# Patient Record
Sex: Male | Born: 1940 | Race: Black or African American | Hispanic: No | Marital: Single | Smoking: Former smoker
Health system: Southern US, Community
[De-identification: ages and names within clinical notes are randomized; demographics above are authoritative.]

## PROBLEM LIST (undated history)

## (undated) DIAGNOSIS — F329 Major depressive disorder, single episode, unspecified: Secondary | ICD-10-CM

## (undated) DIAGNOSIS — I10 Essential (primary) hypertension: Secondary | ICD-10-CM

## (undated) DIAGNOSIS — E785 Hyperlipidemia, unspecified: Secondary | ICD-10-CM

## (undated) DIAGNOSIS — I420 Dilated cardiomyopathy: Secondary | ICD-10-CM

## (undated) DIAGNOSIS — I472 Ventricular tachycardia, unspecified: Secondary | ICD-10-CM

## (undated) DIAGNOSIS — I82629 Acute embolism and thrombosis of deep veins of unspecified upper extremity: Secondary | ICD-10-CM

## (undated) DIAGNOSIS — G4733 Obstructive sleep apnea (adult) (pediatric): Secondary | ICD-10-CM

## (undated) DIAGNOSIS — N182 Chronic kidney disease, stage 2 (mild): Secondary | ICD-10-CM

## (undated) DIAGNOSIS — M1990 Unspecified osteoarthritis, unspecified site: Secondary | ICD-10-CM

## (undated) DIAGNOSIS — E119 Type 2 diabetes mellitus without complications: Secondary | ICD-10-CM

## (undated) DIAGNOSIS — F32A Depression, unspecified: Secondary | ICD-10-CM

## (undated) HISTORY — DX: Essential (primary) hypertension: I10

## (undated) HISTORY — DX: Obstructive sleep apnea (adult) (pediatric): G47.33

## (undated) HISTORY — PX: CARPAL TUNNEL RELEASE: SHX101

## (undated) HISTORY — PX: CATARACT EXTRACTION W/ INTRAOCULAR LENS  IMPLANT, BILATERAL: SHX1307

## (undated) HISTORY — DX: Hyperlipidemia, unspecified: E78.5

## (undated) HISTORY — PX: TOTAL HIP ARTHROPLASTY: SHX124

## (undated) HISTORY — DX: Chronic kidney disease, stage 2 (mild): N18.2

## (undated) HISTORY — PX: BUNIONECTOMY: SHX129

## (undated) HISTORY — DX: Dilated cardiomyopathy: I42.0

## (undated) HISTORY — PX: TONSILLECTOMY AND ADENOIDECTOMY: SUR1326

## (undated) HISTORY — DX: Type 2 diabetes mellitus without complications: E11.9

## (undated) HISTORY — PX: TOTAL KNEE ARTHROPLASTY: SHX125

---
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
## 2002-11-22 ENCOUNTER — Inpatient Hospital Stay (HOSPITAL_COMMUNITY): Admission: RE | Admit: 2002-11-22 | Discharge: 2002-11-23 | Payer: Self-pay | Admitting: Cardiology

## 2003-11-20 ENCOUNTER — Inpatient Hospital Stay (HOSPITAL_COMMUNITY): Admission: RE | Admit: 2003-11-20 | Discharge: 2003-11-24 | Payer: Self-pay | Admitting: Orthopedic Surgery

## 2003-11-20 ENCOUNTER — Ambulatory Visit: Payer: Self-pay | Admitting: Physical Medicine & Rehabilitation

## 2003-11-24 ENCOUNTER — Ambulatory Visit: Payer: Self-pay | Admitting: Physical Medicine & Rehabilitation

## 2003-11-24 ENCOUNTER — Inpatient Hospital Stay
Admission: RE | Admit: 2003-11-24 | Discharge: 2003-11-30 | Payer: Self-pay | Admitting: Physical Medicine & Rehabilitation

## 2003-12-11 ENCOUNTER — Ambulatory Visit: Payer: Self-pay | Admitting: Cardiology

## 2004-02-05 ENCOUNTER — Ambulatory Visit: Payer: Self-pay | Admitting: Cardiology

## 2004-03-06 ENCOUNTER — Ambulatory Visit: Payer: Self-pay | Admitting: Internal Medicine

## 2004-03-06 ENCOUNTER — Inpatient Hospital Stay (HOSPITAL_COMMUNITY): Admission: RE | Admit: 2004-03-06 | Discharge: 2004-03-14 | Payer: Self-pay | Admitting: Orthopedic Surgery

## 2004-05-08 ENCOUNTER — Ambulatory Visit: Payer: Self-pay | Admitting: Cardiology

## 2005-01-06 ENCOUNTER — Ambulatory Visit: Payer: Self-pay | Admitting: Cardiology

## 2005-01-16 ENCOUNTER — Ambulatory Visit: Payer: Self-pay | Admitting: Cardiology

## 2005-01-17 ENCOUNTER — Ambulatory Visit: Payer: Self-pay | Admitting: Cardiology

## 2005-02-10 ENCOUNTER — Ambulatory Visit: Payer: Self-pay | Admitting: Cardiology

## 2005-11-15 ENCOUNTER — Encounter: Payer: Self-pay | Admitting: Cardiology

## 2005-11-26 ENCOUNTER — Ambulatory Visit: Payer: Self-pay | Admitting: Cardiology

## 2006-09-16 ENCOUNTER — Ambulatory Visit: Payer: Self-pay | Admitting: Cardiology

## 2006-09-30 ENCOUNTER — Ambulatory Visit: Payer: Self-pay | Admitting: Cardiology

## 2007-11-19 ENCOUNTER — Ambulatory Visit: Payer: Self-pay | Admitting: Cardiology

## 2008-05-16 ENCOUNTER — Ambulatory Visit: Payer: Self-pay | Admitting: Cardiology

## 2008-05-25 ENCOUNTER — Ambulatory Visit: Payer: Self-pay | Admitting: Cardiology

## 2008-05-29 ENCOUNTER — Encounter: Payer: Self-pay | Admitting: Cardiology

## 2008-05-31 ENCOUNTER — Encounter: Payer: Self-pay | Admitting: Cardiology

## 2008-11-30 ENCOUNTER — Encounter (INDEPENDENT_AMBULATORY_CARE_PROVIDER_SITE_OTHER): Payer: Self-pay | Admitting: *Deleted

## 2008-11-30 ENCOUNTER — Ambulatory Visit (HOSPITAL_COMMUNITY): Admission: RE | Admit: 2008-11-30 | Discharge: 2008-11-30 | Payer: Self-pay | Admitting: Ophthalmology

## 2008-12-01 ENCOUNTER — Ambulatory Visit: Payer: Self-pay | Admitting: Cardiology

## 2008-12-01 DIAGNOSIS — M109 Gout, unspecified: Secondary | ICD-10-CM

## 2008-12-01 DIAGNOSIS — I428 Other cardiomyopathies: Secondary | ICD-10-CM

## 2008-12-01 DIAGNOSIS — I10 Essential (primary) hypertension: Secondary | ICD-10-CM

## 2008-12-01 DIAGNOSIS — N182 Chronic kidney disease, stage 2 (mild): Secondary | ICD-10-CM

## 2008-12-01 DIAGNOSIS — E785 Hyperlipidemia, unspecified: Secondary | ICD-10-CM

## 2008-12-01 DIAGNOSIS — G4733 Obstructive sleep apnea (adult) (pediatric): Secondary | ICD-10-CM

## 2009-02-08 ENCOUNTER — Ambulatory Visit (HOSPITAL_COMMUNITY): Admission: RE | Admit: 2009-02-08 | Discharge: 2009-02-08 | Payer: Self-pay | Admitting: Ophthalmology

## 2009-06-08 ENCOUNTER — Ambulatory Visit: Payer: Self-pay | Admitting: Cardiology

## 2009-06-08 DIAGNOSIS — J309 Allergic rhinitis, unspecified: Secondary | ICD-10-CM | POA: Insufficient documentation

## 2009-07-29 ENCOUNTER — Encounter: Payer: Self-pay | Admitting: Cardiology

## 2009-08-03 ENCOUNTER — Encounter: Payer: Self-pay | Admitting: Cardiology

## 2009-12-06 ENCOUNTER — Ambulatory Visit: Payer: Self-pay | Admitting: Cardiology

## 2009-12-06 DIAGNOSIS — I5022 Chronic systolic (congestive) heart failure: Secondary | ICD-10-CM

## 2010-01-20 DIAGNOSIS — I82629 Acute embolism and thrombosis of deep veins of unspecified upper extremity: Secondary | ICD-10-CM

## 2010-01-20 HISTORY — DX: Acute embolism and thrombosis of deep veins of unspecified upper extremity: I82.629

## 2010-02-19 NOTE — Letter (Signed)
Summary: [facility] D/C DR. Beatrix Fetters [facility]  [facility] D/C DR. Kirstie Peri   Imported By: Zachary George 12/06/2009 13:35:29  _____________________________________________________________________  External Attachment:    Type:   Image     Comment:   External Document

## 2010-02-19 NOTE — Assessment & Plan Note (Signed)
Summary: 6 mo ful fholt   Visit Type:  Follow-up Primary Provider:  Sherryll Burger   History of Present Illness: patient presents for a six-month followup.  Since last seen, he suffered a fall this past July, resulting in left pelvis fracture, treated conservatively. He denied any syncope.  Clinically, he has lost 4 pounds since his last visit. He denies dietary sodium indiscretion, but does drink water freely. He denies any significant exacerbation from baseline level of exercise tolerance. His symptoms are consistent with NYHA class II heart failure.  Patient remains noncompliant with CPAP.  Preventive Screening-Counseling & Management  Alcohol-Tobacco     Smoking Status: quit     Year Quit: 1984  Current Medications (verified): 1)  Coreg 25 Mg Tabs (Carvedilol) .... Take 1 Tablet By Mouth Twice A Day 2)  Lasix 40 Mg Tabs (Furosemide) .... Take 1 Tablet By Mouth Every Morning (Please Deliver To Home) 3)  Lisinopril 40 Mg Tabs (Lisinopril) .... Take 1 Tablet By Mouth Once A Day 4)  Pravastatin Sodium 40 Mg Tabs (Pravastatin Sodium) .... Take 1 Tablet By Mouth Once A Day 5)  Aspirin 81 Mg Tbec (Aspirin) .... Take One Tablet By Mouth Daily 6)  Cymbalta 30 Mg Cpep (Duloxetine Hcl) .... Take 1 Tablet By Mouth Once A Day 7)  Avodart 0.5 Mg Caps (Dutasteride) .... Take 1 Capsule By Mouth Once A Day 8)  Antacid Anti-Gas 200-200-20 Mg/51ml Susp (Alum & Mag Hydroxide-Simeth) .... Take As Needed 9)  Colcrys 0.6 Mg Tabs (Colchicine) .... Take 1 Tablet By Mouth Two Times A Day As Needed 10)  Cetirizine Hcl 10 Mg Tabs (Cetirizine Hcl) .... Take 1 Tablet By Mouth Once A Day 11)  Norvasc 10 Mg Tabs (Amlodipine Besylate) .... Take 1 Tablet By Mouth Once A Day 12)  Vicodin 5-500 Mg Tabs (Hydrocodone-Acetaminophen) .... Take 1 Tablet By Mouth Two Times A Day As Needed  Allergies (verified): 1)  ! Coumadin  Comments:  Nurse/Medical Assistant: The patient's medication bottles and allergies were reviewed  with the patient and were updated in the Medication and Allergy Lists.  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   mild renal insufficiency status post catheterization in 2004 nonobstructive disease. Hyperkalemia secondary to medications mild to insufficiency hypertension obstructive sleep apnea Exline dyslipidemia  1. 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 2. Mild renal insufficiency. 3. The patient had normal catheterization in 2004. 4. Possible false positive Cardiolite stress study recently. 5. Hypertension controlled. 6. Obstructive sleep apnea. 7. Dyslipidemia. 8. Borderline diabetes mellitus.   Review of Systems       No fevers, chills, hemoptysis, dysphagia, melena, hematocheezia, hematuria, rash, claudication, orthopnea, pnd, pedal edema. All other systems negative.   Vital Signs:  Patient profile:   70 year old male Height:      72 inches Weight:      344 pounds BMI:     46.82 Pulse rate:   79 / minute BP sitting:   103 / 70  (left arm) Cuff size:   large  Vitals Entered By: Carlye Grippe (December 06, 2009 10:17 AM)  Nutrition Counseling: Patient's BMI is greater than 25 and therefore counseled on weight management options.  Physical Exam  Additional Exam:  GEN: 70 year old male, morbidly obese, sitting upright, no distress HEENT: NCAT,PERRLA,EOMI NECK: palpable pulses, no bruits; unable to assess JVD, secondary to neck girth LUNGS: diminished breath sounds, but  no crackles or wheezes HEART: RRR (S1S2); no significant murmurs; no rubs; no gallops ABD: soft, NT; intact BS EXT: 1+ peripheral, nonpitting edema SKIN: warm, dry MUSC: no obvious deformity NEURO: A/O (x3)     Impression & Recommendations:  Problem # 1:  CARDIOMYOPATHY, DILATED (ICD-425.4)  patient is euvolemic by clinical history and  presentation, with symptoms suggestive of NYHA class II heart failure. He has lost 4 pounds since his last visit. Most recent echo indicated stable moderate LVD (EF 35-40%). Continue current diuretic regimen. I also recommended cardiac rehabilitation, from which I feel he would greatly benefit. He said he would consider this.  Problem # 2:  ESSENTIAL HYPERTENSION, BENIGN (ICD-401.1)  much improved, following addition of amlodipine, by Dr. Andee Lineman, at time of last visit.  Problem # 3:  OBSTRUCTIVE SLEEP APNEA (ICD-327.23)  admits to remaining noncompliant with CPAP.  Other Orders: EKG w/ Interpretation (93000) Cardiac Rehabilitation (Cardiac Rehab)  Patient Instructions: 1)  Cardiac Rehab 2)  weight daily 3)  no added salt 4)  Follow up in  6 months

## 2010-02-19 NOTE — Assessment & Plan Note (Signed)
Summary: 6 mt followup.rcm   Visit Type:  Follow-up Primary Provider:  Sherryll Burger   History of Present Illness: the patient is a 70 year old male with a history of moderate coronary artery disease.  He has a dilated cardiomyopathy.  His ejection fraction is 35 to 40%.  He had a recent echocardiogram and his ejection fraction remains stable.  He is currently in NYHA class two.  He remains grossly overweight.  He also remains noncompliant with the CPAP device.  He has significant sleep apnea.  He complains of sinus drainage and muscle pains.  From a cardiac standpoint however he remained stable.  Unfortunately blood pressure again is very elevated.  The latter is likely contravening to his nonischemic cardiomyopathy.  He reports no orthopnea PND he has no palpitations or syncope  Preventive Screening-Counseling & Management  Alcohol-Tobacco     Smoking Status: quit     Year Quit: 1984  Current Medications (verified): 1)  Coreg 25 Mg Tabs (Carvedilol) .... Take 1 Tablet By Mouth Twice A Day 2)  Lasix 40 Mg Tabs (Furosemide) .... Take 1 Tablet By Mouth Every Morning (Please Deliver To Home) 3)  Lisinopril 40 Mg Tabs (Lisinopril) .... Take 1 Tablet By Mouth Once A Day 4)  Pravastatin Sodium 40 Mg Tabs (Pravastatin Sodium) .... Take 1 Tablet By Mouth Once A Day 5)  Aspirin 81 Mg Tbec (Aspirin) .... Take One Tablet By Mouth Daily 6)  Cymbalta 30 Mg Cpep (Duloxetine Hcl) .... Take 1 Tablet By Mouth Once A Day 7)  Avodart 0.5 Mg Caps (Dutasteride) .... Take 1 Capsule By Mouth Once A Day 8)  Antacid Anti-Gas 200-200-20 Mg/28ml Susp (Alum & Mag Hydroxide-Simeth) .... Take As Needed 9)  Colcrys 0.6 Mg Tabs (Colchicine) .... Take 1 Tablet By Mouth Two Times A Day As Needed 10)  Cetirizine Hcl 10 Mg Tabs (Cetirizine Hcl) .... Take 1 Tablet By Mouth Once A Day 11)  Zyrtec Allergy 10 Mg Caps (Cetirizine Hcl) .... Take 1 Tablet By Mouth Once A Day 12)  Norvasc 10 Mg Tabs (Amlodipine Besylate) .... Take 1 Tablet  By Mouth Once A Day  Allergies (verified): 1)  ! Coumadin  Comments:  Nurse/Medical Assistant: The patient's medications and allergies were reviewed with the patient and were updated in the Medication and Allergy Lists. List reviewed.  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   mild renal insufficiency status post catheterization in 2004 nonobstructive disease. Hyperkalemia secondary to medications mild to insufficiency hypertension obstructive sleep apnea Exline dyslipidemia  1. 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 2. Mild renal insufficiency. 3. The patient had normal catheterization in 2004. 4. Possible false positive Cardiolite stress study recently. 5. Hypertension controlled. 6. Obstructive sleep apnea. 7. Dyslipidemia. 8. Borderline diabetes mellitus.   Past Surgical History: Last updated: 11/18/2007 appendectomy, bunionectomy, carpal tunnel surgery of right hand, left hip replacement.  Family History: Last updated: 11/18/2007 Negative FH of Diabetes, Hypertension, or Coronary Artery Disease  Social History: Last updated: 11/18/2007 Tobacco Use - No.   Social History: Smoking Status:  quit  Review of Systems       The patient complains of shortness of breath and leg swelling.  The patient denies fatigue, malaise, fever, weight gain/loss, vision loss, decreased hearing, hoarseness, chest pain, palpitations, prolonged cough, wheezing, sleep apnea, coughing up blood, abdominal pain, blood in stool, nausea, vomiting, diarrhea, heartburn, incontinence, blood in urine,  muscle weakness, joint pain, rash, skin lesions, headache, fainting, dizziness, depression, anxiety, enlarged lymph nodes, easy bruising or bleeding, and environmental allergies.    Vital Signs:  Patient profile:   70 year old male Height:      72  inches Weight:      348 pounds O2 Sat:      96 % Pulse rate:   64 / minute BP sitting:   151 / 100  (left arm) Cuff size:   large  Vitals Entered By: Carlye Grippe (Jun 08, 2009 10:04 AM)  Physical Exam  Additional Exam:  General: Well-developed, well-nourished in no distress head: Normocephalic and atraumatic eyes PERRLA/EOMI intact, conjunctiva and lids normal nose: No deformity or lesions mouth normal dentition, normal posterior pharynx neck: Supple, no JVD.  No masses, thyromegaly or abnormal cervical nodes lungs: Normal breath sounds bilaterally without wheezing.  Normal percussion heart: regular rate and rhythm with normal S1 and S2, no S3 or S4.  PMI is normal.  No pathological murmurs abdomen: Normal bowel sounds, abdomen is soft and nontender without masses, organomegaly or hernias noted.  No hepatosplenomegaly musculoskeletal: Back normal, normal gait muscle strength and tone normal pulsus: Pulse is normal in all 4 extremities Extremities: No peripheral pitting edema neurologic: Alert and oriented x 3 skin: Intact without lesions or rashes cervical nodes: No significant adenopathy psychologic: Normal affect    Impression & Recommendations:  Problem # 1:  OBSTRUCTIVE SLEEP APNEA (ICD-327.23) noncompliant with CPAP  Problem # 2:  CARDIOMYOPATHY, DILATED (ICD-425.4) ejection fraction remains stable at 35 to 40%.  No clear indication for ICD.  Particular because the patient blood pressure remains elevated and is atargets for  for treatment The following medications were removed from the medication list:    Aspir-low 81 Mg Tbec (Aspirin) .Marland Kitchen... Take 1 tab daily His updated medication list for this problem includes:    Coreg 25 Mg Tabs (Carvedilol) .Marland Kitchen... Take 1 tablet by mouth twice a day    Lasix 40 Mg Tabs (Furosemide) .Marland Kitchen... Take 1 tablet by mouth every morning (please deliver to home)    Lisinopril 40 Mg Tabs (Lisinopril) .Marland Kitchen... Take 1 tablet by mouth once a day     Aspirin 81 Mg Tbec (Aspirin) .Marland Kitchen... Take one tablet by mouth daily    Norvasc 10 Mg Tabs (Amlodipine besylate) .Marland Kitchen... Take 1 tablet by mouth once a day  Problem # 3:  ESSENTIAL HYPERTENSION, BENIGN (ICD-401.1) I added Norvasc 10 mg p.o. daily to his medical regimen. The following medications were removed from the medication list:    Aspir-low 81 Mg Tbec (Aspirin) .Marland Kitchen... Take 1 tab daily His updated medication list for this problem includes:    Coreg 25 Mg Tabs (Carvedilol) .Marland Kitchen... Take 1 tablet by mouth twice a day    Lasix 40 Mg Tabs (Furosemide) .Marland Kitchen... Take 1 tablet by mouth every morning (please deliver to home)    Lisinopril 40 Mg Tabs (Lisinopril) .Marland Kitchen... Take 1 tablet by mouth once a day    Aspirin 81 Mg Tbec (Aspirin) .Marland Kitchen... Take one tablet by mouth daily    Norvasc 10 Mg Tabs (Amlodipine besylate) .Marland Kitchen... Take 1 tablet by mouth once a day  Problem # 4:  ALLERGIC RHINITIS (ICD-477.9) I recommended over-the-counter Zyrtec.  Patient Instructions: 1)  Zyrtec 10mg  daily 2)  Norvasc 10mg  daily 3)  Follow up in  6 months Prescriptions: ZYRTEC ALLERGY 10 MG CAPS (CETIRIZINE HCL) Take 1 tablet by mouth once a day  #30 x 6  Entered by:   Hoover Brunette, LPN   Authorized by:   Lewayne Bunting, MD, [facility]   Signed by:   Hoover Brunette, LPN on 11/91/4782   Method used:   Electronically to        [facility] [address] (retail)       [address]       [place], [place]  [address]       Ph: [phone] or [phone]       Fax: [phone]   RxID:   [id number] NORVASC 10 MG TABS (AMLODIPINE BESYLATE) Take 1 tablet by mouth once a day  #30 x 6   Entered by:   Hoover Brunette, LPN   Authorized by:   Lewayne Bunting, MD, [facility]   Signed by:   Hoover Brunette, LPN on 75/64/3329   Method used:   Electronically to        [facility] [address] (retail)       [address]       [place], [place]  [address]       Ph: [phone] or [phone]       Fax:  [phone]   RxID:   [id number]

## 2010-04-07 LAB — BASIC METABOLIC PANEL
BUN: 20 mg/dL (ref 6–23)
CO2: 30 mEq/L (ref 19–32)
Calcium: 9.3 mg/dL (ref 8.4–10.5)
Chloride: 102 mEq/L (ref 96–112)
Creatinine, Ser: 1.32 mg/dL (ref 0.4–1.5)
GFR calc Af Amer: >60 mL/min (ref >60)
GFR calc non Af Amer: 54 mL/min — ABNORMAL LOW (ref >60)
Glucose, Bld: 104 mg/dL — ABNORMAL HIGH (ref 70–99)
Potassium: 4.2 mEq/L (ref 3.5–5.1)
Sodium: 143 mEq/L (ref 135–145)

## 2010-04-07 LAB — GLUCOSE, CAPILLARY: Glucose-Capillary: 111 mg/dL — ABNORMAL HIGH (ref 70–99)

## 2010-04-24 LAB — GLUCOSE, CAPILLARY: Glucose-Capillary: 135 mg/dL — ABNORMAL HIGH (ref 70–99)

## 2010-04-24 LAB — HEMOGLOBIN AND HEMATOCRIT, BLOOD
HCT: 39.8 % (ref 39.0–52.0)
Hemoglobin: 13.4 g/dL (ref 13.0–17.0)

## 2010-04-24 LAB — BASIC METABOLIC PANEL
BUN: 19 mg/dL (ref 6–23)
CO2: 27 mEq/L (ref 19–32)
Calcium: 9 mg/dL (ref 8.4–10.5)
Chloride: 107 mEq/L (ref 96–112)
Creatinine, Ser: 1.18 mg/dL (ref 0.4–1.5)
GFR calc Af Amer: >60 mL/min (ref >60)
GFR calc non Af Amer: >60 mL/min (ref >60)
Glucose, Bld: 140 mg/dL — ABNORMAL HIGH (ref 70–99)
Potassium: 3.9 mEq/L (ref 3.5–5.1)
Sodium: 140 mEq/L (ref 135–145)

## 2010-06-04 NOTE — Assessment & Plan Note (Signed)
[facility]                          [facility] OFFICE NOTE   [name], [name]                      MRN:          [id number]  DATE:05/16/2008                            DOB:          11-07-1940    HISTORY OF PRESENT ILLNESS:  The patient is a 70 year old male with a  history of nonischemic dilated cardiomyopathy.  The patient had an  catheterization in 2004 with nonobstructive coronary artery disease.  The patient recently underwent stress testing in Dr. Margaretmary Eddy office for  routine evaluation.  The patient did not report any chest pain or  worsening shortness of breath.  Unfortunately, there was a small area of  inferoseptal ischemia with an ejection fraction of 39%.  However, this  patient is very large and is large chested and is very likely to have  had a false positive study.  The patient did gain weight of 10 pounds.  He denies any chest pain on exertion or at rest.  He has also no  orthopnea, PND, palpitations, or syncope.  Clinically, there has been no  change in his cardiovascular status since his last office visit.   MEDICATIONS:  1. Zyrtec 10 mg p.o. daily.  2. Coreg 25 mg p.o. b.i.d.  3. Aspirin 81 mg p.o. daily.  4. Cymbalta 30 mg p.o. daily.  5. Lasix 40 mg half a tablet p.o. daily.  6. Avodart 0.5 mg p.o. daily.  7. Pravastatin 40 mg p.o. daily.  8. Lisinopril 40 mg p.o. daily.   PHYSICAL EXAMINATION:  VITAL SIGNS:  Blood pressure is 137/91 with a  heart rate of 90 beats and he weights 324 pounds.  GENERAL:  Overweight African American male, but in no apparent distress.  HEENT:  Pupils are isocoric.  Conjunctivae are clear.  NECK:  Supple.  Normal carotid upstroke and no carotid bruits.  LUNGS:  Clear breath sounds bilaterally.  HEART:  Regular rate and rhythm.  Normal S1 and S2.  No murmur, rubs, or  gallops.  ABDOMEN:  Soft and nontender.  No rebound or guarding.  Good bowel  sounds.  EXTREMITIES:  No cyanosis,  clubbing, or edema.  NEURO:  The patient is alert, oriented, and grossly nonfocal.   ASSESSMENT:  1. 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  2. Mild renal insufficiency.  3. The patient had normal catheterization in 2004.  4. Possible false positive Cardiolite stress study recently.  5. Hypertension controlled.  6. Obstructive sleep apnea.  7. Dyslipidemia.  8. Borderline diabetes mellitus.   PLAN:  1. I suspect the patient had a false positive Cardiolite study.  I do      not think there is enough evidence that the patient has ischemia      that should warrant a cardiac catheterization.  2. He does appear to be somewhat volume overload and I will increase      his Lasix to 40 mg p.o. daily,  3. We will repeat an echocardiographic study in particular if the      ejection fraction remains above 45% or equal to 45%.  I do not  think a catheterization is indicated with ongoing medical therapy.     Learta Codding, MD,FACC  Electronically Signed    GED/MedQ  DD: 05/16/2008  DT: 05/17/2008  Job #: [id number]   cc:   Kirstie Peri, MD

## 2010-06-04 NOTE — Assessment & Plan Note (Signed)
[facility]                          [facility]ICE NOTE   [name], [name]                      MRN:          [id number]  DATE:09/16/2006                            DOB:          [birth year]    REFERRING PHYSICIAN:  Atilano Median, MD   HISTORY OF PRESENT ILLNESS:  The patient is a 70 year old male with a  history of nonischemic cardiomyopathy.  The patient has been doing well.  He reports no recurrent substernal chest pressure.  The patient had a  prior cardiac catheterization performed in 2004 which showed no  significant coronary artery disease.  The patient is compliant with his  medical regimen and is currently NYHA class II.  The patient states that  he has gained quite a bit of weight, which he does not attribute to  fluids, but rather due to increased appetite.  The patient needs refills  on his medications today.   MEDICATIONS:  1. Zyrtec 10 mg p.o. daily.  2. Coreg 25 mg b.i.d.  3. Aspirin 81 mg a day.  4. Cymbalta 30 mg daily.  5. Lasix 40 mg half a tab p.o. daily.  6. Lipitor 10 mg daily.  7. Lisinopril 20 mg daily.  8. Avodart 0.5 mg daily.  9. Reglan 10 mg p.o. b.i.d.   PHYSICAL EXAMINATION:  VITAL SIGNS:  Blood pressure 110/64, heart rate  82.  Weight is 301 pounds.  NECK:  Normal carotid upstroke and no carotid bruits.  LUNGS:  Clear breath sounds bilaterally.  HEART:  Regular rate and rhythm with a normal S1 and S2.  No murmurs,  rubs, or gallops.  ABDOMEN:  Soft and nontender.  Good bowel sounds.  EXTREMITIES:  No cyanosis, clubbing or edema.  NEUROLOGIC:  The patient is alert, oriented and grossly nonfocal.   PROBLEM LIST:  1. Nonischemic dilated cardiomyopathy.      a.     Ejection fraction 25% to 30%.      b.     Status post catheterization in 2004, nonobstructive disease.      c.     Normal right ventricular function.  2. Mild renal insufficiency.  3. Hypertension.  4. Obstructive sleep apnea.  5.  Dyslipidemia.  6. History of knee replacement.   PLAN:  1. The patient's EKG in the office was essentially within normal      limits.  2. The patient has no overt symptoms of heart failure and he is      complying with his medical regimen.  We will refill his      medications.  3. The patient will have a followup echocardiographic study done to      reassess his ejection fraction.     Learta Codding, MD,FACC  Electronically Signed    GED/MedQ  DD: 09/16/2006  DT: 09/17/2006  Job #: [id number]   cc:   Atilano Median, MD

## 2010-06-04 NOTE — Assessment & Plan Note (Signed)
[facility]                          [facility]ICE NOTE   [name], [name]                      MRN:          [id number]  DATE:11/19/2007                            DOB:          1940-06-22    REFERRING PHYSICIAN:  Dr. Sherryll Burger   REFERRING PHYSICIAN:  Dr. Sherryll Burger.   HISTORY OF PRESENT ILLNESS:  The patient is a 70 year old male with a  history of nonischemic dilated cardiomyopathy.  In 2004, his ejection  fraction was 25-30%, but that improved to 55% in 2008, and the recent  echocardiographic study obtained in Dr. Margaretmary Eddy office, his ejection  fraction was 45%.  There were no significant valvular abnormalities.  From the clinical standpoint, the patient is actually doing quite well.  He has no chest pain and does have some increased shortness of breath on  exertion, but this may well relate to his significant weight gain and  deconditioning.  The patient denies any chest pain.  He has no  palpitations or syncope.  He has been diagnosed by Dr. Sherryll Burger with  borderline diabetes mellitus.   MEDICATIONS:  1. Zyrtec 10 mg p.o. daily.  2. Coreg 25 mg p.o. b.i.d.  3. Aspirin 81 mg p.o. daily.  4. Cymbalta 30 mg p.o. daily.  5. Lasix 40 mg p.o. daily.  6. Lisinopril 20 mg p.o. daily.  7. Avodart 0.5 mg p.o. daily.  8. Reglan 10 mg p.o. b.i.d.  9. Pravastatin 40 mg p.o. daily.   PHYSICAL EXAMINATION:  VITAL SIGNS:  Blood pressure 133/87, heart rate  70 beats per minute, and weight is 314 pounds.  NECK:  Normal carotid upstroke and no carotid bruits.  LUNGS:  Clear breath sounds bilaterally.  HEART:  Regular rate and rhythm.  Normal S1 and S2.  Heart sounds were  distant.  There were no bruits.  ABDOMEN:  Soft and nontender.  No rebound or guarding.  Good bowel  sounds.  EXTREMITIES:  No cyanosis, clubbing, or edema.  NEURO:  The patient is alert, oriented, and grossly nonfocal.   PROBLEMS:  1. Nonischemic dilated cardiomyopathy, current  ejection fraction of      45%.  2. Mild renal insufficiency.  3. Status post catheterization in 2004 with nonobstructive coronary      artery disease.  4. Hyperkalemia secondary to medications (no further details      available).  5. Hypertension.  6. Obstructive sleep apnea, dyslipidemia, and borderline diabetes      mellitus.   PLAN:  1. From cardiovascular standpoint, the patient is doing quite well.      We reviewed his EKG in the office today which demonstrated a normal      sinus rhythm, somewhat poor R-wave progression, but he has a      nonischemic cardiomyopathy.  I suspect this is related to the      patient's size.  2. I stressed to the patient the aggressive risk factor modification,      particularly his weight gain is concerning and the fact that he now      is in borderline diabetic.  We will  need aggressive lipid control      and I will leave this up to Dr. Sherryll Burger.  3. Otherwise, make no changes in the patient's medical regimen as he      has no evidence of worsening heart failure.     Learta Codding, MD,FACC  Electronically Signed    GED/MedQ  DD: 11/21/2007  DT: 11/21/2007  Job #: [id number]

## 2010-06-07 NOTE — Assessment & Plan Note (Signed)
[facility]                            [facility] OFFICE NOTE   GRIFFEN, FRAYNE                      MRN:          [id number]  DATE:11/26/2005                            DOB:          Jan 18, 1941    REFERRING PHYSICIAN:  [facility]   HISTORY OF PRESENT ILLNESS:  The patient is a 70 year old male with a  nonischemic cardiomyopathy.  The patient was recently admitted after he  presented with atypical chest pain to the ER.  He also had increasing blood  pressure.  He was admitted and started on Norvasc and ruled out for  myocardial infarction.  He presents for followup.  He has a known  nonischemic cardiomyopathy.  He is currently [place] HA_ class IIB.  The patient  states he has been doing well.  His blood pressure today in the office is  116/70, and the patient reports no complaints.   MEDICATIONS:  Are listed in the chart and include:  1. Aspirin 81 mg a day.  2. Lasix 40 mg a day.  3. Lisinopril 20 mg a day.  4. Coreg 25 mg p.o. b.i.d.  5. Zyrtec.  6. And the addition of Norvasc 5 mg p.o. every day.   PHYSICAL EXAMINATION:  VITAL SIGNS:  Blood pressure 116/70, heart rate 80  beats per minute.  NECK:  No carotid upstrokes.  No carotid bruits.  LUNGS:  Clear breath sounds bilaterally.  HEART:  Regular rate and rhythm.  Normal S1 S2.  ABDOMEN:  Soft.  EXTREMITIES:  No cyanosis, clubbing, or edema.  NEUROLOGIC:  The patient is alert and oriented, grossly nonfocal.   PROBLEM LIST:  1. Nonischemic dilated cardiomyopathy.      a.     Ejection fraction 25-30%.      b.     Status post catheterization 2004, mild pulmonary hypertension,       nonobstructive coronary disease.      c.     Normal right ventricle function.  2. Creatinine 1.4.  3. Hypertension.  4. Obstructive sleep apnea.  5. Dyslipidemia.  6. Status post knee replacement.   PLAN:  1. The patient will continue on his current medical regimen.  His blood  pressure is under good      control.  He can continue Norvasc.  2. The patient will be followed up in 6 months.     Learta Codding, MD,FACC  Electronically Signed    GED/MedQ  DD: 11/26/2005  DT: 11/27/2005  Job #: [id number]   cc:   Weyman Pedro

## 2010-06-07 NOTE — H&P (Signed)
[name], [name]             ACCOUNT NO.:  [id number]   MEDICAL RECORD NO.:  [id number]          PATIENT TYPE:  INP   LOCATION:  NA                           FACILITY:  [facility]   PHYSICIAN:  Ollen Gross, M.D.    DATE OF BIRTH:  July 11, 1940   DATE OF ADMISSION:  03/06/2004  DATE OF DISCHARGE:                                HISTORY & PHYSICAL   CHIEF COMPLAINT:  Right knee pain.   HISTORY OF PRESENT ILLNESS:  This is a 70 year old male who has been seen by  Dr. Despina Hick for ongoing right knee pain.  He is well known to Dr. Despina Hick,  having previously undergone a left total hip replacement arthroplasty  earlier this past fall and has done quite well with his left total hip.  He  was known to have hip pain and knee pain.  His knee pain has been in the  right knee for quite some time now.  It is interfering with his mobility.  He has fully recovered from his hip surgery and would like to pursue knee  surgery at this time.  He is seen in the office, where x-rays showed end-  stage tricompartmental arthritis, which is worse laterally.  It is felt he  would benefit from undergoing knee replacement.  Risks and benefits have  been discussed, and the patient is subsequently admitted to surgery.   ALLERGIES:  No known drug allergies.   INTOLERANCES:  COUMADIN sensitivity, which causes bruising, and there was  some question of itching.  He does understand that if he does need this for  DVT prophylaxis, that he would tolerate it and take Benadryl and some type  of other medication.   CURRENT MEDICATIONS:  1.  Coreg 25 mg 1/2 tablet twice daily.  2.  Avodart 0.5 mg.  3.  Lipitor 10 mg.  4.  Lisinopril 20 mg.  5.  Protonix 40 mg.  6.  Darvocet-N 100 p.r.n.  7.  Atarax 25 mg p.r.n.  8.  Methocarbamol 500 mg.  9.  Lasix.   Patient has stopped his Flonase and also his aspirin before surgery.   PAST MEDICAL HISTORY:  1.  Obesity.  2.  Sleep apnea, which he uses a CPAP machine.  3.   Hypertension.  4.  Reflux disease.  5.  History of bradycardia.  6.  History of anemia.  7.  Nonischemic dilated cardiomyopathy.  8.  Dyslipidemia.   PAST SURGICAL HISTORY:  1.  Appendectomy.  2.  Carpal tunnel release, right hand.  3.  Right bunion surgery.  4.  Left total hip replacement.  5.  Cardiac catheterization.   SOCIAL HISTORY:  Patient is single.  Disabled.  Nonsmoker.  No alcohol.  He  did quit smoking approximately 21 years ago.   FAMILY HISTORY:  Mother with a history of CVA.  Father with a history of  cancer.  Aunt with a history of cancer.  Brother with a history of heart  disease and diabetes.   REVIEW OF SYSTEMS:  GENERAL:  No fevers, chills, night sweats.  NEURO:  No  seizures, syncope, paralysis.  RESPIRATORY:  No shortness of breath,  productive cough, or hemoptysis.  CARDIOVASCULAR:  No chest pain, angina,  orthopnea.  GI:  No nausea or vomiting.  He does have some intermittent  diarrhea.  No constipation.  No bloody mucus in the stool.  GU:  He does  have a little bit of nocturia and frequency.  No dysuria, hematuria, or  discharge.  MUSCULOSKELETAL:  Right knee, found in the history of present  illness.   PHYSICAL EXAMINATION:  VITAL SIGNS:  Pulse 64, respirations 12, blood  pressure 119/62.  GENERAL:  A 70 year old African-American male, large frame.  Overweight,  obese.  No acute distress.  He is alert, oriented and cooperative.  Very  pleasant.  HEENT:  Normocephalic and atraumatic.  Pupils are round and reactive.  Oropharynx is clear.  EOMs are intact.  NECK:  Faint bruit on the left.  None appreciated on the right.  Neck is  supple.  CHEST:  Clear anterior and posterior chest wall.  No rales, rhonchi or  wheezes.  He is somewhat of a barrel-chested individual.  HEART:  Regular rhythm.  No murmurs.  S1 and S2 noted.  ABDOMEN:  Soft, round, protuberant.  Bowel sounds present.  RECTAL/BREASTS/GENITALIA:  Not done.  Not pertinent to the present  illness.  EXTREMITIES:  Right knee shows significant valgus deformity.  Malalignment.  Range of motion from 5 to 115 degrees.  Marked crepitus on passive range of  motion.  Slight effusion.   IMPRESSION:  1.  Osteoarthritis, right knee.  2.  Obesity.  3.  Sleep apnea.  Currently uses CPAP.  4.  History of bradycardia.  5.  Reflux disease.  6.  Hypertension.  7.  Nonischemic dilated cardiomyopathy.  8.  Dyslipidemia.   PLAN:  Patient admitted to [facility] to undergo a right total  knee arthroplasty.  Surgery will be performed by Dr. Trudee Grip.  His  medical doctor is Dr. Doyne Keel.  His heart doctor is Dr. Andee Lineman.  Both will  be notified of the room number, and they will be consulted if needed for  medical assistance with the patient throughout the hospital course.      ALP/MEDQ  D:  03/05/2004  T:  03/05/2004  Job:  [id number]   cc:   Learta Codding, M.D. [facility]   Ollen Gross, M.D.  [facility]  [address]  [address]  [place]  [place] [address]  Fax: [phone]   Forrest Moron

## 2010-06-07 NOTE — Op Note (Signed)
[name], [name]             ACCOUNT NO.:  [id number]   MEDICAL RECORD NO.:  [id number]          PATIENT TYPE:  INP   LOCATION:  NA                           FACILITY:  [facility]   PHYSICIAN:  Ollen Gross, M.D.    DATE OF BIRTH:  07/05/1940   DATE OF PROCEDURE:  03/06/2004  DATE OF DISCHARGE:                                 OPERATIVE REPORT   PREOPERATIVE DIAGNOSIS:  Osteoarthritis, right knee, valgus deformity.   POSTOPERATIVE DIAGNOSIS:  Osteoarthritis, right knee, valgus deformity.   PROCEDURE:  Right total knee arthroplasty.   SURGEON:  Ollen Gross, M.D.   ASSISTANT:  Avel Peace, PA-C.   ANESTHESIA:  Spinal, then converted to general.   ESTIMATED BLOOD LOSS:  Minimal.   DRAINS:  Hemovac x1.   TOURNIQUET TIME:  Fifty-three minutes at 300 mmHg.   COMPLICATIONS:  None.   CONDITION:  Stable to the recovery room.   CLINICAL NOTE:  [name] is a 70 year old male with severe end-stage  arthritis of the right knee with severe valgus deformity.  He has had a  previous successful left total knee arthroplasty, and now his right knee is  the only thing that is giving him significant trouble.  He has end-stage  arthritis with valgus deformity and presents now for a total knee  arthroplasty.   PROCEDURE IN DETAIL:  After attempted administration of spinal anesthetic, a  tourniquet is placed high on his right thigh, and lower extremity prepped  and draped in the usual sterile fashion.  A standard midline incision was  made, and while making the incision, the patient began to develop  significant pain in the knee.  He subsequently converted to a general  anesthetic.  The leg is then rewrapped and esmarched, and tourniquet  reinflated to 300 mmHg.  We completed the midline incision and cut through  the skin and subcutaneous tissue to the level of the extensor mechanism.  Given his severe valgus deformity, we made a lateral parapatellar arthrotomy  and elevated the soft  tissue over the proximal lateral tibia with a knife,  then everted the patella medially and flexed the knee to 90 degrees.  We  removed intercondylar osteophytes to get the PCL out.  The drill was then  used to create a starting hole, and the distal femoral canal is thoroughly  irrigated.  A 5 degree right valgus alignment guide is placed and the block  pin to remove 10 mm off the distal femur.  Distal femoral resection is made  with an oscillating saw.  A sizing block is placed, and size 4 is most  appropriate.  Rotation is marked off the epicondylar axis.  The size 4  cutting block is placed, and anterior and posterior chamfer cuts are made.  We had to remove a tremendous amount of marginal osteophytes at times.   The tibia is then subluxed forward, and the menisci are removed.  An  extramedullary tibial alignment guide is placed, referencing proximally at  the medial aspect of the tibial tubercle and distally along the second  metatarsal axis and tibial crest.  A block  is pinned to make minimal  resection from the deficient lateral side.  This led to resection of  approximately 14 mm of medial bone.  Resection is made with an oscillating  saw.  The size 5 is most appropriate tibial component, and then the proximal  tibia is prepared with a modular drill and keel punch for a size 5.  Femoral  preparation is completed with the intercondylar cut for a size 4.   A size 4 posterior stabilized femoral trial with a size 5 mobile-bearing  tibial trial and a 12.5 mm posterior stabilized rotating platform insert  trial was placed.  With the 12.5, a little bit of looseness and flexion, and  we went to a 15, which had great balance with full extension all the way  down to full flexion.  The alignment was great.  It was back to neutral from  a preop of close to 20 degrees of valgus.  The patella is then everted  medially, thickness measured to be 25 mm.  Free-hand resection is taken to  15 mm.  A  38 template is placed.  Lug holes are drilled.  The trial patella  is placed, and it tracks normally.  The osteophytes are then removed off the  posterior femur with a trial in place.  All trials were removed, then the  cut-bone surfaces are prepared with pulsatile lavage.  Cement is mixed, and  once ready for implantation, a size 3 mobile-bearing tibial trial, cement is  mixed, and once ready for implantation, the size 5 mobile-bearing tibial  tray, size 4 posterior stabilized femur, and 38 patella are cemented into  place.  The patella is held with a clamp.  A trial 15 mm insert is placed.  The knee held in full extension, and all extruded cement is removed.  Once  the cement is fully hardened, then the permanent 15 mm posterior stabilized  rotating platform insert is placed into the tibial tray.  The wound is  copiously irrigated with saline solution.  The extensor mechanism closed  over a Hemovac drain with interrupted #1 PDS.  We have released the  tourniquet for a total time of 53 minutes prior to closing the arthrotomy.  The arthrotomy is left open from the superior to inferior pole of the  patella to serve as a small lateral release.  Flexion against gravity at the  time of the closure is 135 degrees.  The patella was tracking normally.  The  subcu tissues are then closed with interrupted 2-0 Vicryl and the  subcuticular with a running 4-0 Monocryl.  The incision is clean and dry.  Steri-Strips and a bulky sterile dressing applied.  The drain is then hooked  to suction.  He is placed through a knee immobilizer, awakened and  transported to recovery in stable condition.      FA/MEDQ  D:  03/06/2004  T:  03/06/2004  Job:  [id number]

## 2010-06-07 NOTE — Discharge Summary (Signed)
Mark, [name]             ACCOUNT NO.:  [id number]   MEDICAL RECORD NO.:  [id number]          PATIENT TYPE:  INP   LOCATION:  0379                         FACILITY:  [facility]   PHYSICIAN:  Ollen Gross, M.D.    DATE OF BIRTH:  08-21-1940   DATE OF ADMISSION:  03/06/2004  DATE OF DISCHARGE:  03/14/2004                                 DISCHARGE SUMMARY   ADMITTING DIAGNOSES:  1.  Osteoarthritis, right knee.  2.  Obesity.  3.  Sleep apnea, currently uses (CPAP) continuous positive airway pressure.  4.  History of bradycardia.  5.  Reflux disease.  6.  Hypertension.  7.  Nonischemic dilated cardiomyopathy.  8.  Dyslipidemia.   DISCHARGE DIAGNOSES:  1.  Osteoarthritis, right knee, with valgus deformity, status post right      total knee arthroplasty.  2.  Postoperative hypotension/intraoperative hypotension.  3.  Frequent premature ventricular contractions postoperatively, resolved.  4.  Obesity.  5.  Sleep apnea, currently uses (CPAP) continuous positive airway pressure.  6.  History of bradycardia.  7.  Reflux disease.  8.  Hypertension.  9.  Nonischemic dilated cardiomyopathy.  10. Dyslipidemia.  11. Postoperative blood loss anemia.  12. Status post transfusion without sequelae.  13. Postoperative hyponatremia, improved.   CONSULTATIONS:  Cardiology.   BRIEF HISTORY:  [name] is a 70 year old male with severe end-stage  arthritis of the right knee with severe valgus deformity. He has previously  had a left total knee arthroplasty and now presents for his right total knee  which is continuing to give him trouble.   LABORATORY DATA:  CBC on admission showed a hemoglobin of 13.8, hematocrit  of 41.4, white count 7.5; normal differential. Serial CBCs were followed.  Hemoglobin did decline down to a level of 8.1, white count went up to 10.9;  he was given 2 units of blood. Post-transfusion hemoglobin back up to 9.3.  White count did continue to go up to 12.4. Serial  CBCs were continued to be  followed. Last hemoglobin 9.5, white count had come back to a normal level  of 9.8. PT and PTT preoperatively 13.2 and 28, respectively, with INR 1.0.  Serial pro times were followed; last noted PT/INR 13.7 and 1.1. Chemistry  panel on admission: Elevated BUN of 41, elevated creatinine of 1.9; the  remaining chemistry panel within normal limits. Serial BMETs were followed.  Sodium did drop postoperatively down from 142 down to 128, back up to 136.  BUN came down from 41 to 24, creatinine came down from 1.9 to 1.5. B-type  natriuretic peptide taken on March 07, 2004, normal level at 52.8.  Urinalysis preoperatively negative. Blood group type A positive. C.  difficile toxin level taken on March 12, 2004, negative.   EKG dated March 04, 2004, normal sinus rhythm with frequent premature  ventricular complexes, otherwise normal EKG; no significant change since  last tracing, confirmed by Dr. Dietrich Pates.   HOSPITAL COURSE:  The patient admitted to [facility], taken to the  OR and underwent above procedure. The patient did have some intraoperative  hypotension and postoperative  he was transferred to step-down ICU for  monitoring. Cardiology consult was called. He had experienced some  bradycardia and PVCs. He was placed and monitored, given fluids for the  hypotension, did well through that night. Was seen on day #1, hemoglobin was  9.5, he responded to IV fluids with the hypotension and his pressure was  improving. His potassium and magnesium were checked which were normal  levels. He did not have any evidence of congestive heart failure. Once his  pressure had stabilized, the IV fluids were reduced. Hemovac drain placed at  the time of the surgery was pulled. By day #2 he was feeling a little bit  better, did have a little bit of abdominal discomfort secondary to gas and  bloating, but did not have any nausea or vomiting. Hemoglobin declined   further down to 8.1 at which time he was given blood. He did respond well  and his hemoglobin came back up. I's and O's were followed and his cardiac  issues were followed very closely by [facility]. Once he was stable,  he was transferred out of the ICU up to a telemetry floor for continued  monitoring. He did have some elevated temperature postoperatively, it even  went up to 101.2 on day #3, treated with incentive spirometer and  antipyretics. Hemoglobin was back down to around 8.7, he was given another  unit of blood, hemoglobin came back up. He was monitored very closely on the  telemetry floor and his rate was well controlled. He started to receive  therapy postoperatively once he was out of the ICU, receiving physical  therapy daily, slowing progressing with his mobility, transfers. He  unfortunately also developed some postoperative diarrhea. C. difficile toxin  level was sent off, it did prove to be negative. The Colace and his stool  softeners were discontinued. Diarrhea lasted for a couple of days but then  started to taper off and improve. Discharge planning: Social services  started working with the patient. It was felt that he was going to need some  type of skilled level for continued postoperative care. An FL2 was signed  and sent out for bed availability and bed offers. Cardiology did increase  his Coreg dosage for good rate control. It was noted on March 13, 2004,  that a bed offer did come back from [facility] in [place], [place].  The patient did accept this bed offer. He was stable from his cardiac  standpoint, his rate was under good control, there were no clinical signs of  heart failure. He was starting to get up with physical therapy. He was seen  in rounds on February 23, by Dr. Lequita Halt, doing well, no complaints and it  was decided the patient would be discharged out at that time.   DISCHARGE PLAN: 1.  The patient discharged to the [facility]  of [place] on March 14, 2004.  2.  Discharge diagnoses: Please see above.   CURRENT MEDICATIONS/DISCHARGE MEDICATIONS:  1.  Lasix 40 mg p.o. daily.  2.  Protonix 40 mg p.o. daily.  3.  Zocor 20 mg p.o. q.p.m.  4.  Avodart 0.5 mg daily.  5.  Lisinopril which is Zestril/Prinivil 10 mg p.o. daily. Hold for a      systolic pressure less than 90.  6.  Dulcolax suppository per rectum nightly p.r.n. constipation.  7.  Reglan 10 mg p.o. q.6h. p.r.n. nausea.  8.  Coreg 18.75 mg p.o. b.i.d. Hold for a systolic pressure less  than 90 or      heart rate less than 60.  9.  Tylenol 1 or 2 every 4-6 hours as needed for pain.  10. Robaxin 500 mg p.o. q.6h. p.r.n. spasm.  11. Benadryl 25 mg p.o. nightly p.r.n. sleep.  12. Percocet 1 or 2 every 4-6 hours as needed for pain.  13. Imodium 2 mg p.o. p.r.n., maximum dose 8 capsules per day.   DIET:  He needs to resume his cardiac diet.   ACTIVITY:  He is weightbearing as tolerated to the right lower extremity. He  will need to continue with physical therapy and occupational therapy at the  [facility]. He needs to be up a minimum out of bed b.i.d., ambulating  daily. Daily dressing change to the right lower extremity. May start  showering, however, do not submerge incision under the water. He needs to  continue using his CPAP machine at night. T.E.D. hose during the day,  bilateral knee-high T.E.D. hose; may have them off at night. May be  weightbearing as tolerated to the right lower extremity.   FOLLOWUP:  The patient needs to follow up in the office of [facility] in about 2 weeks following discharge; contact the office  at [phone] to arrange appointment time and transfer for followup with Dr.  Lequita Halt.   DISPOSITION:  [facility] [place].   CONDITION ON DISCHARGE:  Stable, orthopedically improved.      ALP/MEDQ  D:  03/14/2004  T:  03/14/2004  Job:  [id number]   cc:   Learta Codding, M.D. [facility]  [address]  [place]  [place] [address]  Fax: [phone]

## 2010-06-07 NOTE — H&P (Signed)
[name], [name]             ACCOUNT NO.:  [id number]   MEDICAL RECORD NO.:  [id number]          PATIENT TYPE:   LOCATION:                                 FACILITY:   PHYSICIAN:  [name], M.D.         DATE OF BIRTH:   DATE OF ADMISSION:  11/20/2003  DATE OF DISCHARGE:                                HISTORY & PHYSICAL   CHIEF COMPLAINT:  Left hip pain.   HISTORY OF PRESENT ILLNESS:  The patient is a 70 year old male who has been  seen for ongoing left hip pain.  Gives a history of a long progressive left  hip pain.  He does not recall any specific antibiotics.  The pain has gotten  worse over the quite some time now where it is to the point that it is  hurting him all the time.  The majority of this pain is in his groin going  down the thigh.  He would like to exercise for cardiac purpose but his hip  pain is preventing him from doing so.  It has gotten to the point where he  would like to have something done about it.  He is referred over to Dr.  Ollen [name] for a second opinion.  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  He is completely  bone-on-bone with collapse of the femoral head and he is about one a quarter  inches short on the left as compared to the right on radiograph.  He is  known to have end-stage arthritis and felt to benefit on undergoing a hip  replacement.  Risks and benefits discussed.  The patient is subsequently  admitted to the hospital.   ALLERGIES:  No known drug allergies.   CURRENT MEDICATIONS:  1.  Altace 5 mg q.d.  2.  Avodart 0.5 mg q.d.  3.  Flonase p.r.n.  4.  Hydrocodone p.r.n.  5.  Ranitidine 300 mg q.d.  6.  Protonix 40 mg q.d.  7.  Darvocet p.r.n.  8.  Lipitor 10 mg q.d.  9.  Coreg 25 mg b.i.d.  10. Spironolactone 25 mg q.d.  11. Aspirin 81 mg q.d.   PAST MEDICAL HISTORY:  1.  Obesity.  2.  History of sleep apnea.  Currently uses a CPAP.  3.  Bradycardia.  4.  Reflux  disease.  5.  Hypertension.  6.  Nonischemic dilated cardiomyopathy.  7.  Dyslipidemia.   PAST SURGICAL HISTORY:  1.  Appendectomy.  2.  Carpal tunnel release right hand.  3.  Right bunion surgery.  4.  Cardiac catheterization.   SOCIAL HISTORY:  The patient is single.  Denies use of alcohol products.  Quit approximately 21 years ago.  Denies the use of tobacco products, quit  approximately 21 years ago.   FAMILY HISTORY:  Mother with history of CVA.  Father with history of cancer.  Aunt with history of cancer.   REVIEW OF SYMPTOMS:  GENERAL:  No fevers, chills, or night sweats.  NEUROLOGICAL:  No seizures or paralysis.  RESPIRATORY:  He does get a little  bit of shortness of breath on exertion.  No shortness of breath at rest.  No  productive cough or hemoptysis.  CARDIOVASCULAR:  No chest pain, angina, or  orthopnea.  GASTROINTESTINAL:  No nausea, vomiting, diarrhea, or  constipation.  GENITOURINARY:  No dysuria or hematuria.  MUSCULOSKELETAL:  Pertinent of the hip found in the history of present illness.   PHYSICAL EXAMINATION:  VITAL SIGNS:  Pulse 68, respirations 14, blood  pressure 112/64.  GENERAL:  A 70 year old [place] American male well-developed, well-  nourished, overweight, and in no acute distress.  He is alert, oriented, and  cooperative.  Very pleasant.  HEENT:  Normocephalic and atraumatic.  Pupils are equal, round, and  reactive.  Oropharynx is clear.  Her EOMs are intact.  CHEST:  There are distant breath sounds her noted.  Somewhat of a barreled-  chested individual.  Otherwise, breath sounds are clear.  No rhonchi or  rales.  HEART:  Regular rate and rhythm.  No murmurs.  S1 and S2 noted.  ABDOMEN:  Soft, round abdomen.  Slightly protuberant.  Bowel sounds are  present and nontender.  BREASTS:  Not done, not pertinent to present illness.  RECTAL:  Not done, not pertinent to present illness.  GENITALIA:  Not done, not pertinent to present illness.   EXTREMITIES:  Left lower extremity:  He only has hip flexion of about 80  degrees.  There is no internal rotation, only 5 degrees of external  rotation, only 5 degrees of abduction.  Left leg is approximately 1 and 1-  1/4 inch shorter on the left as compared to the right on radiograph.   IMPRESSION:  1.  Osteoarthritis left hip.  2.  Nonischemic dilated cardiomyopathy.  3.  Hypertension.  4.  Dyslipidemia.  5.  Morbid obesity.  6.  Sleep apnea.  7.  Bradycardia.  8.  Reflux disease.   PLAN:  The patient is admitted to [facility] to undergo left total  hip replacement arthroplasty.  Surgery will be performed by Dr. Ollen [name].  His medical doctor is Dr. Doyne Keel.  His cardiologist is Dr. Learta Codding with [facility].  Dr. Doyne Keel and Dr. Margarita Mail office will  be notified of admission and will be consulted if need of any medical  assistance with the patient throughout the hospital course.     Alex   ALP/MEDQ  D:  11/19/2003  T:  11/19/2003  Job:  [id number]   cc:   Doyne Keel, M.D.   [name], M.D.  [facility]  [address]  [address]  [place]  [place] [address]  Fax: [phone]   Learta Codding, M.D. [facility]

## 2010-06-07 NOTE — Consult Note (Signed)
[name], [name]             ACCOUNT NO.:  [id number]   MEDICAL RECORD NO.:  [id number]          PATIENT TYPE:  INP   LOCATION:  0154                         FACILITY:  [facility]   PHYSICIAN:  Arvilla Meres, M.D. LHCDATE OF BIRTH:  October 26, 1940   DATE OF CONSULTATION:  03/06/2004  DATE OF DISCHARGE:                                   CONSULTATION   CARDIOLOGIST:  Dr. Nona Dell and Dr. Lewayne Bunting of [facility]  [place].   PRIMARY CARE PHYSICIAN:  Dr. Forrest Moron.   PATIENT IDENTIFICATION:  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 Andee Lineman and Dr. Diona Browner in our practice. We are asked to  consult today for help with management due to intraoperative hypotension and  bradycardia during right knee replacement.   As above, [name] does have a history of heart failure with nonischemic  cardiomyopathy. He underwent cardiac catheterization in November 2004 which  showed just minimal nonobstructive coronary disease.  Over the past few  months he has done quite well from a heart failure perspective. In November  of last year he underwent hip replacement without significant complications.  Today, he was admitted for right knee replacement due to ongoing severe knee  pain. Initially the plan was to do the surgery under spinal anesthesia,  however this provided inadequate pain control and he was thus switched to  general anesthesia. Upon induction of general anesthesia it appeared that he  had significant hypotension and bradycardia requiring pressor support. The  lowest report of blood pressure I see is a systolic blood pressure of 80  with heart rate of 60. He was resuscitated as well with two liters of IV  fluids.   He is now in the recovery room, doing well. He is off all pressors with a  blood pressure of 130/50 and a heart rate of 80. He has no  complaints except  for right knee pain and he is able to lie flat without any shortness of  breath.   PAST MEDICAL HISTORY:  1.  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      1.  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  2.  Hypertension.  3.  Hyperlipidemia.  4.  Morbid obesity.  5.  Obstructive sleep apnea on CPAP therapy.  6.  Osteoarthritis, status post recent hip replacement and right knee      replacement today.  7.  History of hyperkalemia while on spironolactone and ACE inhibitor.  8.  Chronic renal insufficiency with a baseline creatinine of 1.5.   MEDICATIONS PRIOR TO ADMISSION:  1.  Coreg 12.5 mg b.i.d.  2.  Avodart 0.5 mg daily.  3.  Lipitor 10 mg q.h.s.  4.  Lisinopril 20 mg daily.  5.  Protonix 40 mg daily.  6.  Atarax 25 mg p.r.n.  7.  Methocarbamol 500 mg daily.  8.  Lasix at an unknown dose.  ALLERGIES:  CODEINE.   FAMILY HISTORY:  Positive for coronary artery disease.   SOCIAL HISTORY:  He is single. He lives in [place]. He is on disability. A  history of tobacco for 20 years, but has quit. Also has a history of  alcohol, but has quit as well.   PHYSICAL EXAMINATION:  GENERAL: He is a morbidly obese male, lying flat in  bed, in no distress.  VITAL SIGNS: Blood pressure 133/53 off pressors with a heart rate of 82. He  is saturating 99% on two liters.  HEENT: Sclerae anicteric. EOMI. He does have a swollen upper lip with no  evidence of tongue swelling.  NECK: Large, unable to assess JVP due to his body habitus. Carotids are 2+  without bruits.  LUNGS: Clear to auscultation anteriorly.  CARDIAC: Distant heart sounds. Regular rate and rhythm with no obvious  murmurs, rubs, or gallops.  ABDOMEN: Obese, soft, nontender with mildly hypoactive bowel sounds.  EXTREMITIES: Warm. There is no edema in the left leg. He has a brace on the  right leg. He has good distal pulses.  NEUROLOGIC: He is alert  and oriented times three and otherwise nonfocal.   His labwork preop shows a white count of 7.5, hemoglobin 13.8, hematocrit  41.4, platelet count 192,000. Sodium 142, potassium 4.8, chloride 106,  bicarbonate 28, BUN 41, creatinine 1.9.   Chest x-ray shows cardiomegaly, but no acute airspace disease. EKG shows  normal sinus rhythm at a rate of 88 with occasional PVCs and a left axis  deviation.   ASSESSMENT/PLAN:  [name] is a very pleasant 70 year old man as above  with morbid obesity and a nonischemic cardiomyopathy who developed some  hypotension and bradycardia intraoperatively during his right knee  replacement. He was resuscitated with pressor support as well as two liters  of IV fluids. He is now maintaining his vital signs without any difficulty  off all support. Currently there is no evidence of heart failure or volume  overload.  Agree with ICU monitoring overnight with frequent monitoring of  his CVP pressure. Would back off on his IV fluids just a bit and restart his  heart failure regimen. Would watch his urine output and renal function  closely. Will continue to follow with you.      DB/MEDQ  D:  03/06/2004  T:  03/06/2004  Job:  [id number]   cc:   Learta Codding, M.D. [facility]   Jonelle Sidle, M.D. [facility]

## 2010-06-07 NOTE — Cardiovascular Report (Signed)
   [name], [name]                         ACCOUNT NO.:  [id number]   MEDICAL RECORD NO.:  [id number]                   PATIENT TYPE:  OIB   LOCATION:  4729                                 FACILITY:  [facility]   PHYSICIAN:  Charlton Haws, M.D.                  DATE OF BIRTH:  October 10, 1940   DATE OF PROCEDURE:  DATE OF DISCHARGE:                              CARDIAC CATHETERIZATION   INDICATIONS:  Nonischemic cardiomyopathy with high cardiac output, rule out  shunt.   DESCRIPTION OF PROCEDURE:  The patient is given 5 mg of Versed.  He  tolerated the procedure well despite his large size.  Left ventricular  cavity size was severely dilated.  There was global hypokinesis.  The EF was  in the 20% range.  There was no mural thrombus and no spontaneous contrast.  There was biatrial enlargement.  However, the right ventricle was normal in  size.  The atrial and ventricular septums were intact with no evidence of  ASD or VSD.  Bubble study was negative for right to left shunt.   We had good imaging of the pulmonary outflow track.  There was no evidence  of thrombus.  Pulmonary valve appeared normal.  There was no turbulent flow  in the proximal pulmonary arteries to suggest PDA.   Imaging of the aorta showed no significant debris.   FINAL IMPRESSION:  1. Negative bubble study.  2. No evidence of shunt.  3. Negative VSD, ASD, PDA.  4. Global hypokinesis, ejection fraction 20%.  5. Mild to moderate mitral regurgitation.  6. Normal right ventricular cavity size.  7. No aortic debris.   The patient tolerated the procedure well.   He will be discharged either later today or in the morning.  I suspect he  will have further laboratory work-up as an outpatient including a TSH, T4,  B12, and thiamine level as well as an alkaline phosphatase level in case  this has not been done.  However, some of his high cardiac output may be  secondary to his morbid obesity.   Overall, his long-term  prognosis would appear to be that of a nonischemic  cardiomyopathy.                                               Charlton Haws, M.D.    PN/MEDQ  D:  11/23/2002  T:  11/23/2002  Job:  [id number]   cc:   Doyne Keel, M.D.  [facility]   Learta Codding, M.D.  [address]  [address]  [place]  [place] [address]

## 2010-06-07 NOTE — Op Note (Signed)
NAMEPETERSON, MATHEY             ACCOUNT NO.:  [id number]   MEDICAL RECORD NO.:  [id number]          PATIENT TYPE:  INP   LOCATION:  0002                         FACILITY:  [facility]   PHYSICIAN:  Ollen Gross, M.D.    DATE OF BIRTH:  1940-03-26   DATE OF PROCEDURE:  11/20/2003  DATE OF DISCHARGE:                                 OPERATIVE REPORT   PREOPERATIVE DIAGNOSIS:  Osteoarthritis and dysplasia left hip.   POSTOPERATIVE DIAGNOSIS:  Osteoarthritis and dysplasia left hip.   PROCEDURE:  Left total hip arthroplasty.   SURGEON:  Ollen Gross, M.D.   ASSISTANT:  Alexzandrew L. Julien Girt, P.A.   ANESTHESIA:  Spinal.   ESTIMATED BLOOD LOSS:  500.   DRAIN:  Hemovac x1.   COMPLICATIONS:  None.   CONDITION ON DISCHARGE:  Stable to recovery room.   INDICATIONS FOR PROCEDURE:  Mr. Flippen is a 70 year old male with severe  dysplasia left hip and end-stage arthritis with intractable pain.  He is at  a stage now where he can barely even walk.  He needs to exercise for cardiac  reasons and, give that he cannot walk, he presents now for left total knee  arthroplasty.   DESCRIPTION OF PROCEDURE:  After successful administration of spinal  anesthetic, the patient was placed in the right lateral decubitus position  with left side up and held with the hip positioner.  Left lower extremity  was isolated from his peroneum with plastic drapes and prepped and draped in  usual sterile fashion.  Standard posterolateral incisions made with a #10  blade through subcutaneous tissue to the level of fascia lata which is  incised in line with extended skin incision.  He has a tremendous amount of  hypertrophic capsule.  I palpated and protected the sciatic nerve and  isolated the short external rotators up the femur.  I did a thorough  capsulectomy and he also even had some heterotopic within the capsule.  That  was all excised.  The hip was then dislocated.  The femoral head was  retroverted and  completely flattened out.  Based on his opposite side, the  center of his femoral head was slightly below the tip of the greater  trochanter.  I placed the trial prosthesis so that the center of the trial  head would be about 3 to 4 mm below the tip of the greater trochanter  corresponding with his noneffected side.  We marked the osteotomy line on  the femoral neck and the osteotomy was made with an oscillating saw.  The  femoral head was then removed.  The femur was then retracted anteriorly to  gain acetabular exposure.   We dissected down through the fovea so we could bring his hip center back to  the normal anatomic location.  He had massive osteophytes anterior inferior  which are removed.  We reamed starting at a 47 coursing increments of two to  a 55, then placed a 56 mm pinnacle acetabular shell in anatomic position and  transfixed to two dome screws.  Surprisingly he had excellent coverage even  posterosuperior and we did  not need to put a graft posterosuperior.  The cup  had great stability even without the screws and had excellent additional  purchase with the screws.  We placed a 32 mm neutral +4 liner.  This was a  trial liner.   The femur was then addressed first with a canal finder and then irrigation.  Axial reaming was performed up to 17.5 mm, proximal reaming to a 22D and a  sleeve machine to a small.  A 22B small trial  and sleeve was placed with a  22 x 17 stem and first a 36 standard neck.  His native femur was retroverted  about 15 degrees and we placed him in about 20 to 25 degrees of anteversion  with the stem.  We placed a 32 + 0 head.  He needed additional offset as  there was some soft tissue laxity so we changed to 36+8 neck with a 32+0  head and reduced the hip.  This lead to an excellent soft tissue stability.  He had full extension, full external rotation, 70 degrees flexion and 40  degrees adduction, 90 degrees internal rotation and 90 degrees flexion  and  90 degree internal rotation.  The trials were then removed and the permanent  apex hole eliminator was placed in the acetabular shell.  The permanent 32  mm neutral +4 Marathon liner was placed into the shell.  The 22B small  sleeve was placed with 22 x 17 stem and a 36+8 neck.  Once again, we put him  in about 20 to 25 degrees of anteversion which is about 35 to 40 degrees  beyond his normal version.  The 32+0 head was placed and the hip was reduced  to same stability parameters.  Wounds copiously irrigated with saline  solution and short rotators reattached to the femur through drill holes.  Fascia lata was closed over Hemovac drain with interrupted #1 Vicryl,  subcutaneous closed with #1 and 2-0 Vicryl and subcuticular running 4-0  Monocryl.  Incisions clean and dry and Steri-Strips and a bulky sterile  dressing applied.  He was then awakened and transported to recovery in  stable condition.     Drenda Freeze   FA/MEDQ  D:  11/20/2003  T:  11/20/2003  Job:  [id number]

## 2010-06-07 NOTE — Discharge Summary (Signed)
[name], [name]             ACCOUNT NO.:  [id number]   MEDICAL RECORD NO.:  [id number]          PATIENT TYPE:  INP   LOCATION:  0365                         [facility]AC[facility]   PHYSICIAN:  Ollen Gross, M.D.    DATE OF BIRTH:  31-Jul-1940   DATE OF ADMISSION:  11/20/2003  DATE OF DISCHARGE:  11/24/2003                           DISCHARGE SUMMARY - REFERRING   ADMISSION DIAGNOSES:  1.  Osteoarthritis, left hip.  2.  Nonischemic dilated cardiomyopathy.  3.  Hypertension.  4.  Dyslipidemia.  5.  Morbid obesity.  6.  Sleep apnea.  7.  Bradycardia.  8.  Reflux disease.   DISCHARGE DIAGNOSES:  1.  Osteoarthritis with dysplasia, left hip, status post left total hip      arthroplasty.  2.  Postoperative blood loss anemia.  3.  Status post transfusion without sequelae.  4.  Nonischemic dilated cardiomyopathy.  5.  Hypertension.  6.  Dyslipidemia.  7.  Morbid obesity.  8.  Sleep apnea.  9.  Bradycardia.  10. Reflux disease.  11. Postoperative ventricular ectopy.   PROCEDURE:  The patient was taken to the OR on November 20, 2003 and  underwent a left total hip arthroplasty.   SURGEON:  Ollen Gross, M.D.   ASSISTANT:  Alexzandrew L. Perkins, P.A.-C.   ANESTHESIA:  Spinal.   ESTIMATED BLOOD LOSS:  500 cc.   DRAINS:  Hemovac drain x1.   HISTORY OF PRESENT ILLNESS:  [name] is a 70 year old male with severe  dysplasia of the left hip and end-stage arthritis.  The pain has been  intractable and refractory to nonoperative management.  He now presents for  a total hip.  He can barely walk and needs to exercise for cardiac reasons.   CONSULTATIONS:  [facility] and rehabilitation services.   LABORATORY DATA:  CBC on admission:  Hemoglobin 11.5, hematocrit 34.5, white  cell count 8.6, red cell count 3.4, nondifferential within normal limits  with the exception of monos slightly elevated at 12.  Postoperative H&H 8.9  and 26.8.  He was given blood.   Post-transfusion hemoglobin 9.7.  Last H&H  9.5 and 27.2.  PT and PTT on admission 13.2 and 28, respectively.  INR 1.  _________  x5.  Last PT and INR 19.7 and 2.1.  Chemistry panel on admission:  Elevated BUN of 30, elevated creatinine of 1.9.  Remaining chemistry panel  all within normal limits.  Serum BMETs were followed.  BUN and creatinine  came down from those levels.  Last noted 18 and 1.5, respectively.  He did  have a drop in his sodium from 143 down to 134.  Preoperative UA showed  trace leukocyte esterase, rare epithelial cells, 0-2 white, 0-2 red, few  hyaline cast, small ketones, trace protein.  Otherwise, UA negative.  Blood  type A positive.   X-RAYS:  CT of the chest taken on November 16, 2003 revealed no lung disease.  Left hip films on November 16, 2003 revealed marked degenerative changes in  the left hip with subluxation and _bone on bone change.   Preoperative EKG done on November 16, 2003  revealed normal sinus rhythm with  occasional premature ventricular complexes, nonspecific T wave  abnormalities, no previous tracing confirmed by Dr. Lady Deutscher.  Postoperative EKG on November 20, 2003 revealed sinus rhythm with frequent  premature ventricular complexes, nonspecific T wave abnormalities,  unconfirmed.   HOSPITAL COURSE:  The patient was admitted to [facility] and taken  to the OR where he underwent the above-stated procedure without any  complications.  The patient tolerated the procedure well.  He was later  taken to the recovery room and then to the stepdown unit for postoperative  monitoring due to his sleep apnea and also his cardiac history.  The patient  was seen by Dr. Andee Lineman preoperatively at [facility].  He was consulted to assist in the management of the patient during the  hospital course.  He appeared stable postoperatively.  A rehabilitation  consult was also called.  The patient was seen by rehabilitation services.   He did fairly well through the night following surgery.  Hemoglobin had  dropped down to 8.9 postoperatively.  He was a little tachycardic with a  pulse greater than 100.  It was felt that he would probably drift down even  below 8 by the next day.  Due to his cardiac history and the lower  hemoglobin, it was felt that it would prudent to transfuse 2 units.  He was  given 2 units of blood.  I&O status was watched very closely by cardiology  services.  He was noted to have some ventricular ectopy postoperatively.  His heart status was monitored closely.  He did remain stable, and it was  decided that he would be transferred to a telemetry bed; however, there were  no telemetry beds the first day of surgery, so he remained in the unit until  postoperative day #2 when he was transferred up to [address] [place] for continued  telemetry and postoperative monitoring.  PT was consulted postoperatively to  assist with gait, ambulation, and ADLs.  The patient started getting up out  of bed.  The Hemovac drain placed at the time of surgery was pulled by day  #2.  His dressing was changed, and the incision was healing well.  He was  given a blood transfusion, and his hemoglobin came back up from 8.9 to 9.7.  he had a little bit of positive fluid balance but did not have any signs of  heart failure.  He continued to progress with physical therapy, and it was  felt from a rehabilitation standpoint that he would be a good candidate for  rehabilitation or SACU stay.  He continued to receive excellent care.  He  did have a little bit of fever postoperatively with elevated temperatures,  but this did resolve throughout the hospital course.  By November 24, 2003 he  was seen in rounds.  He had been up ambulating with physical therapy.  He  appeared to be stable from a medical standpoint postoperatively.  It was  decided by cardiology that he was stable from a cardiac standpoint as well, and if there was a bed available,  arrangements were made for a tentative  transfer to rehabilitation or SACU on November 24, 2003.  If not, we will  continue his therapy until appropriate arrangements can be made.   DISPOSITION:  Tentative transfer to rehabilitation/SACU November 24, 2003 bed  availability.   DISCHARGE MEDICATIONS:  1.  Coumadin as per pharmacy protocol, titrate INR between 2.0 and 3.0.  Needs Coumadin for a minimum of three weeks.  2.  Colace 100 mg p.o. b.i.d.  3.  Zocor 200 mg p.o. q.h.s.  4.  Altace 5 mg p.o. daily.  5.  Spironolactone 25 mg p.o. daily.  6.  Coreg 25 mg p.o. b.i.d.  7.  Pepcid 40 mg p.o. q.h.s.  8.  Avodart 0.5 mg p.o. q.h.s.  9.  Protonix 40 mg p.o. b.i.d.  10. Senokot-S p.r.n.  11. Percocet, one or two every four to six hours as needed for pain.  12. Tylenol, one or two every four to six hours as needed for mild pain,      temperature, or headache.  13. Robaxin 500 mg p.o. q.6-8h. p.r.n. spasm.  14. Benadryl 25 to 50 p.o. q.h.s. p.r.n. sleep.  15. He may also use Benadryl cream to leg secondary to itching.   DIET:  Cardiac diet.   ACTIVITY:  1.  He is encouraged to maintain 25 to 50% partial weightbearing to the left      lower extremity.  2.  Gait training, ambulation, and ADLs as per PT and OT while on      rehabilitation/SACU.  3.  May start showering.  4.  Hip precautions at all times.  5.  Knee immobilizer while in bed at night.  May have the knee immobilizer      off when he is up walking and when he is sitting in a chair.  Please keep a normal bed pillow in between his knees while he is up in the  chair or while he is sitting up in the bed.   WOUND CARE:  Daily dressing changes.   FOLLOW UP:  The patient is to follow up with Dr. Lequita Halt in the office two  weeks from surgery or following discharge from the rehabilitation/SACU unit.   CONDITION ON DISCHARGE:  Improved.     Alex   ALP/MEDQ  D:  11/24/2003  T:  11/24/2003  Job:  [id number]   cc:   Ollen Gross, M.D.  [facility]  [address]  [address]  [place]  [place] [address]  Fax: [phone]   Learta Codding, M.D. [facility]   Rehabilitation Services

## 2010-06-07 NOTE — Cardiovascular Report (Signed)
[name], [name]                         ACCOUNT NO.:  [id number]   MEDICAL RECORD NO.:  [id number]                   PATIENT TYPE:  OIB   LOCATION:  2864                                 FACILITY:  [facility]   PHYSICIAN:  Arturo Morton. Riley Kill, M.D.             DATE OF BIRTH:  02-25-1940   DATE OF PROCEDURE:  11/22/2002  DATE OF DISCHARGE:                              CARDIAC CATHETERIZATION   INDICATIONS:  The patient is a 70 year old gentleman who was admitted with  chest pain.  He had an abnormal Cardiolite and reduced overall LV function.  He was brought to the catheterization laboratory today for further  evaluation.   PROCEDURE:  1. Right and left heart catheterization.  2. Selective coronary arteriography.  3. Selective left ventriculography.  4. Aortic root aortography.   DESCRIPTION OF PROCEDURE:  The patient was brought to the catheterization  laboratory and prepped and draped in the usual fashion after informed  consent.  He was prepped and draped in the usual fashion.  Through an  anterior puncture the right femoral artery was entered.  A 7.5-French  thermodilution Swan-Ganz catheter was then advanced and multiple saturations  obtained as well as right heart pressures.  Following this left heart  catheterization was performed using 6-French sheath and a pigtail catheter.  Simultaneous pressures were obtained.  Following this ventriculography was  performed in the RAO projection followed by aortic root aortography.  Coronary arteriography was then completed without complication.  Heparin  1500 units was given during the course of the procedure and the patient was  then taken to the holding area in satisfactory clinical condition.   HEMODYNAMIC DATA:  1. Right atrium 10.  2. Right ventricle 50/12.  3. Pulmonary artery 44/22.  4. Pulmonary capillary wedge 17.  5. Aortic root 119/85, mean 100.  6. Left ventricle 99/19.  7. No mitral valve gradient.  8. No aortic  valve gradient.  9. Fick cardiac output 6.9 L/minute.  10.      Thermodilution cardiac output 10.9 L/minute.  11.      IVC saturation 77%.  12.      SVC saturation 77%.  13.      Mid right atrial saturation 71%.  14.      Pulmonary artery saturation 75%.  15.      Aortic root saturation 94%.   ANGIOGRAPHIC DATA:  1. On ventriculography in the RAO projection there was severe global     hypokinesis as well as markedly dilated LV.  We could not completely fill     the LV.  I could not appreciate significant mitral regurgitation,     although this could not be excluded as well.  2. The aortic root demonstrates no definite evidence of aortic     regurgitation.  3. The left main coronary artery is free of critical disease.  4. The LAD courses to the apex.  There is a  major diagonal branch.  No     obvious lesions are seen in the LAD.  5. The circumflex provides an AV circumflex with a very large trifurcating     marginal branch.  This marginal branch has about 30% mid narrowing, but     no high grade areas of focal stenosis.  6. The right coronary artery is a dominant vessel with about 20% mid     narrowing and about 20-30% narrowing eccentrically just beyond the origin     of the PDA.  The remainder of the vessel is without critical narrowing.   CONCLUSIONS:  1. Severe nonischemic cardiomyopathy.  2. Global hypokinesis of uncertain etiology.  3. No critical coronary artery disease.  4. High cardiac output disproportionate to left ventricular function.   PLAN:  The patient will need a TEE to rule out left to right shunt.  We will  also obtain thyroid functions to rule out high output.  I will discuss the  case with Learta Codding, M.D. with regard to further work-up.                                               Arturo Morton. Riley Kill, M.D.    TDS/MEDQ  D:  11/22/2002  T:  11/22/2002  Job:  [id number]   cc:   Patsey Berthold, M.D.  [address]  [address]  [place]   [place] [address]   CV Lab

## 2010-06-07 NOTE — Discharge Summary (Signed)
[name], [name]             ACCOUNT NO.:  [id number]   MEDICAL RECORD NO.:  [id number]          PATIENT TYPE:  ORB   LOCATION:  4503                         FACILITY:  [facility]   PHYSICIAN:  Ranelle Oyster, M.D.DATE OF BIRTH:  [birth year]   DATE OF ADMISSION:  11/24/2003  DATE OF DISCHARGE:  11/29/2003                                 DISCHARGE SUMMARY   DISCHARGE DIAGNOSIS:  1.  Left total hip replacement.  2.  Gastroesophageal reflux disease.  3.  Hypertension.  4.  Nonischemic cardiomyopathy.  5.  Sleep apnea.  6.  Postop anemia.   HISTORY OF PRESENT ILLNESS:  [name] is a 70 year old male with a history  of cardiomyopathy with OA of left hip who elected to undergo left total hip  replacement October 31 by Dr. Lequita Halt.  Postop, partial weight bearing and  on Coumadin for DVT prophylaxis.  Perioperatively, the patient developed  bigeminy requiring IV lidocaine.  He was evaluated and was followed by  cardiology during his stay at [facility].  He did receive 2 units packed  red blood cells for postop anemia with H&H improving 9.5 and 22.2.  The  patient is to follow up with Dr. Andee Lineman on December 04, 2003, on an  outpatient basis.  Therapy was initiated and the is noted to be at close  supervision for transfers, close supervision with ambulating 120 feet  requiring mod to max assist for lower body care.  [facility] was consulted for low  level therapies.   PAST MEDICAL HISTORY:  See discharge diagnosis plus history of right carpal  tunnel release, left lower extremity limb discrepancy, left carpal tunnel  syndrome. GERD with chest pain, BPH, right shoulder rotator cuff problems,  nocturia, right foot bunionectomy, and a history of bradycardia.   ALLERGIES:  Codeine.   FAMILY HISTORY:  Positive for CVA and cancer.   SOCIAL HISTORY:  The patient lives alone in a one level home with 2-3 steps  to enter and was independent prior to admission.  He does not use any  tobacco or  alcohol.   HOSPITAL COURSE:  [name] was admitted to the [facility] on November 24, 2003, for inpatient therapies to consist of PT and OT daily.  Past  admission the patient was maintained on Coumadin for DVT prophylaxis.  Recheck of CBC shows hemoglobin 8.9, hematocrit 25.7, white count 10.1,  platelets 252.  Check of electrolytes showed sodium 140, potassium 4.3,  chloride 109, CO2 26, BUN 15, creatinine 1.3, glucose 89.  The patient's  left hip incision has been healing well without any signs or symptoms of  infection.  No drainage or erythema noted.  The patient was noted to have  some abnormal LFTs with AST of 45, ELT 70.  Repeat labs showed abnormal LFTs  to have improved with AST 27, ALT 47, total bilirubin 0.7.  Recheck of H&H  was stable with hemoglobin of 8.8 and hematocrit 25.3.  During his stay in  subacute, the patient has progressed along well.  He is currently at  modified independent level for ADLs, modified independent for  transfers,  modified independent for ambulating 100 to 150 feet.  The patient's family  have expressed concerns regarding his ability to stay by himself at home and  a search for nursing home was initiated.  [facility] available at [facility] and the patient is to be discharged to this facility on  November 30, 2003.   DISCHARGE MEDICATIONS:  1.  Coumadin  2.  Altace 5 mg daily.  3.  Aldactone 25 mg daily.  4.  Coreg 25 mg b.i.d.  5.  Protonix 40 mg daily.  6.  Trinsicon 1 p.o. b.i.d.  7.  Senokot-S 2 p.o. q.h.s.  8.  Avodart 0.5 mg q.h.s.  9.  Lipitor 10 mg p.o. q.h.s.  10. Zantac 20 mg p.o. b.i.d.  11. Flonase one spray each nostril b.i.d.  12. Oxycodone IR 5-10 mg p.o. q.4-6h. p.r.n. pain.  13. Robaxin 500 mg p.o. q.i.d. p.r.n. spasms.   ACTIVITY:  Partial weight bearing left lower extremity with the use of a  walker.  Follow left total hip precautions.   DIET:  Regular.   WOUND CARE:  Wash with soap and water, keep  clean and dry.   SPECIAL INSTRUCTIONS:  Progress with PT and OT to continue past discharge.   FOLLOW UP:  The patient is to follow up with Dr. Lequita Halt in the next two  weeks, follow up with Dr. Andee Lineman at cardiology clinic in [place], follow up  with Dr. Riley Kill as needed.       PP/MEDQ  D:  11/29/2003  T:  11/29/2003  Job:  [id number]   cc:   Deanne Coffer, M.D.  [facility]  [address]  [address]  [place]  [place] [address]  Fax: [phone]

## 2010-06-07 NOTE — Discharge Summary (Signed)
NAMETIYON, SANOR                         ACCOUNT NO.:  [id number]   MEDICAL RECORD NO.:  [id number]                   PATIENT TYPE:  INP   LOCATION:  4729                                 FACILITY:  [facility]   PHYSICIAN:  Arturo Morton. Riley Kill, M.D.             DATE OF BIRTH:  11/13/1940   DATE OF ADMISSION:  11/22/2002  DATE OF DISCHARGE:  11/23/2002                           DISCHARGE SUMMARY - REFERRING   DISCHARGE DIAGNOSES:  1. External pain on admission, November 14, 2002.  2. Abnormal Cardiolite study, November 17, 2002 with ejection fraction 20%,     dilated ventricle, fixed anteroseptal and inferior defects.  No frank     ischemia.  Finding of dilated cardiomyopathy.  3. 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  4. Transesophageal echocardiogram study, November 23, 2002 as dictated below.     Negative bubble study.  No evidence of shunt.  Ejection fraction 20% with     global hypokinesis.  Mild to moderate mitral regurgitation.  No evidence     of thrombus or debris.   SECONDARY DIAGNOSES:  1. Hypertension.  2. Dyslipidemia.  3. Obesity.  4. Strong family history of coronary artery disease.  One brother died at     age 25 after undergoing coronary artery bypass graft surgery.  5. Osteoarthritis.  6. Status post appendectomy.  7. Carpal tunnel syndrome.   PROCEDURES:  1. November 22, 2002, left heart catheterization by Dr. Arturo Morton. Stuckey.     Study shows that there is a dilated left ventricle with poor left     ventricular function.  Ejection fraction unable to be determined.  No     aortic insufficiency.  The left anterior descending is free of disease.     The left circumflex has a large obtuse marginal with 30% proximal     stenosis.  The right coronary artery has 20% midpoint stenosis and a 20-     30% stenosis after the takeoff of the PDA.  Finding of nonischemic     cardiomyopathy which is severe and noncritical  coronary artery disease.     The plan is to recommend transesophageal echocardiogram to rule out shunt     and to check the T4.  2. November 23, 2002, transesophageal echocardiogram.  The study was a     negative study.  No ventricular septal defect.  No atrial septal defect.     No patent ductus arteriosus.  Ejection fraction of 20% with global     hypokinesis.  Mild to moderate mitral regurgitation.  Normal right     ventricular function.  No clot or debris was found.  Recommendation for     future study were TSH, T4, B12, thiamine, and fasting lipid profile.     These will all be done as an outpatient.   DISPOSITION:  Mr. Strauch is ready for discharge after undergoing  transesophageal echocardiogram on November 23, 2002 and left heart  catheterization November 22, 2002.  He is afebrile.  His mental status is  clear.  He has no complications from the catheterization site.  It is soft.  There is no bruit auscultated.  The right lower extremity is well perfused.  The patient is not having any chest pain.  He is not particularly short of  breath.  He was achieving 98% oxygen saturation on room air.  His blood  pressure seems well controlled.  He goes home with the following  medications.   DISCHARGE MEDICATIONS:  1. Chlorthalidone 25 mg q.d.  2. Lipitor 10 mg q.d. at bed time.  3. Relafen 750 mg b.i.d.  4. Enteric-coated aspirin 325 mg q.d.  5. K-Dur 20 mEq q.d.  6. Entex PSE b.i.d.  7. This pain management if needed for discomfort in the right groin is     Tylenol 325 mg 1-2 tablets q.4-6h. p.r.n. pain.   DISCHARGE INSTRUCTIONS:  Low sodium, low cholesterol diet.  Mr. Calvin may  shower.  He is to call the [facility] office of [facility] at [phone] if  he experiences swelling or increasing pain at his catheterization site.  He  has follow-up visits at [facility] office on Wednesday,  November 30, 2002 at 9:45 in the morning.  Blood tests will be taken.  He is  not to  eat any breakfast or drink any liquids on Wednesday morning before  his blood test or office visit [facility], Wednesday December 07, 2002 at 1:30 in the afternoon to see Dr. Vernie Shanks. DeGent.   BRIEF HISTORY:  Mr. Altergott is a 70 year old male.  He has a history of  obesity, dyslipidemia, and hypertension.  He was admitted on November 14, 2002 with intermittent episodes of very localized pain in his sternum which  occurred in the late evening of that day and the early morning of November 14, 2002.  He states that there was no radiation of the pain to the  shoulder, neck or arms.  He was admitted and at that time ruled out for  myocardial infarction.  Enzymes were within normal limits.  He was set up  for a Cardiolite study.  This study was performed November 17, 2002.  It  showed an ejection fraction of 20% with markedly dilated ventricle, thinning  of the inferior wall and anteroseptal area.  It was a predominantly fixed  anteroseptal and inferior defect.  Findings consistent with dilated  cardiomyopathy without frank ischemia but the fixed defects were perhaps  basis of underlying coronary artery disease.   Since the patient has multiple risk factors including dyslipidemia,  hypertension, and strong family history of coronary artery disease, the  patient was scheduled for left heart catheterization.  Echocardiographic  study was also done on November 17, 2002 which showed multiple segmental wall  motion abnormalities, akinesis of the anterior wall and inferior wall, and  hypokinesis of the septum and lateral wall.  There was no definite  desynchrony between the septum and the lateral wall.  The patient does not  appear particularly volume overloaded and we will continue his current  diuretic.   HOSPITAL COURSE:  The patient was admitted to [facility]  on November 22, 2002 electively for a left heart catheterization in the setting of an abnormal Cardiolite study  prior to this admission.  He was  found to have nonobstructive coronary artery disease with the results  as  dictated above.  Further workup was recommended in the form of a  transesophageal echocardiogram which was done on November 23, 2002.  This  study showed, as dictated above, no evidence of septal defect or patent  ductus arteriosus.  Study also showed an ejection fraction of 20% with  global hypokinesis, mild to moderate mitral regurgitation, no clot evident.   PLAN:  Blood tests are recommended as follow-up.  Fasting lipid profile,  thyroid-stimulating hormones, B12 level, thiamine level also.  It is  possible that he would start on Coreg 3.125 mg b.i.d. in the setting of  nonischemic cardiomyopathy.  This has not been done this admission and is  only a recommendation for the future.    LABORATORY DATA:  Hemoglobin is 15, hematocrit is 45 on November 22, 2002.  Serum electrolytes on November 22, 2002 are sodium 142, potassium 4.7,  glucose 101.  There is obviously a more full laboratory workup awaits Mr.  Vinzant as an outpatient.      Maple Mirza, P.A.                    Arturo Morton. Riley Kill, M.D.    GM/MEDQ  D:  11/23/2002  T:  11/23/2002  Job:  [id number]   cc:   Wende Crease, M.D.   [facility]  [address]  [place], [place] [address]  [phone]

## 2010-07-04 ENCOUNTER — Encounter: Payer: Self-pay | Admitting: Cardiology

## 2010-08-13 ENCOUNTER — Encounter: Payer: Self-pay | Admitting: Cardiology

## 2010-08-13 ENCOUNTER — Ambulatory Visit (INDEPENDENT_AMBULATORY_CARE_PROVIDER_SITE_OTHER): Payer: Medicaid Other | Admitting: Cardiology

## 2010-08-13 VITALS — BP 109/71 | HR 53 | Resp 18 | Ht 72.0 in | Wt 344.8 lb

## 2010-08-13 DIAGNOSIS — I10 Essential (primary) hypertension: Secondary | ICD-10-CM

## 2010-08-13 DIAGNOSIS — I82629 Acute embolism and thrombosis of deep veins of unspecified upper extremity: Secondary | ICD-10-CM

## 2010-08-13 DIAGNOSIS — I5022 Chronic systolic (congestive) heart failure: Secondary | ICD-10-CM

## 2010-08-13 DIAGNOSIS — G4733 Obstructive sleep apnea (adult) (pediatric): Secondary | ICD-10-CM

## 2010-08-13 DIAGNOSIS — I82621 Acute embolism and thrombosis of deep veins of right upper extremity: Secondary | ICD-10-CM

## 2010-08-13 DIAGNOSIS — I428 Other cardiomyopathies: Secondary | ICD-10-CM

## 2010-08-13 NOTE — Patient Instructions (Signed)
Your physician wants you to follow-up in: 6 months. You will receive a reminder letter in the mail one-two months in advance. If you don't receive a letter, please call our office to schedule the follow-up appointment. Your physician recommends that you continue on your current medications as directed. Please refer to the Current Medication list given to you today. 

## 2010-08-13 NOTE — Assessment & Plan Note (Signed)
Likely related to prior Port-A-Cath. This has been removed and the patient is continued on warfarin and followed by his primary care physician.

## 2010-08-13 NOTE — Assessment & Plan Note (Signed)
Blood pressure well controlled no further adjustments in medications are needed.

## 2010-08-13 NOTE — Progress Notes (Signed)
HPI The patient is a 70 year old male with a history of nonischemic dilated cardiomyopathy, status post catheterization in 2004 but no obstructive coronary artery disease. In 2004 his ejection fraction of 25-30% and an improved on medical therapy to 55% in 2008. The patient walks with a cane but this is related to a prior hip injury. He does have shortness of breath on exertion and his in NYHA class IIB but this is related to his work morbidly obese state and deconditioning. Otherwise the patient does not complain of chest pain. He states that he has gained a lot of weight after being placed on antidepressant therapy with Cymbalta. He is also not compliant with his CPAP mask for obstructive sleep apnea. Reportedly had a Port-A-Cath placed for venous access which was complicated by an upper arm DVT. In the interim the Port-A-Cath has been removed but the patient still taking warfarin which is followed by his primary care physician. He reports no palpitations or significant baseline artifact on his electrocardiogram but he does not appear that he is in atrial fibrillation. The patient reports no presyncope or syncope  Allergies  Allergen Reactions  . Warfarin Sodium     REACTION: possible Coumadin induced necrosis    Current Outpatient Prescriptions on File Prior to Visit  Medication Sig Dispense Refill  . amLODipine (NORVASC) 10 MG tablet Take 10 mg by mouth daily.        . carvedilol (COREG) 25 MG tablet Take 25 mg by mouth 2 (two) times daily with a meal.        . Cetirizine HCl 10 MG CAPS Take by mouth daily.        . colchicine (COLCRYS) 0.6 MG tablet Take 0.6 mg by mouth daily.        . DULoxetine (CYMBALTA) 30 MG capsule Take 30 mg by mouth daily.        Marland Kitchen dutasteride (AVODART) 0.5 MG capsule Take 0.5 mg by mouth daily.        . furosemide (LASIX) 40 MG tablet Take 40 mg by mouth every morning.        Marland Kitchen HYDROcodone-acetaminophen (VICODIN) 5-500 MG per tablet Take 1 tablet by mouth 2 (two)  times daily as needed.        Marland Kitchen lisinopril (PRINIVIL,ZESTRIL) 40 MG tablet Take 40 mg by mouth daily.        . pravastatin (PRAVACHOL) 40 MG tablet Take 40 mg by mouth daily.        Marland Kitchen alum & mag hydroxide-simeth (ANTACID ANTI-GAS) 200-200-20 MG/5ML suspension Take by mouth as needed.        Marland Kitchen aspirin (ASPIR-81) 81 MG EC tablet Take 81 mg by mouth daily.          Past Medical History  Diagnosis Date  . Nonischemic dilated cardiomyopathy   . Mild renal insufficiency   . Hyperkalemia   . Hypertension   . Obstructive sleep apnea   . Dyslipidemia   . Diabetes mellitus     Past Surgical History  Procedure Date  . Appendectomy   . Bunionectomy   . Carnal tunnel surgery     of right hand  . Left hip replacement     No family history on file.  History   Social History  . Marital Status: Single    Spouse Name: N/A    Number of Children: N/A  . Years of Education: N/A   Occupational History  . Not on file.   Social History Main Topics  .  Smoking status: Never Smoker   . Smokeless tobacco: Not on file  . Alcohol Use: Not on file  . Drug Use: Not on file  . Sexually Active: Not on file   Other Topics Concern  . Not on file   Social History Narrative  . No narrative on file   [id number] positives as outlined above. The remainder of the 18  point review of systems is negative  PHYSICAL EXAM BP 109/71  Pulse 53  Resp 18  Ht 6' (1.829 m)  Wt 344 lb 12.8 oz (156.4 kg)  BMI 46.76 kg/m2  SpO2 95%  General: Morbidly obese African American male  Head: Normocephalic and atraumatic Eyes:PERRLA/EOMI intact, conjunctiva and lids normal Ears: No deformity or lesions Mouth:normal dentition, normal posterior pharynx Neck: Supple, no JVD.  No masses, thyromegaly or abnormal cervical nodes Lungs: Normal breath sounds bilaterally without wheezing.  Normal percussion Cardiac: regular rate and rhythm with normal S1 and S2, no S3 or S4.  PMI is normal.  No pathological  murmurs Abdomen: Normal bowel sounds, abdomen is soft and nontender without masses, organomegaly or hernias noted.  No hepatosplenomegaly MSK: Back normal, normal gait muscle strength and tone normal Vascular: Pulse is normal in all 4 extremities Extremities: Nonpitting edema Neurologic: Alert and oriented x 3 Skin: Intact without lesions or rashes Lymphatics: No significant adenopathy Psychologic: Normal affect   ECG: Significant baseline artifact but the patient does appear to be still in normal sinus rhythm. There are PVCs. Her no acute ischemic changes  Limited bedside echocardiogram: Echo difficult study ejection fraction of 50-55% and no obvious wall motion abnormalitiesl . No significant mitral regurgitation. No definite aortic stenosis.   ASSESSMENT AND PLAN

## 2010-08-13 NOTE — Assessment & Plan Note (Signed)
Bedside echocardiogram reveals that the patient has a normal ejection fraction. He is morbidly obese any shortness of breath is related to his deconditioning and obesity. There is no evidence of volume overload. His blood pressures controlled and no further adjustments in medications are needed. Of note is that the patient also had a normal cardiac catheterization.

## 2010-08-13 NOTE — Assessment & Plan Note (Signed)
Noncompliant with CPAP mask

## 2010-10-09 ENCOUNTER — Other Ambulatory Visit: Payer: Self-pay | Admitting: Cardiology

## 2010-12-10 DIAGNOSIS — R079 Chest pain, unspecified: Secondary | ICD-10-CM

## 2010-12-11 ENCOUNTER — Other Ambulatory Visit: Payer: Self-pay

## 2010-12-11 ENCOUNTER — Inpatient Hospital Stay (HOSPITAL_COMMUNITY)
Admission: RE | Admit: 2010-12-11 | Discharge: 2010-12-14 | DRG: 287 | Disposition: A | Discharge status: Home / Self Care | Payer: Medicare Other | Source: Ambulatory Visit | Attending: Cardiology | Admitting: Cardiology

## 2010-12-11 ENCOUNTER — Encounter (HOSPITAL_COMMUNITY): Payer: Self-pay | Admitting: General Practice

## 2010-12-11 DIAGNOSIS — I129 Hypertensive chronic kidney disease with stage 1 through stage 4 chronic kidney disease, or unspecified chronic kidney disease: Secondary | ICD-10-CM | POA: Diagnosis present

## 2010-12-11 DIAGNOSIS — I428 Other cardiomyopathies: Secondary | ICD-10-CM

## 2010-12-11 DIAGNOSIS — I5022 Chronic systolic (congestive) heart failure: Secondary | ICD-10-CM | POA: Diagnosis present

## 2010-12-11 DIAGNOSIS — E119 Type 2 diabetes mellitus without complications: Secondary | ICD-10-CM | POA: Diagnosis present

## 2010-12-11 DIAGNOSIS — I279 Pulmonary heart disease, unspecified: Secondary | ICD-10-CM | POA: Diagnosis present

## 2010-12-11 DIAGNOSIS — R079 Chest pain, unspecified: Principal | ICD-10-CM | POA: Diagnosis present

## 2010-12-11 DIAGNOSIS — G4733 Obstructive sleep apnea (adult) (pediatric): Secondary | ICD-10-CM | POA: Diagnosis present

## 2010-12-11 DIAGNOSIS — N189 Chronic kidney disease, unspecified: Secondary | ICD-10-CM | POA: Diagnosis present

## 2010-12-11 DIAGNOSIS — E875 Hyperkalemia: Secondary | ICD-10-CM | POA: Diagnosis present

## 2010-12-11 DIAGNOSIS — E785 Hyperlipidemia, unspecified: Secondary | ICD-10-CM | POA: Diagnosis present

## 2010-12-11 DIAGNOSIS — I509 Heart failure, unspecified: Secondary | ICD-10-CM | POA: Diagnosis present

## 2010-12-11 HISTORY — DX: Major depressive disorder, single episode, unspecified: F32.9

## 2010-12-11 HISTORY — DX: Unspecified osteoarthritis, unspecified site: M19.90

## 2010-12-11 HISTORY — DX: Depression, unspecified: F32.A

## 2010-12-11 HISTORY — DX: Acute embolism and thrombosis of deep veins of unspecified upper extremity: I82.629

## 2010-12-11 LAB — CBC
HCT: 38.1 % — ABNORMAL LOW (ref 39.0–52.0)
Hemoglobin: 12.4 g/dL — ABNORMAL LOW (ref 13.0–17.0)
MCH: 32.1 pg (ref 26.0–34.0)
RBC: 3.86 MIL/uL — ABNORMAL LOW (ref 4.22–5.81)

## 2010-12-11 MED ORDER — ENOXAPARIN SODIUM 40 MG/0.4ML ~~LOC~~ SOLN
40.0000 mg | SUBCUTANEOUS | Status: DC
  Administered 2010-12-11 – 2010-12-12 (×2): 40 mg via SUBCUTANEOUS
  Filled 2010-12-11 (×3): qty 0.4

## 2010-12-11 MED ORDER — CARVEDILOL 25 MG PO TABS
25.0000 mg | ORAL_TABLET | Freq: Two times a day (BID) | ORAL | Status: DC
  Administered 2010-12-11 – 2010-12-14 (×6): 25 mg via ORAL
  Filled 2010-12-11 (×9): qty 1

## 2010-12-11 MED ORDER — AMLODIPINE BESYLATE 10 MG PO TABS
10.0000 mg | ORAL_TABLET | Freq: Every day | ORAL | Status: DC
  Administered 2010-12-12 – 2010-12-14 (×3): 10 mg via ORAL
  Filled 2010-12-11 (×3): qty 1

## 2010-12-11 MED ORDER — ASPIRIN 81 MG PO CHEW
324.0000 mg | CHEWABLE_TABLET | ORAL | Status: AC
  Administered 2010-12-12: 324 mg via ORAL
  Filled 2010-12-11: qty 3
  Filled 2010-12-11: qty 4

## 2010-12-11 MED ORDER — ZOLPIDEM TARTRATE 5 MG PO TABS: 5.0000 mg | ORAL_TABLET | Freq: Every evening | ORAL | Status: DC | PRN

## 2010-12-11 MED ORDER — COLCHICINE 0.6 MG PO TABS
0.6000 mg | ORAL_TABLET | Freq: Every day | ORAL | Status: DC | PRN
  Filled 2010-12-11: qty 1

## 2010-12-11 MED ORDER — HYDROCODONE-ACETAMINOPHEN 5-325 MG PO TABS
1.0000 | ORAL_TABLET | Freq: Four times a day (QID) | ORAL | Status: DC | PRN
  Administered 2010-12-12 – 2010-12-14 (×3): 2 via ORAL
  Filled 2010-12-11 (×3): qty 2

## 2010-12-11 MED ORDER — ALUM & MAG HYDROXIDE-SIMETH 200-200-20 MG/5ML PO SUSP: 5.0000 mL | ORAL | Status: DC | PRN

## 2010-12-11 MED ORDER — SODIUM CHLORIDE 0.9 % IJ SOLN: 3.0000 mL | INTRAMUSCULAR | Status: DC | PRN

## 2010-12-11 MED ORDER — DIAZEPAM 5 MG PO TABS: 5.0000 mg | ORAL_TABLET | ORAL | Status: AC

## 2010-12-11 MED ORDER — FUROSEMIDE 40 MG PO TABS
40.0000 mg | ORAL_TABLET | ORAL | Status: DC
  Administered 2010-12-12 – 2010-12-14 (×3): 40 mg via ORAL
  Filled 2010-12-11 (×5): qty 1

## 2010-12-11 MED ORDER — NITROGLYCERIN 0.4 MG SL SUBL: 0.4000 mg | SUBLINGUAL_TABLET | SUBLINGUAL | Status: DC | PRN

## 2010-12-11 MED ORDER — ACETAMINOPHEN 325 MG PO TABS: 650.0000 mg | ORAL_TABLET | ORAL | Status: DC | PRN

## 2010-12-11 MED ORDER — SIMVASTATIN 20 MG PO TABS
20.0000 mg | ORAL_TABLET | Freq: Every day | ORAL | Status: DC
  Administered 2010-12-12 – 2010-12-13 (×2): 20 mg via ORAL
  Filled 2010-12-11 (×4): qty 1

## 2010-12-11 MED ORDER — INSULIN ASPART 100 UNIT/ML ~~LOC~~ SOLN
0.0000 [IU] | Freq: Three times a day (TID) | SUBCUTANEOUS | Status: DC
  Administered 2010-12-12 (×2): 2 [IU] via SUBCUTANEOUS
  Administered 2010-12-12: 3 [IU] via SUBCUTANEOUS
  Filled 2010-12-11 (×2): qty 3

## 2010-12-11 MED ORDER — SODIUM CHLORIDE 0.9 % IV SOLN: 250.0000 mL | INTRAVENOUS | Status: DC

## 2010-12-11 MED ORDER — INSULIN ASPART 100 UNIT/ML ~~LOC~~ SOLN
0.0000 [IU] | Freq: Every day | SUBCUTANEOUS | Status: DC
  Administered 2010-12-11: 2 [IU] via SUBCUTANEOUS

## 2010-12-11 MED ORDER — LISINOPRIL 40 MG PO TABS
40.0000 mg | ORAL_TABLET | Freq: Every day | ORAL | Status: DC
  Administered 2010-12-12 – 2010-12-14 (×3): 40 mg via ORAL
  Filled 2010-12-11 (×4): qty 1

## 2010-12-11 MED ORDER — ONDANSETRON HCL 4 MG/2ML IJ SOLN: 4.0000 mg | Freq: Four times a day (QID) | INTRAMUSCULAR | Status: DC | PRN

## 2010-12-11 MED ORDER — SODIUM CHLORIDE 0.9 % IV SOLN
1.0000 mL/kg/h | INTRAVENOUS | Status: DC
  Administered 2010-12-13: 1 mL/kg/h via INTRAVENOUS

## 2010-12-11 MED ORDER — ALPRAZOLAM 0.25 MG PO TABS: 0.2500 mg | ORAL_TABLET | Freq: Two times a day (BID) | ORAL | Status: DC | PRN

## 2010-12-11 MED ORDER — DUTASTERIDE 0.5 MG PO CAPS
0.5000 mg | ORAL_CAPSULE | Freq: Every day | ORAL | Status: DC
  Administered 2010-12-12 – 2010-12-14 (×3): 0.5 mg via ORAL
  Filled 2010-12-11 (×4): qty 1

## 2010-12-11 MED ORDER — ASPIRIN 81 MG PO TBEC
81.0000 mg | DELAYED_RELEASE_TABLET | Freq: Every day | ORAL | Status: DC
  Administered 2010-12-12 – 2010-12-14 (×2): 81 mg via ORAL
  Filled 2010-12-11 (×4): qty 1

## 2010-12-11 MED ORDER — LORATADINE 10 MG PO TABS
10.0000 mg | ORAL_TABLET | Freq: Every day | ORAL | Status: DC
  Administered 2010-12-12 – 2010-12-14 (×3): 10 mg via ORAL
  Filled 2010-12-11 (×4): qty 1

## 2010-12-11 MED ORDER — DULOXETINE HCL 30 MG PO CPEP
30.0000 mg | ORAL_CAPSULE | Freq: Every day | ORAL | Status: DC
  Administered 2010-12-11 – 2010-12-14 (×4): 30 mg via ORAL
  Filled 2010-12-11 (×4): qty 1

## 2010-12-11 MED ORDER — SODIUM CHLORIDE 0.9 % IJ SOLN
3.0000 mL | Freq: Two times a day (BID) | INTRAMUSCULAR | Status: DC
  Administered 2010-12-11 – 2010-12-12 (×3): 3 mL via INTRAVENOUS

## 2010-12-11 MED ORDER — SODIUM CHLORIDE 0.9 % IJ SOLN: 3.0000 mL | Freq: Two times a day (BID) | INTRAMUSCULAR | Status: DC

## 2010-12-12 ENCOUNTER — Other Ambulatory Visit: Payer: Self-pay

## 2010-12-12 DIAGNOSIS — R079 Chest pain, unspecified: Secondary | ICD-10-CM

## 2010-12-12 DIAGNOSIS — I517 Cardiomegaly: Secondary | ICD-10-CM

## 2010-12-12 LAB — LIPID PANEL
LDL Cholesterol: 87 mg/dL (ref 0–99)
Total CHOL/HDL Ratio: 4.5 RATIO
VLDL: 23 mg/dL (ref 0–40)

## 2010-12-12 LAB — GLUCOSE, CAPILLARY
Glucose-Capillary: 146 mg/dL — ABNORMAL HIGH (ref 70–99)
Glucose-Capillary: 190 mg/dL — ABNORMAL HIGH (ref 70–99)

## 2010-12-12 LAB — HEMOGLOBIN A1C
Hgb A1c MFr Bld: 6.9 % — ABNORMAL HIGH (ref <5.7)
Mean Plasma Glucose: 151 mg/dL — ABNORMAL HIGH (ref <117)

## 2010-12-12 LAB — BASIC METABOLIC PANEL
BUN: 23 mg/dL (ref 6–23)
CO2: 25 mEq/L (ref 19–32)
Chloride: 105 mEq/L (ref 96–112)
GFR calc Af Amer: 72 mL/min — ABNORMAL LOW (ref >90)
Potassium: 4.9 mEq/L (ref 3.5–5.1)

## 2010-12-12 LAB — TSH: TSH: 1.092 u[IU]/mL (ref 0.350–4.500)

## 2010-12-12 MED ORDER — ASPIRIN 81 MG PO CHEW
324.0000 mg | CHEWABLE_TABLET | ORAL | Status: AC
  Administered 2010-12-13: 324 mg via ORAL
  Filled 2010-12-12: qty 4

## 2010-12-12 MED ORDER — DIAZEPAM 5 MG PO TABS
5.0000 mg | ORAL_TABLET | ORAL | Status: AC
  Administered 2010-12-13: 5 mg via ORAL
  Filled 2010-12-12: qty 1

## 2010-12-12 NOTE — Progress Notes (Signed)
Chaplain [facility]er Oneida Arenas- Came and spoke to patient. 1:40 PM 12/12/2010

## 2010-12-12 NOTE — Progress Notes (Signed)
  Echocardiogram 2D Echocardiogram has been performed.  Zariana Strub, Real Cons 12/12/2010, 10:40 AM

## 2010-12-12 NOTE — Progress Notes (Signed)
Subjective:  Patient was admitted to [facility] on 12/09/10 with chest pain and had a two day Myoview which finished last night.  We do not have that report but patient reports something showed up which led to transfer last night and plans for cath Friday am.  Patient has had no further chest pain and feels well today.  Objective:  Vital Signs in the last 24 hours: Temp:  [97.5 F (36.4 C)-97.7 F (36.5 C)] 97.7 F (36.5 C) (11/22 0500) Pulse Rate:  [70-75] 70  (11/22 0500) Resp:  [15-21] 21  (11/22 0500) BP: (131-149)/(64-82) 131/64 mmHg (11/22 0500) SpO2:  [93 %-95 %] 93 % (11/22 0500) Weight:  [339 lb (153.769 kg)-340 lb 13.3 oz (154.6 kg)] 340 lb 13.3 oz (154.6 kg) (11/22 0500)  Intake/Output from previous day: 11/21 0701 - 11/22 0700 In: 240 [P.O.:240] Out: -  Intake/Output from this shift:       . amLODipine  10 mg Oral Daily  . aspirin  324 mg Oral Pre-Cath  . aspirin  81 mg Oral Daily  . carvedilol  25 mg Oral BID WC  . diazepam  5 mg Oral On Call  . DULoxetine  30 mg Oral Daily  . dutasteride  0.5 mg Oral Daily  . enoxaparin  40 mg Subcutaneous Q24H  . furosemide  40 mg Oral QAM  . insulin aspart  0-15 Units Subcutaneous TID WC  . insulin aspart  0-5 Units Subcutaneous QHS  . lisinopril  40 mg Oral Daily  . loratadine  10 mg Oral Daily  . simvastatin  20 mg Oral q1800  . sodium chloride  3 mL Intravenous Q12H  . sodium chloride  3 mL Intravenous Q12H      . sodium chloride    . sodium chloride    . sodium chloride      Physical Exam: The patient appears to be in no distress.Morbidly obese wgt 339 lb. Head and neck exam reveals that the pupils are equal and reactive.  The extraocular movements are full.  There is no scleral icterus.  Mouth and pharynx are benign.  No lymphadenopathy.  No carotid bruits.  The jugular venous pressure is normal.  Thyroid is not enlarged or tender.  Chest is clear to percussion and auscultation.  No rales or rhonchi.  Expansion  of the chest is symmetrical.  Heart reveals no abnormal lift or heave.  First and second heart sounds are normal.  There is no murmur gallop rub or click.  The abdomen is soft and nontender.  Bowel sounds are normoactive.  There is no hepatosplenomegaly or mass.  There are no abdominal bruits.  Extremities reveal no phlebitis or edema.  Pedal pulses are good.  There is no cyanosis or clubbing.  Neurologic exam is normal strength and no lateralizing weakness.  No sensory deficits.  Integument reveals no rash  Lab Results:  [facility] 12/11/10 1926  WBC 7.4  HGB 12.4*  PLT 164    Basename 12/12/10 0630  NA 139  K 4.9  CL 105  CO2 25  GLUCOSE 129*  BUN 23  CREATININE 1.16   No results found for this basename: TROPONINI:2,CK,MB:2 in the last 72 hours Hepatic Function Panel No results found for this basename: PROT,ALBUMIN,AST,ALT,ALKPHOS,BILITOT,BILIDIR,IBILI in the last 72 hours  Basename 12/12/10 0630  CHOL 141   No results found for this basename: PROTIME in the last 72 hours  Imaging: Imaging results have been reviewed.  Xray from [facility] shows borderline cardiomegaly and  mild pulmonary congestion.  Cardiac Studies: Myoview done at [facility] yesterday.  Assessment/Plan:  Cardiac cath/poss PCI Friday by Dr. Riley Kill Patient Active Problem List  Diagnoses  . DYSLIPIDEMIA  . GOUT, UNSPECIFIED  . OBSTRUCTIVE SLEEP APNEA  . ESSENTIAL HYPERTENSION, BENIGN  . CARDIOMYOPATHY, DILATED  . CHRONIC SYSTOLIC HEART FAILURE  . ALLERGIC RHINITIS  . RENAL DISEASE, CHRONIC, MILD  . Deep venous thrombosis of right upper extremity    LOS: 1 day    [name] 12/12/2010, 9:39 AM

## 2010-12-13 ENCOUNTER — Encounter (HOSPITAL_COMMUNITY)
Admission: RE | Disposition: A | Discharge status: Home / Self Care | Payer: Self-pay | Source: Ambulatory Visit | Attending: Cardiology

## 2010-12-13 ENCOUNTER — Ambulatory Visit (HOSPITAL_COMMUNITY): Admit: 2010-12-13 | Payer: Self-pay | Admitting: Cardiology

## 2010-12-13 ENCOUNTER — Other Ambulatory Visit: Payer: Self-pay

## 2010-12-13 HISTORY — PX: RIGHT HEART CATHETERIZATION: SHX5447

## 2010-12-13 HISTORY — PX: LEFT HEART CATHETERIZATION WITH CORONARY ANGIOGRAM: SHX5451

## 2010-12-13 LAB — GLUCOSE, CAPILLARY
Glucose-Capillary: 116 mg/dL — ABNORMAL HIGH (ref 70–99)
Glucose-Capillary: 114 mg/dL — ABNORMAL HIGH (ref 70–99)
Glucose-Capillary: 108 mg/dL — ABNORMAL HIGH (ref 70–99)
Glucose-Capillary: 111 mg/dL — ABNORMAL HIGH (ref 70–99)

## 2010-12-13 LAB — CBC
HCT: 38.4 % — ABNORMAL LOW (ref 39.0–52.0)
Hemoglobin: 12.1 g/dL — ABNORMAL LOW (ref 13.0–17.0)
MCHC: 31.5 g/dL (ref 30.0–36.0)
RBC: 3.9 MIL/uL — ABNORMAL LOW (ref 4.22–5.81)

## 2010-12-13 LAB — POCT I-STAT 3, VENOUS BLOOD GAS (G3P V)
Acid-Base Excess: 1 mmol/L (ref 0.0–2.0)
Acid-base deficit: 1 mmol/L (ref 0.0–2.0)
Bicarbonate: 28.3 mEq/L — ABNORMAL HIGH (ref 20.0–24.0)
O2 Saturation: 61 %
TCO2: 28 mmol/L (ref 0–100)
TCO2: 30 mmol/L (ref 0–100)
pCO2, Ven: 53.5 mmHg — ABNORMAL HIGH (ref 45.0–50.0)
pO2, Ven: 35 mmHg (ref 30.0–45.0)

## 2010-12-13 LAB — BASIC METABOLIC PANEL
BUN: 23 mg/dL (ref 6–23)
Chloride: 106 mEq/L (ref 96–112)
GFR calc Af Amer: 70 mL/min — ABNORMAL LOW (ref >90)
GFR calc non Af Amer: 61 mL/min — ABNORMAL LOW (ref >90)
Potassium: 5.2 mEq/L — ABNORMAL HIGH (ref 3.5–5.1)
Sodium: 140 mEq/L (ref 135–145)

## 2010-12-13 LAB — POCT I-STAT 3, ART BLOOD GAS (G3+)
Acid-Base Excess: 1 mmol/L (ref 0.0–2.0)
O2 Saturation: 92 %

## 2010-12-13 LAB — PROTIME-INR: Prothrombin Time: 13.7 seconds (ref 11.6–15.2)

## 2010-12-13 SURGERY — LEFT HEART CATHETERIZATION WITH CORONARY ANGIOGRAM
Anesthesia: LOCAL | Laterality: Right

## 2010-12-13 MED ORDER — SODIUM CHLORIDE 0.9 % IV SOLN
1.0000 mL/kg/h | INTRAVENOUS | Status: AC
  Administered 2010-12-13: 1 mL/kg/h via INTRAVENOUS

## 2010-12-13 MED ORDER — ACETAMINOPHEN 325 MG PO TABS: 650.0000 mg | ORAL_TABLET | ORAL | Status: DC | PRN

## 2010-12-13 MED ORDER — LIDOCAINE HCL (PF) 1 % IJ SOLN
INTRAMUSCULAR | Status: AC
  Filled 2010-12-13: qty 30

## 2010-12-13 MED ORDER — HEPARIN (PORCINE) IN NACL 2-0.9 UNIT/ML-% IJ SOLN
INTRAMUSCULAR | Status: AC
  Filled 2010-12-13: qty 2000

## 2010-12-13 MED ORDER — NITROGLYCERIN 0.2 MG/ML ON CALL CATH LAB
INTRAVENOUS | Status: AC
  Filled 2010-12-13: qty 1

## 2010-12-13 MED ORDER — MIDAZOLAM HCL 2 MG/2ML IJ SOLN
INTRAMUSCULAR | Status: AC
  Filled 2010-12-13: qty 2

## 2010-12-13 MED ORDER — FENTANYL CITRATE 0.05 MG/ML IJ SOLN
INTRAMUSCULAR | Status: AC
  Filled 2010-12-13: qty 2

## 2010-12-13 MED ORDER — ONDANSETRON HCL 4 MG/2ML IJ SOLN: 4.0000 mg | Freq: Four times a day (QID) | INTRAMUSCULAR | Status: DC | PRN

## 2010-12-13 NOTE — Op Note (Signed)
Cardiac Catheterization Procedure Note  Name: [name] MRN: [id number] DOB: May 14, 1940  Procedure: Right Heart Cath, Left Heart Cath, Selective Coronary Angiography, LV angiography  Indication: Chest pain  Procedural Details: The right groin was prepped, draped, and anesthetized with 1% lidocaine. Using the modified Seldinger technique a 5 French sheath was placed in the right femoral artery and a 7 French sheath was placed in the right femoral vein. A Swan-Ganz catheter was used for the right heart catheterization. Standard protocol was followed for recording of right heart pressures and sampling of oxygen saturations. Fick cardiac output was calculated. Standard Judkins catheters were used for selective coronary angiography and left ventriculography. There were no immediate procedural complications. The patient was transferred to the post catheterization recovery area for further monitoring.  Procedural Findings: Hemodynamics RA 18 RV 60/18 PA 57/28(36) PCWP 24 LV 127/24 AO 117/71(90)  Oxygen saturations: PA 64% AO 91% SVC 61%  Cardiac Output (Fick) 7.22 l/m  Cardiac Index (Fick) 2.69 l/min/M2   Coronary angiography: Coronary dominance: right  Left mainstem: No significant obstruction  Left anterior descending (LAD): 20-30% after first diagonal, mild luminal irregularity  Left circumflex (LCx): Consists of large trifurcation OM, with mild luminal changes, no significant obstruction  Right coronary artery (RCA): Large caliber, no obstruction  Left ventriculography: Left ventricular systolic function is normal, LVEF is estimated at 55-65%, there is no significant mitral regurgitation   Final Conclusions:  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  No significant coronary obstruction Recommendations: Use CPAP, weight loss, will check d dimer.   Shawnie Pons 12/13/2010, 12:25 PM

## 2010-12-13 NOTE — Progress Notes (Signed)
Patient ID: [name], male   DOB: 12/09/1940, 70 y.o.   MRN: [id number] Patient is stable post cath.  Groin looks good.  I have explained to him the need to consider using CPAP, and also the need to lose weight.  WIll plan to discharge to follow up with Dr. Andee Lineman.    Shawnie Pons 12/13/2010 6:34 PM

## 2010-12-13 NOTE — Progress Notes (Signed)
Subjective:  He is doing fine.  He has OSA, but does not use his CPAP.  Just does not like it.    Objective:  Vital Signs in the last 24 hours: Temp:  [97.5 F (36.4 C)-97.7 F (36.5 C)] 97.7 F (36.5 C) (11/23 0627) Pulse Rate:  [65-74] 74  (11/23 0627) Resp:  [20-21] 20  (11/23 0627) BP: (112-137)/(72-78) 112/78 mmHg (11/23 0627) SpO2:  [95 %-96 %] 96 % (11/23 0627) Weight:  [155.6 kg (343 lb 0.6 oz)-156.9 kg (345 lb 14.4 oz)] 343 lb 0.6 oz (155.6 kg) (11/23 0500)  Intake/Output from previous day: 11/22 0701 - 11/23 0700 In: 723 [P.O.:720; I.V.:3] Out: 1000 [Urine:1000]   Physical Exam: General: Well developed, obese, in no acute distress. Head:  Normocephalic and atraumatic.  Neck is thick Lungs: Clear to auscultation and percussion. Heart: Normal S1 and S2.  No murmur, rubs or gallops.  Pulses: Pulses normal in all 4 extremities. Extremities: No clubbing or cyanosis. No edema. Neurologic: Alert and oriented x 3.    Lab Results:  Basename 12/13/10 0600 12/11/10 1926  WBC 6.7 7.4  HGB 12.1* 12.4*  PLT 154 164    Basename 12/13/10 0600 12/12/10 0630  NA 140 139  K 5.2* 4.9  CL 106 105  CO2 27 25  GLUCOSE 129* 129*  BUN 23 23  CREATININE 1.19 1.16   No results found for this basename: TROPONINI:2,CK,MB:2 in the last 72 hours Hepatic Function Panel No results found for this basename: PROT,ALBUMIN,AST,ALT,ALKPHOS,BILITOT,BILIDIR,IBILI in the last 72 hours  Basename 12/12/10 0630  CHOL 141   No results found for this basename: PROTIME in the last 72 hours  Imaging: No results found.  EKG:  NSR.  Delay in R wave progression, with left axis.  Cannot exclude old anterior MI  Cardiac Studies:  Done at [facility] hospital  Assessment/Plan:  Chest pain  ---- patient transferred for cath.  I performed study in him in 2004.  Will plan right heart, and cors only.  He understands and consents to the risks of the procedure.   Hold ACE today, ,minimize contrast, and  recheck K in am.   Will do ISTAT in lab.         Shawnie Pons, MD, [facility], FSCAI 12/13/2010, 10:28 AM

## 2010-12-14 NOTE — Discharge Summary (Signed)
Physician Discharge Summary  Patient ID: [name],  MRN: [id number], DOB/AGE: 1940-04-09 70 y.o.  Admit date: 12/11/2010 Discharge date: 12/14/2010  Primary Discharge Diagnosis:  1. 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 NICM with most recent EF 40% by echocardiogram this admission but 55-65% by cath 12/13/10 3. Obesity - counseled. 4. OSA, noncompliant with CPAP, with pulmonary hypertension as a likely result - counseled. 5. Hyperkalemia (with history of such) - see below.  Secondary Discharge Diagnosis:  1. DM 2. HTN 3. HL 4. CKD 5. History of DVT induced by Port-a-Cath RUE, received Coumadin for 6 months 6. Appendectomy 7. Carpal tunnel surgery 8. 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 He presented to [facility] with complaints of SSCP, described as sharp lasting for a few seconds at a time. It has happened and rest and was not made worse by physical activities. There was no radiation. He has chronic DOE. The patient ruled out for MI and EKG was without acute changes. His exertional dyspnea was felt most likely multifactorial. His chest pain was initally felt atypical in nature. He was referred for a Myoview, which demonstrated anteroseptal/inf lateral ischemia with inferior scar. He was subsequently transferred to [facility] for cardiac catheterization. Prior to this on 11/22, 2D echo was obtained showing EF of 40%. He underwent cath on 12/13/10 and this demonstrated no significant coronary obstruction (only 20-30% LAD after the first diagonal), mild luminal LAD irregularity, and mild luminal changes in the LCx. LV systolic function by cath was estimated at 55-65%. Dr. Riley Kill felt he had pulmonary hypertension secondary to untreated sleep apnea and  recommended CPAP compliance and weight loss. The patient was observed overnight and did well. Dr. Patty Sermons has seen and examined him and feels he is stable for discharge. Dr. Riley Kill had previously recommended D-dimer but this was not drawn. I discussed this with Dr. Patty Sermons who felt he was low risk for PE given lack of hypoxia/tachycardia/tachypnea. I also discussed his K of 5.2 with Dr. Patty Sermons who recommends low potassium diet, continue same meds, and follow-up BMET next week.   Discharge Vitals: Blood pressure 112/73, pulse 69, temperature 97.2 F (36.2 C), temperature source Oral, resp. rate 20, height 6' (1.829 m), weight 341 lb 14.9 oz (155.1 kg), SpO2 96.00%.  Labs: Lab Results  Component Value Date   WBC 6.7 12/13/2010   HGB 12.1* 12/13/2010   HCT 38.4* 12/13/2010   MCV 98.5 12/13/2010   PLT 154 12/13/2010    Lab 12/13/10 0600  NA 140  K 5.2*  CL 106  CO2 27  BUN 23  CREATININE 1.19  CALCIUM 8.9  PROT --  BILITOT --  ALKPHOS --  ALT --  AST --  GLUCOSE 129*   No results found for this basename: CKTOTAL:4,CKMB:4,TROPONINI:4 in the last 72 hours Lab Results  Component Value Date   CHOL 141 12/12/2010   HDL 31* 12/12/2010   LDLCALC 87 12/12/2010   TRIG 114 12/12/2010   Diagnostic Studies/Procedures:  1. Cardiac catheterization this admission, please see full report and above for summary.  Discharge Medications:  Current Discharge Medication List    CONTINUE these medications which have NOT CHANGED   Details  amLODipine (NORVASC) 10 MG tablet Take 10 mg by mouth daily.      aspirin (ASPIR-81)  81 MG EC tablet Take 81 mg by mouth daily.      carvedilol (COREG) 25 MG tablet Take 25 mg by mouth 2 (two) times daily with a meal.     Cetirizine HCl 10 MG CAPS Take 10 mg by mouth daily.     colchicine (COLCRYS) 0.6 MG tablet Take 0.6 mg by mouth daily as needed. For gout pain    DULoxetine (CYMBALTA) 30 MG capsule Take 30 mg by mouth daily.      dutasteride  (AVODART) 0.5 MG capsule Take 0.5 mg by mouth daily.     furosemide (LASIX) 40 MG tablet Take 40 mg by mouth every morning.      HYDROcodone-acetaminophen (VICODIN) 5-500 MG per tablet Take 1 tablet by mouth 2 (two) times daily as needed. For pain    lisinopril (PRINIVIL,ZESTRIL) 40 MG tablet Take 40 mg by mouth daily.      pravastatin (PRAVACHOL) 40 MG tablet Take 40 mg by mouth daily.        STOP taking these medications     alum & mag hydroxide-simeth (ANTACID ANTI-GAS) 200-200-20 MG/5ML suspension      amoxicillin (AMOXIL) 500 MG capsule         Disposition:  The patient will be discharged in stable condition to home. Discharge Orders    Future Orders Please Complete By Expires   Diet - low sodium heart healthy      Comments:   Low potassium diet.   Increase activity slowly      Comments:   No driving for 2 days. No lifting over 5 lbs for 1 week. No sexual activity for 1 week.   Discharge wound care:      Comments:   Keep procedure site clean & dry. If you notice increased pain, swelling, bleeding or pus, call/return!  You may shower, but no soaking baths/hot tubs/pools for 1 week.       Follow-up Information    Follow up with [facility]. (Please follow up to discuss your sleep apnea, diabetes, and to work on monitoring your weight loss)    Contact information:   [address]  [place] [address] [phone]       Follow up with Peyton Bottoms, MD. (Our office will call you for an appointment, and also to set up labwork next week (your potassium level was borderline elevated))    Contact information:   [address] [place] [place] [address] [phone]          Duration of Discharge Encounter: Greater than 30 minutes including physician and PA time.  Signed, Ronie Spies PA-C 12/14/2010, 10:32 AM

## 2010-12-14 NOTE — Progress Notes (Signed)
Subjective:  The patient is doing well post-cath.  Did not require intervention.  No chest pain or dyspnea this am.  Right groin okay  Objective:  Vital Signs in the last 24 hours: Temp:  [97.2 F (36.2 C)-97.7 F (36.5 C)] 97.2 F (36.2 C) (11/24 0500) Pulse Rate:  [69-71] 69  (11/24 0500) Resp:  [20-22] 20  (11/24 0500) BP: (110-122)/(67-73) 112/73 mmHg (11/24 0500) SpO2:  [95 %-98 %] 96 % (11/24 0500) Weight:  [341 lb 14.9 oz (155.1 kg)] 341 lb 14.9 oz (155.1 kg) (11/24 0500)  Intake/Output from previous day: 11/23 0701 - 11/24 0700 In: 0  Out: 250 [Urine:250] Intake/Output from this shift:       . amLODipine  10 mg Oral Daily  . aspirin  81 mg Oral Daily  . carvedilol  25 mg Oral BID WC  . diazepam  5 mg Oral Pre-Cath  . DULoxetine  30 mg Oral Daily  . dutasteride  0.5 mg Oral Daily  . fentaNYL      . furosemide  40 mg Oral QAM  . heparin      . insulin aspart  0-15 Units Subcutaneous TID WC  . insulin aspart  0-5 Units Subcutaneous QHS  . lidocaine      . lidocaine      . lisinopril  40 mg Oral Daily  . loratadine  10 mg Oral Daily  . midazolam      . nitroGLYCERIN      . simvastatin  20 mg Oral q1800  . DISCONTD: enoxaparin  40 mg Subcutaneous Q24H  . DISCONTD: sodium chloride  3 mL Intravenous Q12H  . DISCONTD: sodium chloride  3 mL Intravenous Q12H      . sodium chloride 1 mL/kg/hr (12/13/10 1230)  . DISCONTD: sodium chloride    . DISCONTD: sodium chloride 1 mL/kg/hr (12/13/10 0427)  . DISCONTD: sodium chloride      Physical Exam: The patient appears to be in no distress.  Head and neck exam reveals that the pupils are equal and reactive.  The extraocular movements are full.  There is no scleral icterus.  Mouth and pharynx are benign.  No lymphadenopathy.  No carotid bruits.  The jugular venous pressure is normal.  Thyroid is not enlarged or tender.  Chest is clear to percussion and auscultation.  No rales or rhonchi.  Expansion of the chest is  symmetrical.  Heart reveals no abnormal lift or heave.  First and second heart sounds are normal.  There is no murmur gallop rub or click.  The abdomen is soft and nontender.  Bowel sounds are normoactive.  There is no hepatosplenomegaly or mass.  There are no abdominal bruits.  Extremities reveal no phlebitis or edema.  Pedal pulses are good.  There is no cyanosis or clubbing.  Neurologic exam is normal strength and no lateralizing weakness.  No sensory deficits.  Integument reveals no rash  Lab Results:  Basename 12/13/10 0600 12/11/10 1926  WBC 6.7 7.4  HGB 12.1* 12.4*  PLT 154 164    Basename 12/13/10 0600 12/12/10 0630  NA 140 139  K 5.2* 4.9  CL 106 105  CO2 27 25  GLUCOSE 129* 129*  BUN 23 23  CREATININE 1.19 1.16   No results found for this basename: TROPONINI:2,CK,MB:2 in the last 72 hours Hepatic Function Panel No results found for this basename: PROT,ALBUMIN,AST,ALT,ALKPHOS,BILITOT,BILIDIR,IBILI in the last 72 hours  Basename 12/12/10 0630  CHOL 141   No results found for  this basename: PROTIME in the last 72 hours  Imaging: Imaging results have been reviewed  Cardiac Studies: See cath report of 12/13/10 Assessment/Plan:  Patient Active Problem List  Diagnoses  . DYSLIPIDEMIA  . GOUT, UNSPECIFIED  . OBSTRUCTIVE SLEEP APNEA  . ESSENTIAL HYPERTENSION, BENIGN  . CARDIOMYOPATHY, DILATED  . CHRONIC SYSTOLIC HEART FAILURE  . ALLERGIC RHINITIS  . RENAL DISEASE, CHRONIC, MILD  . Deep venous thrombosis of right upper extremity       Plan:  Okay for discharge today.  Med. Follow-up with Dr. Sherryll Burger in [place].  Cards followup with Dr. Andee Lineman in 1-2 weeks.      Continue present cardiac meds. Must lose weight.  Needs to use his CPAP machine.  LOS: 3 days    [name] 12/14/2010, 8:56 AM

## 2010-12-14 NOTE — Plan of Care (Signed)
Problem: Phase II Progression Outcomes Goal: Cath/PCI Day Path if indicated Outcome: Completed/Met Date Met:  12/14/10 Clean cath Goal: CV Risk Factors identified Outcome: Completed/Met Date Met:  12/14/10 Pt to f/u op for DM management/wt loss Compliance with cpap reinforced  Problem: Phase III Progression Outcomes Goal: Vascular site scale level 0 - I Vascular Site Scale Level 0: No bruising/bleeding/hematoma Level I (Mild): Bruising/Ecchymosis, minimal bleeding/ooozing, palpable hematoma < 3 cm Level II (Moderate): Bleeding not affecting hemodynamic parameters, pseudoaneurysm, palpable hematoma > 3 cm  Outcome: Completed/Met Date Met:  12/14/10 Level 0  Problem: Discharge Progression Outcomes Goal: Vascular site scale level 0 - I Vascular Site Scale Level 0: No bruising/bleeding/hematoma Level I (Mild): Bruising/Ecchymosis, minimal bleeding/ooozing, palpable hematoma < 3 cm Level II (Moderate): Bleeding not affecting hemodynamic parameters, pseudoaneurysm, palpable hematoma > 3 cm  Outcome: Completed/Met Date Met:  12/14/10 Level 0  Problem: Consults Goal: Nutrition Consult-if indicated Outcome: Completed/Met Date Met:  12/14/10 OP DM/weight loss assistance arranged

## 2010-12-19 ENCOUNTER — Other Ambulatory Visit: Payer: Self-pay | Admitting: *Deleted

## 2010-12-19 DIAGNOSIS — E875 Hyperkalemia: Secondary | ICD-10-CM

## 2011-01-02 ENCOUNTER — Encounter: Payer: Self-pay | Admitting: Physician Assistant

## 2011-01-02 ENCOUNTER — Encounter: Payer: Medicare Other | Admitting: Cardiology

## 2011-01-02 ENCOUNTER — Ambulatory Visit (INDEPENDENT_AMBULATORY_CARE_PROVIDER_SITE_OTHER): Payer: Medicare Other | Admitting: Cardiology

## 2011-01-02 VITALS — BP 99/64 | HR 84 | Ht 72.0 in | Wt 338.0 lb

## 2011-01-02 DIAGNOSIS — I428 Other cardiomyopathies: Secondary | ICD-10-CM

## 2011-01-02 DIAGNOSIS — G4733 Obstructive sleep apnea (adult) (pediatric): Secondary | ICD-10-CM

## 2011-01-02 DIAGNOSIS — I10 Essential (primary) hypertension: Secondary | ICD-10-CM

## 2011-01-02 MED ORDER — AMOXICILLIN-POT CLAVULANATE 875-125 MG PO TABS: 1.0000 | ORAL_TABLET | Freq: Two times a day (BID) | ORAL | Status: AC

## 2011-01-02 MED ORDER — FLUTICASONE PROPIONATE 50 MCG/ACT NA SUSP: 2.0000 | Freq: Every day | NASAL | Status: DC

## 2011-01-02 NOTE — Assessment & Plan Note (Signed)
The patient has significantly associated pulmonary hypertension as measured by his recent catheterization. He also has RV dysfunction by echocardiogram. I explained to him the risks of not using his CPAP device. I tried to explain to him that he needs to be compliant with this and also should try to reduce his weight. I recommended decreasing calorie intake.

## 2011-01-02 NOTE — Progress Notes (Signed)
Mark Bottoms, MD, [facility] ABIM Board Certified in Adult Cardiovascular Medicine,Internal Medicine and Critical Care Medicine    CC: Followup patient with nonischemic cardiomyopathy  HPI:  The patient is a 70 year old male with a history of nonischemic dilated cardiomyopathy and a catheterization 2004. He has had variable ejection fractions over the last several years but in 2008 his ejection fraction was 55%. I saw him recently in the office in July and with a limited hand-held bedside echocardiogram felt his ejection fraction was around 50%. The patient was recently admitted for chest pain and had a very abnormal nuclear perfusion study. His ejection fraction was reported at 23% with multiple wall motion abnormalities. He was referred for cardiac catheterization but had essentially normal coronary arteries. No ventriculogram was performed. 2-D echocardiogram at that time reported an ejection fraction of 45% although no significant RV dysfunction was reported I reviewed the echocardiogram and there was mild to moderate RV dysfunction. Also the right heart catheterization showed severe pulmonary hypertension with PA pressures of approximately 60 mm of mercury. The patient has a history of obstructive sleep apnea and is noncompliant with CPAP. He now reports no recurrent chest pain. He has no orthopnea PND palpitations or syncope. He stable from a cardiovascular perspective. The patient does report cough, postnasal drip and sinus congestion.    PMH: reviewed and listed in Problem List in Electronic Records (and see below) Past Medical History  Diagnosis Date  . Nonischemic dilated cardiomyopathy Normal coronary arteries by catheterization November 2012  Ejection fraction by echocardiogram personally 45% with mild to moderate RV dysfunction  False-positive nuclear perfusion study 11 2012 at [facility] with an ejection fraction of 23% [not accurate]    . Mild renal insufficiency   . Hyperkalemia     . Hypertension   . Obstructive sleep apnea   . Dyslipidemia   . Diabetes mellitus   . Hyperlipidemia   . DVT of upper extremity (deep vein thrombosis) 2012    right arm  . Sinus drainage   . Blood transfusion   . Anemia   . Chronic kidney disease   . Headache     "stopped when I was a teen"  . Depression   . Arthritis   . Gout     right foot  . Excessive flatus      Allergies/SH/FHX : available in Electronic Records for review  Medications: Current Outpatient Prescriptions  Medication Sig Dispense Refill  . amLODipine (NORVASC) 10 MG tablet Take 10 mg by mouth daily.        Marland Kitchen aspirin (ASPIR-81) 81 MG EC tablet Take 81 mg by mouth daily.        . carvedilol (COREG) 25 MG tablet Take 25 mg by mouth 2 (two) times daily with a meal.       . Cetirizine HCl 10 MG CAPS Take 10 mg by mouth daily.       . colchicine (COLCRYS) 0.6 MG tablet Take 0.6 mg by mouth daily as needed. For gout pain      . DULoxetine (CYMBALTA) 30 MG capsule Take 30 mg by mouth daily.        Marland Kitchen dutasteride (AVODART) 0.5 MG capsule Take 0.5 mg by mouth daily.       . furosemide (LASIX) 40 MG tablet Take 40 mg by mouth every morning.        Marland Kitchen HYDROcodone-acetaminophen (VICODIN) 5-500 MG per tablet Take 1 tablet by mouth 2 (two) times daily as needed. For pain      .  lisinopril (PRINIVIL,ZESTRIL) 40 MG tablet Take 40 mg by mouth daily.        . pravastatin (PRAVACHOL) 40 MG tablet Take 40 mg by mouth daily.        Marland Kitchen amoxicillin-clavulanate (AUGMENTIN) 875-125 MG per tablet Take 1 tablet by mouth 2 (two) times daily.  14 tablet  0  . fluticasone (FLONASE) 50 MCG/ACT nasal spray Place 2 sprays into the nose daily.  16 g  1    ROS: No nausea or vomiting. No fever or chills.No melena or hematochezia.No bleeding.No claudication  Physical Exam: BP 99/64  Pulse 84  Ht 6' (1.829 m)  Wt 338 lb (153.316 kg)  BMI 45.84 kg/m2 General: Overweight African American male in no distresswith HEENT: Frontal sinus  tenderness on palpation also bilateral maxillary sinus tenderness on physical exam No further physical examination was performed  12lead ECG: Limited bedside ECHO:N/A  Counseling was provided regarding the current medical condition and included: . Diagnosis, impressions, prognosis, recommended diagnostic studies  . Risks and benefits of treatment options  . Instructions for management, treatment and/or follow-up care  . Importance of compliance with treatment, risk factor reduction  . Patient and/or family education    Time spent counseling was 45 minutes and recorded in the Problem List.   Assessment and Plan

## 2011-01-02 NOTE — Assessment & Plan Note (Signed)
Blood pressures controlled. If anything the patient's blood pressures on the low side and we may need to adjust his medications in the future. I told him to carefully monitor his blood pressure. He should also report any symptoms of dizziness.

## 2011-01-02 NOTE — Assessment & Plan Note (Signed)
Patient has mild LV dysfunction only. I discussed with him in great length the variable measurements by ejection fraction and testing. I also told him about false-positive nuclear perfusion study and that he should probably not be repeated in the future. I also explained that he needs to stay on his ACE inhibitor and beta blocker to reduce his morbidity and mortality related to his LV dysfunction.

## 2011-01-02 NOTE — Patient Instructions (Signed)
   Zyrtec 10mg  daily - can buy over-the-counter  Flonase Nasal Spray - 2 sprays daily, up to twice a day x 10 days  Augmentin 875mg  twice a day  X 7 days  Continue all current cardiac medications.  Your physician wants you to follow up in: 6 months.  You will receive a reminder letter in the mail one-two months in advance.  If you don't receive a letter, please call our office to schedule the follow up appointment

## 2011-01-10 ENCOUNTER — Other Ambulatory Visit: Payer: Self-pay | Admitting: Cardiology

## 2011-06-05 ENCOUNTER — Other Ambulatory Visit: Payer: Self-pay | Admitting: Cardiology

## 2011-07-20 ENCOUNTER — Other Ambulatory Visit: Payer: Self-pay

## 2011-08-14 ENCOUNTER — Encounter: Payer: Self-pay | Admitting: Cardiovascular Disease

## 2011-08-14 ENCOUNTER — Ambulatory Visit (INDEPENDENT_AMBULATORY_CARE_PROVIDER_SITE_OTHER): Payer: 59 | Admitting: Cardiovascular Disease

## 2011-08-14 VITALS — BP 110/64 | HR 83 | Ht 72.0 in | Wt 332.8 lb

## 2011-08-14 DIAGNOSIS — I10 Essential (primary) hypertension: Secondary | ICD-10-CM

## 2011-08-14 DIAGNOSIS — I5022 Chronic systolic (congestive) heart failure: Secondary | ICD-10-CM

## 2011-08-14 NOTE — Patient Instructions (Addendum)
Continue same medications.  Follow up in 6 months.  

## 2011-08-14 NOTE — Assessment & Plan Note (Signed)
His blood pressure is well controlled on current medication. 

## 2011-08-14 NOTE — Progress Notes (Signed)
HPI  The patient is a 71 year old male who is here today for a followup visit. He has a history of nonischemic dilated cardiomyopathy on a catheterization in 2004. Most recent ejection fraction was 40% by echocardiogram. He had cardiac catheterization done last year after an abnormal nuclear  perfusion study. Cardiac cath showed mild CAD without evidence of obstructive disease.there was evidence of severe pulmonary hypertension which was attributed to sleep apnea with poor adherence to CPAP. Overall, the patient has been doing reasonably well. He denies any chest pain, orthopnea or PND. His dyspnea is stable.   Allergies  Allergen Reactions  . Codeine Other (See Comments)    Bones ache  . Warfarin Sodium     REACTION: possible Coumadin induced necrosis     Current Outpatient Prescriptions on File Prior to Visit  Medication Sig Dispense Refill  . amLODipine (NORVASC) 10 MG tablet TAKE 1 TABLET ONCE DAILY  30 tablet  6  . aspirin (ASPIR-81) 81 MG EC tablet Take 81 mg by mouth daily.        . carvedilol (COREG) 25 MG tablet Take 25 mg by mouth 2 (two) times daily with a meal.       . Cetirizine HCl 10 MG CAPS Take 10 mg by mouth daily.       . DULoxetine (CYMBALTA) 30 MG capsule Take 30 mg by mouth daily.        Marland Kitchen dutasteride (AVODART) 0.5 MG capsule Take 0.5 mg by mouth daily.       . fluticasone (FLONASE) 50 MCG/ACT nasal spray Place 2 sprays into the nose daily.  16 g  1  . furosemide (LASIX) 40 MG tablet Take 40 mg by mouth every morning.        Marland Kitchen HYDROcodone-acetaminophen (VICODIN) 5-500 MG per tablet Take 1 tablet by mouth 2 (two) times daily as needed. For pain      . lisinopril (PRINIVIL,ZESTRIL) 40 MG tablet Take 40 mg by mouth daily.        . pravastatin (PRAVACHOL) 40 MG tablet Take 40 mg by mouth daily.        . colchicine (COLCRYS) 0.6 MG tablet Take 0.6 mg by mouth daily as needed. For gout pain         Past Medical History  Diagnosis Date  . Nonischemic dilated  cardiomyopathy   . Mild renal insufficiency   . Hyperkalemia   . Hypertension   . Obstructive sleep apnea   . Dyslipidemia   . Diabetes mellitus   . Hyperlipidemia   . DVT of upper extremity (deep vein thrombosis) 2012    right arm  . Sinus drainage   . Blood transfusion   . Anemia   . Chronic kidney disease   . Headache     "stopped when I was a teen"  . Depression   . Arthritis   . Gout     right foot  . Excessive flatus      Past Surgical History  Procedure Date  . Bunionectomy     right foot  . Carpal tunnel release     right hand  . Joint replacement   . Total hip arthroplasty     left  . Total knee arthroplasty     right  . Appendectomy 1961  . Tonsillectomy and adenoidectomy ~ 1950  . Cataract extraction w/ intraocular lens  implant, bilateral      No family history on file.   History   Social  History  . Marital Status: Single    Spouse Name: N/A    Number of Children: N/A  . Years of Education: N/A   Occupational History  . Not on file.   Social History Main Topics  . Smoking status: Former Smoker -- 1.0 packs/day for 15 years    Types: Cigarettes    Quit date: 01/20/1982  . Smokeless tobacco: Not on file   Comment: stopped smoking cigarettes 1984  . Alcohol Use: Yes     "stopped drinking alcohol 1984"  . Drug Use: No  . Sexually Active: No   Other Topics Concern  . Not on file   Social History Narrative  . No narrative on file     PHYSICAL EXAM   BP 110/64  Pulse 83  Ht 6' (1.829 m)  Wt 332 lb 12.8 oz (150.957 kg)  BMI 45.14 kg/m2  SpO2 95%  Constitutional: He is oriented to person, place, and time. He he is morbidly obese. No distress.  HENT: No nasal discharge.  Head: Normocephalic and atraumatic.  Eyes: Pupils are equal and round. Right eye exhibits no discharge. Left eye exhibits no discharge.  Neck: Normal range of motion. Neck supple. No JVD present. No thyromegaly present.  Cardiovascular: Normal rate, regular  rhythm, normal heart sounds and. Exam reveals no gallop and no friction rub. No murmur heard.  Pulmonary/Chest: Effort normal and breath sounds normal. No stridor. No respiratory distress. He has no wheezes. He has no rales. He exhibits no tenderness.  Abdominal: Soft. Bowel sounds are normal. He exhibits no distension. There is no tenderness. There is no rebound and no guarding.  Musculoskeletal: Normal range of motion. He exhibits trace edema and no tenderness.  Neurological: He is alert and oriented to person, place, and time. Coordination normal.  Skin: Skin is warm and dry. No rash noted. He is not diaphoretic. No erythema. No pallor.  Psychiatric: He has a normal mood and affect. His behavior is normal. Judgment and thought content normal.       ASSESSMENT AND PLAN

## 2011-08-14 NOTE — Assessment & Plan Note (Signed)
He seems to be stable from a cardiac standpoint. He appears to be euvolemic. His blood pressure is well controlled and he is on good medications. No changes will be made today.

## 2011-10-21 DIAGNOSIS — R079 Chest pain, unspecified: Secondary | ICD-10-CM

## 2011-10-21 DIAGNOSIS — I472 Ventricular tachycardia: Secondary | ICD-10-CM

## 2011-10-22 DIAGNOSIS — R079 Chest pain, unspecified: Secondary | ICD-10-CM

## 2011-10-23 ENCOUNTER — Encounter (HOSPITAL_COMMUNITY): Payer: Self-pay | Admitting: *Deleted

## 2011-10-23 ENCOUNTER — Other Ambulatory Visit: Payer: Self-pay | Admitting: Physician Assistant

## 2011-10-23 ENCOUNTER — Inpatient Hospital Stay (HOSPITAL_COMMUNITY)
Admission: AD | Admit: 2011-10-23 | Discharge: 2011-10-29 | DRG: 225 | Disposition: A | Discharge status: Skilled Nursing Facility | Payer: PRIVATE HEALTH INSURANCE | Source: Other Acute Inpatient Hospital | Attending: Cardiology | Admitting: Cardiology

## 2011-10-23 DIAGNOSIS — I2510 Atherosclerotic heart disease of native coronary artery without angina pectoris: Secondary | ICD-10-CM

## 2011-10-23 DIAGNOSIS — I129 Hypertensive chronic kidney disease with stage 1 through stage 4 chronic kidney disease, or unspecified chronic kidney disease: Secondary | ICD-10-CM | POA: Diagnosis present

## 2011-10-23 DIAGNOSIS — E785 Hyperlipidemia, unspecified: Secondary | ICD-10-CM | POA: Diagnosis present

## 2011-10-23 DIAGNOSIS — I472 Ventricular tachycardia, unspecified: Principal | ICD-10-CM | POA: Diagnosis present

## 2011-10-23 DIAGNOSIS — J302 Other seasonal allergic rhinitis: Secondary | ICD-10-CM

## 2011-10-23 DIAGNOSIS — N182 Chronic kidney disease, stage 2 (mild): Secondary | ICD-10-CM | POA: Diagnosis present

## 2011-10-23 DIAGNOSIS — I10 Essential (primary) hypertension: Secondary | ICD-10-CM

## 2011-10-23 DIAGNOSIS — E119 Type 2 diabetes mellitus without complications: Secondary | ICD-10-CM | POA: Diagnosis present

## 2011-10-23 DIAGNOSIS — I5022 Chronic systolic (congestive) heart failure: Secondary | ICD-10-CM | POA: Diagnosis present

## 2011-10-23 DIAGNOSIS — Z87891 Personal history of nicotine dependence: Secondary | ICD-10-CM

## 2011-10-23 DIAGNOSIS — I4729 Other ventricular tachycardia: Principal | ICD-10-CM | POA: Diagnosis present

## 2011-10-23 DIAGNOSIS — I4891 Unspecified atrial fibrillation: Secondary | ICD-10-CM | POA: Diagnosis present

## 2011-10-23 DIAGNOSIS — N400 Enlarged prostate without lower urinary tract symptoms: Secondary | ICD-10-CM

## 2011-10-23 DIAGNOSIS — N181 Chronic kidney disease, stage 1: Secondary | ICD-10-CM | POA: Diagnosis present

## 2011-10-23 DIAGNOSIS — N289 Disorder of kidney and ureter, unspecified: Secondary | ICD-10-CM | POA: Diagnosis present

## 2011-10-23 DIAGNOSIS — I2789 Other specified pulmonary heart diseases: Secondary | ICD-10-CM | POA: Diagnosis present

## 2011-10-23 DIAGNOSIS — I428 Other cardiomyopathies: Secondary | ICD-10-CM | POA: Diagnosis present

## 2011-10-23 DIAGNOSIS — R5381 Other malaise: Secondary | ICD-10-CM | POA: Diagnosis present

## 2011-10-23 DIAGNOSIS — I4901 Ventricular fibrillation: Secondary | ICD-10-CM

## 2011-10-23 DIAGNOSIS — E875 Hyperkalemia: Secondary | ICD-10-CM | POA: Diagnosis present

## 2011-10-23 DIAGNOSIS — G4733 Obstructive sleep apnea (adult) (pediatric): Secondary | ICD-10-CM | POA: Diagnosis present

## 2011-10-23 DIAGNOSIS — N390 Urinary tract infection, site not specified: Secondary | ICD-10-CM | POA: Diagnosis present

## 2011-10-23 DIAGNOSIS — Z86718 Personal history of other venous thrombosis and embolism: Secondary | ICD-10-CM

## 2011-10-23 DIAGNOSIS — Z7982 Long term (current) use of aspirin: Secondary | ICD-10-CM

## 2011-10-23 DIAGNOSIS — M109 Gout, unspecified: Secondary | ICD-10-CM

## 2011-10-23 DIAGNOSIS — Z6841 Body Mass Index (BMI) 40.0 and over, adult: Secondary | ICD-10-CM

## 2011-10-23 HISTORY — DX: Ventricular tachycardia, unspecified: I47.20

## 2011-10-23 HISTORY — DX: Ventricular tachycardia: I47.2

## 2011-10-23 LAB — GLUCOSE, CAPILLARY: Glucose-Capillary: 172 mg/dL — ABNORMAL HIGH (ref 70–99)

## 2011-10-23 LAB — CBC
MCHC: 32.7 g/dL (ref 30.0–36.0)
MCV: 94.7 fL (ref 78.0–100.0)
Platelets: 161 10*3/uL (ref 150–400)
RDW: 13.2 % (ref 11.5–15.5)
WBC: 7.5 10*3/uL (ref 4.0–10.5)

## 2011-10-23 LAB — CREATININE, SERUM: GFR calc Af Amer: 80 mL/min — ABNORMAL LOW (ref >90)

## 2011-10-23 MED ORDER — DULOXETINE HCL 30 MG PO CPEP
30.0000 mg | ORAL_CAPSULE | Freq: Every day | ORAL | Status: DC
  Administered 2011-10-24 – 2011-10-29 (×6): 30 mg via ORAL
  Filled 2011-10-23 (×6): qty 1

## 2011-10-23 MED ORDER — AMIODARONE IV BOLUS ONLY 150 MG/100ML
150.0000 mg | Freq: Once | INTRAVENOUS | Status: AC
  Administered 2011-10-23: 150 mg via INTRAVENOUS

## 2011-10-23 MED ORDER — SODIUM CHLORIDE 0.9 % IJ SOLN
3.0000 mL | INTRAMUSCULAR | Status: DC | PRN
  Administered 2011-10-24: 3 mL via INTRAVENOUS

## 2011-10-23 MED ORDER — AMLODIPINE BESYLATE 10 MG PO TABS
10.0000 mg | ORAL_TABLET | Freq: Every day | ORAL | Status: DC
  Administered 2011-10-24 – 2011-10-29 (×6): 10 mg via ORAL
  Filled 2011-10-23 (×6): qty 1

## 2011-10-23 MED ORDER — INFLUENZA VIRUS VACC SPLIT PF IM SUSP
0.5000 mL | INTRAMUSCULAR | Status: AC
  Administered 2011-10-24: 0.5 mL via INTRAMUSCULAR
  Filled 2011-10-23: qty 0.5

## 2011-10-23 MED ORDER — SODIUM CHLORIDE 0.9 % IJ SOLN
3.0000 mL | Freq: Two times a day (BID) | INTRAMUSCULAR | Status: DC
  Administered 2011-10-23 – 2011-10-24 (×2): 3 mL via INTRAVENOUS
  Administered 2011-10-24: 9 mL via INTRAVENOUS
  Administered 2011-10-25 – 2011-10-26 (×3): 3 mL via INTRAVENOUS

## 2011-10-23 MED ORDER — LORATADINE 10 MG PO TABS
10.0000 mg | ORAL_TABLET | Freq: Every day | ORAL | Status: DC
  Administered 2011-10-24 – 2011-10-29 (×6): 10 mg via ORAL
  Filled 2011-10-23 (×6): qty 1

## 2011-10-23 MED ORDER — FUROSEMIDE 40 MG PO TABS
40.0000 mg | ORAL_TABLET | Freq: Every day | ORAL | Status: DC
  Administered 2011-10-25 – 2011-10-26 (×2): 40 mg via ORAL
  Filled 2011-10-23 (×4): qty 1

## 2011-10-23 MED ORDER — NITROGLYCERIN 0.4 MG SL SUBL: 0.4000 mg | SUBLINGUAL_TABLET | SUBLINGUAL | Status: DC | PRN

## 2011-10-23 MED ORDER — ATORVASTATIN CALCIUM 10 MG PO TABS
10.0000 mg | ORAL_TABLET | Freq: Every day | ORAL | Status: DC
  Administered 2011-10-24 – 2011-10-28 (×5): 10 mg via ORAL
  Filled 2011-10-23 (×6): qty 1

## 2011-10-23 MED ORDER — LISINOPRIL 40 MG PO TABS
40.0000 mg | ORAL_TABLET | Freq: Every day | ORAL | Status: DC
  Administered 2011-10-24 – 2011-10-29 (×6): 40 mg via ORAL
  Filled 2011-10-23 (×6): qty 1

## 2011-10-23 MED ORDER — ASPIRIN EC 81 MG PO TBEC
81.0000 mg | DELAYED_RELEASE_TABLET | Freq: Every day | ORAL | Status: DC
  Administered 2011-10-24 – 2011-10-29 (×6): 81 mg via ORAL
  Filled 2011-10-23 (×6): qty 1

## 2011-10-23 MED ORDER — FINASTERIDE 5 MG PO TABS
5.0000 mg | ORAL_TABLET | Freq: Every day | ORAL | Status: DC
  Administered 2011-10-23 – 2011-10-25 (×3): 5 mg via ORAL
  Filled 2011-10-23 (×4): qty 1

## 2011-10-23 MED ORDER — SODIUM CHLORIDE 0.9 % IV SOLN: 250.0000 mL | INTRAVENOUS | Status: DC | PRN

## 2011-10-23 MED ORDER — HEPARIN SODIUM (PORCINE) 5000 UNIT/ML IJ SOLN
5000.0000 [IU] | Freq: Three times a day (TID) | INTRAMUSCULAR | Status: DC
  Administered 2011-10-23 – 2011-10-26 (×8): 5000 [IU] via SUBCUTANEOUS
  Filled 2011-10-23 (×11): qty 1

## 2011-10-23 MED ORDER — ACETAMINOPHEN 325 MG PO TABS
650.0000 mg | ORAL_TABLET | ORAL | Status: DC | PRN
  Administered 2011-10-25: 650 mg via ORAL
  Filled 2011-10-23: qty 2

## 2011-10-23 MED ORDER — CARVEDILOL 25 MG PO TABS
25.0000 mg | ORAL_TABLET | Freq: Two times a day (BID) | ORAL | Status: DC
  Administered 2011-10-23 – 2011-10-29 (×12): 25 mg via ORAL
  Filled 2011-10-23 (×15): qty 1

## 2011-10-23 MED ORDER — AMIODARONE HCL IN DEXTROSE 360-4.14 MG/200ML-% IV SOLN
60.0000 mg/h | INTRAVENOUS | Status: DC
  Administered 2011-10-23 – 2011-10-24 (×4): 60 mg/h via INTRAVENOUS
  Filled 2011-10-23 (×6): qty 200

## 2011-10-23 NOTE — Progress Notes (Signed)
With recurrent VT plan is transfer to [facility] for EP consultation. Will need EP consultation and likely catheterization to exclude ischemic MMVT. Probably not a great candidate for ICD but will defer to EP. Of note, patient has been most recently seen in office by Dr. Kirke Corin.

## 2011-10-23 NOTE — Progress Notes (Signed)
Please see below for [name] progress notes from today. Up until this AM, his rhythm had remained stable off lidocaine gtt. The initial plan was to initiate PO amiodarone today and discharge home after ambulation. However, upon ambulation, the patient developed runs of sustained VT once more. He spontaneously converted back to NSR without defibrillation or medication. Discharge will be cancelled. Per discussion with [name], will transfer to [facility] for evaluation by our EP team. [name] feels he may need repeat cath to exclude ischemia as a contributing factor. He has a hx of false positive Myoview in the past so it is unclear if stress testing would be completely accurate (with body habitus confounding). The patient does have a hx of CKD stage I-II but renal function has remained stable and Cr is 1.07 today. Will make NPO on transfer in case EP decides this is necessary today. Plans made to transfer pt to stepdown at [facility] today. [name] and [facility] made aware. Orders will be written. Note that the PO amiodarone that was going to be started today will not be continued at [facility] -- will await EP input.  I was unable to physically enter the Cymbalta 30mg  order by epic so the pharmacy team is working on entering that as a Warehouse manager and Held order.  2D Echo from 10/22/11 should be on shadow chart. Findings: Mild concentric LVH. Global hypokinesis of LV with minor regional variation. EF 35-40%. Abnormal LV diastolic filling is observed, consistent with impaired relaxation. The LV diastolic filling pattern is consistent with elevated left atrial pressure. LA mildly dilated. MV leaflets mildly thickened. Mild MR. Trace TR. Unable to estimate RVSP. No pericardial effusion.  Please note there was variation of his EF back in 11/2009 in his EF. By echo in 12/12/11, EF was 40%. By cath 12/13/11, EF was 55-65%.  Mark Roylance PA-C 10/23/2011 11:18  AM  ------------------------------------  NAME:  [name]                 ROOM:          214-01                    UNIT NUMBER:  [id number]                          ADM PHYSICIAN: Mark Peri MD            ADM DATE:     10/21/11                        DOB:           [birth year]       ACCT: [id number]                            PROGRESS REPORT                            DATE OF VISIT:  10/23/2011                SUBJECTIVE:  The patient is walking around in the room, denies any chest pain,       palpitations, or dizziness.               OBJECTIVE:  Vital signs:  Temperature 97.7 degrees, blood pressure 135/82,  heart rate 67 and regular, respirations 24, oxygen saturation 97% on 2 liters       nasal cannula, weight 327 pounds.  Review of telemetry shows no further       ventricular tachycardia, only rare PVC.  Lungs:  Clear with diminished breath       sounds.  Cardiac:  Distant, regular heart sounds, indistinct PMI.  Extremities:       Chronic-appearing edema.               LABORATORY DATA:       1.   BUN 25, creatinine 1.0, sodium 137, potassium 4.2, magnesium 2.0, TSH 1.2.            Troponin I peak 0.03.  BNP 30.  Hemoglobin 13.8, platelets 202,000.       2.   Follow-up echocardiogram:  From 10/22/2011:  Shows mild LV dilatation with            mild LVH, stable LVEF at 35% to 40% with diastolic dysfunction and            increased left atrial pressures.  Mild left atrial enlargement.  Mildly            thickened mitral valve with mild mitral regurgitation, trace tricuspid            regurgitation, no pericardial effusion.               ASSESSMENT:       1.   Paroxysmal sustained ventricular tachycardia, resolved, in the setting of            nonischemic cardiomyopathy with stable left ventricular ejection fraction            of 35% to 40%.  No evidence of acute coronary syndrome based on cardiac            markers.  He has been off lidocaine since yesterday morning,  and has had            sinus rhythm by telemetry, no episodic chest pain, palpitations, or            dizziness.  Cardiac catheterization last year demonstrated no significant            obstructive coronary artery disease.       2.   Morbid obesity.       3.   Obstructive sleep apnea, noncompliant with CPAP with a history of            pulmonary hypertension.       4.   Chronic kidney disease, stage I.       5.   Hypertension, blood pressure controlled.               PLAN:  I reviewed the situation with the patient.  After discussion of various       options, our plan at this time is to continue his baseline medical regimen with       the addition of amiodarone 400 mg once a day.  We will arrange an office       follow-up visit over the next few weeks for clinical reassessment.  It seems       unlikely that progressive ischemic heart disease is necessarily to blame in       light of his reassuring cardiac catheterization from last year, although       certainly we can consider further ischemic workup if his symptoms  recur or       progress.  Frankly, candidacy for device therapy is suboptimal and would prefer         to continue with medical therapy and observation at this point.  He is in       agreement.  He will ambulate in the hall today, and if he does well, can likely       be discharged later this afternoon.  We are initiating amiodarone 400 mg daily,       and this should be continued as a new medicine when he is discharged.                               DICTATED NOT READ        (unless electronically signed)               [name]                                         __________________________________       MD:                                                                                                                                    __________________________________                                                    Mark Dell, MD, [facility]            D:  10/23/11  1308       T:  10/23/11 6578       P:  [id number]             DT: PS       UI: [id number]       copy to: Medical Records                      cc:

## 2011-10-23 NOTE — Progress Notes (Signed)
Total time spent in patient care greater than 1 hour, not half hour.

## 2011-10-23 NOTE — Progress Notes (Signed)
Patient ID: [name], male   DOB: Sep 12, 1940, 71 y.o.   MRN: [id number] Mr. Desorbo developed runs of wide-complex tachycardia in route via [facility]. Hemodynamics were stable with a blood pressure 1:30. Heart rate is running about 150 to 160s. 12-lead was obtained which looked like possible A. fib with rapid ventricular rate conduction delay. However after further review it may all be VT since his axis does shift.  He was given 150 mg IV amiodarone in route per my request. He has now been put on a 60 mg per hour drip with her amiodarone protocol. Potassium was above for this morning. He is awaiting EP consultation.  Total time spent in care was greater than or equal to one half hour. We will also send off a magnesium level. He is currently stable and being monitored here in the CCU.

## 2011-10-23 NOTE — H&P (Signed)
NAME:  [name],[name]               ROOM:          250-04                      UNIT NUMBER:  [id number]                        LOCATION:      [facility]             ADM/VISIT DATE:     10/21/11                ADM Vaughan BrownerKirstie Peri MD              ACCT: [id number]                               DOB:           [birth year]         CARDIOLOGY CONSULTATION REPORT                         DATE OF ADMISSION: 10/21/11 DATE OF CONSULT NOTE: 10/01/3         PRIMARY CARDIOLOGIST:  Rachel Moulds, M.D.         CHIEF COMPLAINT:  Dizziness, heartburn sensation.          REASON FOR CONSULTATION:  Chest pain, VT.         HISTORY OF PRESENT ILLNESS:  [name] is a 71 year old gentleman with a history of nonischemic cardiomyopathy, nonobstructive coronary artery disease by cath in November 2012, diabetes mellitus, obstructive sleep apnea with pulmonary hypertension, chronic kidney disease, and prior DVT after a Port-A-Cath implantation for UTI treatment.  He has a history of LV dysfunction, with EF of 35% to 40% by echo in 2010; in November, his EF was 40%.  He underwent subsequent catheterization, on 12/13/2010, demonstrating nonobstructive coronary artery disease, with 20% to 30% LAD.  He presented to [facility] with complaints of heartburn and dizziness sensations.  This morning, he had 2 episodes of chest discomfort radiating up into his throat that he described like indigestion, lasting several minutes, associated with about 1/2 minute of dizziness.  He also reports an episode approximately a week ago that was self-resolving as well.  He denies any syncope, visual changes, change in his chronic low-grade shortness of breath, or palpitations.  He has no recent change in his exertional capabilities, or dizziness otherwise.         In the ER, he was found to have initially frequent ectopy, with runs of 1-2 minutes of monomorphic VT at a time.  He was apparently asymptomatic during these episodes,  and the patient states that since he has been in the ER he has not had any  further recurrence of the heartburn/chest pain.  He was bolused with lidocaine and placed on a drip.  He did not require emergent defibrillation.  Since that time, his ectopy has minimalized, and he is now maintaining sinus rhythm in the 70s.  Blood pressure has remained stable.         He has otherwise been compliant with his medications, but does report recent addition of Cipro 500 mg b.i.d. starting 10/14/2011 for a sinus infection that has since improved.  PAST MEDICAL HISTORY: 1.   Chest pain with false-positive nuclear stress test in November 2012, with catheterization revealing nonobstructive coronary disease, with 20% to 30% LAD by cath 12/13/2010. 2.   Nonischemic cardiomyopathy.  Prior history of LVEF of 35% to 40% in 2010,  with EF of 40% by echo in November 2012 and 55% to 65% by cath around the same time. 3.   Obesity. 4.   Obstructive sleep apnea, with history of noncompliance with CPAP, with resultant rpulmonary hypertension attributed to this. 5.   Pulmonary hypertension, see above. 6.   History of hyperkalemia. 7.   Diabetes mellitus. 8.   Hypertension. 9.   Hyperlipidemia. 10.  Chronic kidney disease, creatinine of 1.47, June 2013. 11.  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 12.  History of Port-A-Cath implantation for multidrug-resistant UTI treatment with IV vancomycin. 13.  Appendectomy. 14.  Carpal tunnel surgery. 15.  History of hip surgery.         MEDICATIONS:  [name] is on: 1.   Norvasc 10 mg daily. 2.   Aspirin 81 mg daily. 3.   Coreg 25 mg b.i.d. 4.   Zyrtec 10 mg daily. 5.   Cymbalta 30 mg daily. 6.   Avodart 0.5 mg daily. 7.   Flonase 50 mcg 2 sprays daily. 8.   Lasix 40 mg daily. 9.   Vicodin 5/500 mg 1 tablet b.i.d. p.r.n. 10.  Lisinopril 40 mg daily. 11.  Pravachol 40 mg  daily. 12.  Colchicine 0.6 mg p.r.n. 13.  Cipro 500 mg b.i.d.         ALLERGIES:  COUMADIN MAY HAVE CAUSED SKIN NECROSIS IN THE PAST.  HE IS ALSO  ALLERGIC TO CODEINE.         SOCIAL HISTORY:  Patient is single.  He has former tobacco use.  He previously quit alcohol.  He vehemently denies any drug use, including cocaine, heroin, or marijuana.  He is able to walk around but uses a cane occasionally.         FAMILY HISTORY:  Remarkable for premature coronary artery disease.  His brother had heart disease and diabetes.  Mother had CVA.  Father had cancer.         REVIEW OF SYSTEMS:  Please see HPI for pertinent positives.  Otherwise, no change in his weight, no orthopnea, PND, lower extremity edema, bleeding problems, or weakness/paralysis.  All other systems reviewed and otherwise negative, except for those noted above.              LABORATORIES:  Sodium 140, potassium 3.9, chloride 104, CO2 of 26, glucose 220, BUN 36, creatinine 1.51, calcium 8.9.  CK 220 then 221.  MB 4.3 then 4.1. troponin negative x2.  INR 1.0.  BNP 30.  WBC 7300, hemoglobin 13.8, hematocrit 41.5, platelet count 202,000, magnesium 2.1.         STUDIES:       1.   Chest x-ray showed limited secondary to body habitus, mild basilar atelectasis, no focal infiltrate or edema.       2.   EKG #1 on admission showed normal sinus rhythm, with occasional PVCs, old anterior infarct, 92 bpm; QTc 456.       3.   EKG #2 showed normal sinus rhythm, 74 bpm, with PR 203, QRS 104, QTc 419, with old anterior infarct.  No acute changes.  4.   Telemetry strips available for review, as well as on live telemetry while patient was down in ER, reveal what appears to be monomorphic ventricular tachycardia, with rates 180-210.         PHYSICAL EXAMINATION:  Vital signs:  Temperature 97.8, pulse 71, respirations 16, blood pressure 140/80, pulse ox 95% on 2 L, was 97% on room air on admission.  General:  This is an obese, pleasant, African American  male, in no acute distress, who is lying approximately flat in bed.  Cardiovascular:  Auscultation of the heart reveals regular rate and rhythm, with no murmurs, rubs, or gallops appreciated.  HEENT:  Normocephalic, atraumatic.  Extraocular muscles intact.  Clear sclerae.  Nares are without discharge.  Neck:  Supple, without carotid bruit.  JVD is difficult to assess, given his neck habitus.   Respiratory:  Lungs are clear to auscultation bilaterally, without wheezes,  rales, or rhonchi.  Abdomen:  Soft, nontender, nondistended, with positive bowel sounds.  Extremities:  Warm, dry, and without edema.  Neurologic:  He is alert and oriented x3, responds to questions appropriately with a normal affect.         ASSESSMENT AND PLAN:       1.   Sustained paroxysmal ventricular tachycardia, currently quiescent on lidocaine drip.       2.   Chest discomfort / "heartburn".       3.   History of nonischemic cardiomyopathy.       4.   Mild nonobstructive coronary artery disease by catheter 11/2010.       5.   Recent sinus infection, being treated with ciprofloxacin.       6.   Chronic kidney disease, appears stable.       7.   Obstructive sleep apnea, with history of noncompliance with continuous positive airway pressure, with resultant pulmonary hypertension.       8.   Diabetes mellitus.       9.   Hypertension, apparently controlled.       10.  Morbid obesity.               The patient was seen and examined by Dr. Antoine Poche and myself.  [name] is a 71 year old gentleman who presents with complaints of dizziness and chest discomfort today, in the setting of paroxysmal ventricular tachycardia.  His electrolytes are within normal limits.  First cardiac enzymes are negative x2. The precipitating factor for his V-tach is not clear.  Of note, he was recently started on ciprofloxacin, and with a history of heart failure, we will discontinue this.  We will check TSH and continue to cycle cardiac enzymes. Otherwise  would continue home medications, including his beta blocker of Coreg 25 mg b.i.d.  We will check 2D echocardiogram and follow on telemetry.  Would continue lidocaine drip at present dose for now.  We will reassess in the morning, based on his echocardiogram results.  He may need a repeat ischemic workup, given his cardiac risk factors, plus or minus electrophysiology evaluation.  We will also given him 324 mg of aspirin now and continue his p.o. home aspirin daily, starting tomorrow. We will continue to follow with you.  ______________________________                                                   Laurann Montana, PA-C                                                                                                                  ______________________________       MD: Sandrea Hammond, MD       D:  10/21/11 1622       T:  10/21/11 1653       P:  [id number]             UI: [id number]       DT: [id number]       copy for: Medical Records                      cc:

## 2011-10-24 ENCOUNTER — Encounter (HOSPITAL_COMMUNITY)
Admission: AD | Disposition: A | Discharge status: Skilled Nursing Facility | Payer: Self-pay | Source: Other Acute Inpatient Hospital | Attending: Cardiology

## 2011-10-24 DIAGNOSIS — I472 Ventricular tachycardia: Secondary | ICD-10-CM

## 2011-10-24 HISTORY — PX: LEFT HEART CATHETERIZATION WITH CORONARY ANGIOGRAM: SHX5451

## 2011-10-24 LAB — BASIC METABOLIC PANEL
BUN: 22 mg/dL (ref 6–23)
CO2: 25 mEq/L (ref 19–32)
Calcium: 9.3 mg/dL (ref 8.4–10.5)
Creatinine, Ser: 1.16 mg/dL (ref 0.50–1.35)

## 2011-10-24 LAB — GLUCOSE, CAPILLARY
Glucose-Capillary: 92 mg/dL (ref 70–99)
Glucose-Capillary: 111 mg/dL — ABNORMAL HIGH (ref 70–99)
Glucose-Capillary: 151 mg/dL — ABNORMAL HIGH (ref 70–99)

## 2011-10-24 LAB — CBC
MCH: 30.9 pg (ref 26.0–34.0)
MCHC: 32.5 g/dL (ref 30.0–36.0)
MCV: 95.1 fL (ref 78.0–100.0)
Platelets: 171 10*3/uL (ref 150–400)
RDW: 13.4 % (ref 11.5–15.5)
WBC: 5.8 10*3/uL (ref 4.0–10.5)

## 2011-10-24 SURGERY — LEFT HEART CATHETERIZATION WITH CORONARY ANGIOGRAM
Anesthesia: LOCAL

## 2011-10-24 MED ORDER — ASPIRIN 81 MG PO TBEC: 81.0000 mg | DELAYED_RELEASE_TABLET | Freq: Every day | ORAL | Status: DC

## 2011-10-24 MED ORDER — LISINOPRIL 40 MG PO TABS
40.0000 mg | ORAL_TABLET | Freq: Every day | ORAL | Status: DC
Start: 2011-10-24 — End: 2011-10-24

## 2011-10-24 MED ORDER — ACETAMINOPHEN 325 MG PO TABS: 650.0000 mg | ORAL_TABLET | ORAL | Status: DC | PRN

## 2011-10-24 MED ORDER — HYDROCODONE-ACETAMINOPHEN 5-325 MG PO TABS
1.0000 | ORAL_TABLET | Freq: Four times a day (QID) | ORAL | Status: DC | PRN
  Administered 2011-10-25 – 2011-10-27 (×5): 1 via ORAL
  Filled 2011-10-24 (×5): qty 1

## 2011-10-24 MED ORDER — FLUTICASONE PROPIONATE 50 MCG/ACT NA SUSP
2.0000 | Freq: Every day | NASAL | Status: DC | PRN
  Administered 2011-10-26 – 2011-10-28 (×3): 2 via NASAL
  Filled 2011-10-24: qty 16

## 2011-10-24 MED ORDER — AMIODARONE HCL IN DEXTROSE 360-4.14 MG/200ML-% IV SOLN
60.0000 mg/h | INTRAVENOUS | Status: DC
  Administered 2011-10-24 – 2011-10-25 (×2): 30 mg/h via INTRAVENOUS
  Filled 2011-10-24 (×5): qty 200

## 2011-10-24 MED ORDER — DULOXETINE HCL 30 MG PO CPEP: 30.0000 mg | ORAL_CAPSULE | Freq: Every day | ORAL | Status: DC

## 2011-10-24 MED ORDER — ONDANSETRON HCL 4 MG/2ML IJ SOLN: 4.0000 mg | Freq: Four times a day (QID) | INTRAMUSCULAR | Status: DC | PRN

## 2011-10-24 MED ORDER — HEPARIN (PORCINE) IN NACL 2-0.9 UNIT/ML-% IJ SOLN
INTRAMUSCULAR | Status: AC
  Filled 2011-10-24: qty 1500

## 2011-10-24 MED ORDER — LIDOCAINE HCL (PF) 1 % IJ SOLN
INTRAMUSCULAR | Status: AC
  Filled 2011-10-24: qty 30

## 2011-10-24 MED ORDER — LORATADINE 10 MG PO TABS
10.0000 mg | ORAL_TABLET | Freq: Every day | ORAL | Status: DC
  Filled 2011-10-24: qty 1

## 2011-10-24 MED ORDER — SODIUM CHLORIDE 0.9 % IV SOLN: 250.0000 mL | INTRAVENOUS | Status: DC | PRN

## 2011-10-24 MED ORDER — MIDAZOLAM HCL 2 MG/2ML IJ SOLN
INTRAMUSCULAR | Status: AC
  Filled 2011-10-24: qty 2

## 2011-10-24 MED ORDER — SODIUM CHLORIDE 0.9 % IV SOLN
INTRAVENOUS | Status: DC
  Administered 2011-10-24: 15:00:00 via INTRAVENOUS

## 2011-10-24 MED ORDER — SODIUM CHLORIDE 0.9 % IV SOLN: INTRAVENOUS | Status: DC

## 2011-10-24 MED ORDER — SODIUM CHLORIDE 0.9 % IJ SOLN: 3.0000 mL | Freq: Two times a day (BID) | INTRAMUSCULAR | Status: DC

## 2011-10-24 MED ORDER — AMIODARONE HCL 200 MG PO TABS
400.0000 mg | ORAL_TABLET | Freq: Two times a day (BID) | ORAL | Status: DC
  Administered 2011-10-24 – 2011-10-29 (×11): 400 mg via ORAL
  Filled 2011-10-24 (×13): qty 2

## 2011-10-24 MED ORDER — AMLODIPINE BESYLATE 10 MG PO TABS
10.0000 mg | ORAL_TABLET | Freq: Every day | ORAL | Status: DC
  Filled 2011-10-24: qty 1

## 2011-10-24 MED ORDER — CIPROFLOXACIN HCL 500 MG PO TABS
500.0000 mg | ORAL_TABLET | Freq: Two times a day (BID) | ORAL | Status: DC
  Administered 2011-10-24 – 2011-10-29 (×10): 500 mg via ORAL
  Filled 2011-10-24 (×11): qty 1

## 2011-10-24 MED ORDER — FUROSEMIDE 40 MG PO TABS: 40.0000 mg | ORAL_TABLET | ORAL | Status: DC

## 2011-10-24 MED ORDER — COLCHICINE 0.6 MG PO TABS
0.6000 mg | ORAL_TABLET | Freq: Every day | ORAL | Status: DC | PRN
  Administered 2011-10-25 – 2011-10-26 (×2): 0.6 mg via ORAL
  Filled 2011-10-24 (×3): qty 1

## 2011-10-24 MED ORDER — SODIUM CHLORIDE 0.9 % IJ SOLN: 3.0000 mL | INTRAMUSCULAR | Status: DC | PRN

## 2011-10-24 MED ORDER — DUTASTERIDE 0.5 MG PO CAPS
0.5000 mg | ORAL_CAPSULE | Freq: Every day | ORAL | Status: DC
  Administered 2011-10-25 – 2011-10-29 (×5): 0.5 mg via ORAL
  Filled 2011-10-24 (×5): qty 1

## 2011-10-24 MED ORDER — FLUTICASONE PROPIONATE 50 MCG/ACT NA SUSP
1.0000 | Freq: Every day | NASAL | Status: DC
  Administered 2011-10-24 – 2011-10-25 (×2): 1 via NASAL
  Filled 2011-10-24: qty 16

## 2011-10-24 MED ORDER — SIMVASTATIN 20 MG PO TABS: 20.0000 mg | ORAL_TABLET | Freq: Every day | ORAL | Status: DC

## 2011-10-24 MED ORDER — CARVEDILOL 25 MG PO TABS
25.0000 mg | ORAL_TABLET | Freq: Two times a day (BID) | ORAL | Status: DC
  Filled 2011-10-24 (×2): qty 1

## 2011-10-24 MED ORDER — FENTANYL CITRATE 0.05 MG/ML IJ SOLN
INTRAMUSCULAR | Status: AC
Start: 2011-10-24 — End: 2011-10-24
  Filled 2011-10-24: qty 2

## 2011-10-24 MED ORDER — NITROGLYCERIN 0.2 MG/ML ON CALL CATH LAB
INTRAVENOUS | Status: AC
  Filled 2011-10-24: qty 1

## 2011-10-24 MED FILL — Amiodarone HCl Inj 150 MG/3ML (50 MG/ML): INTRAVENOUS | Qty: 3 | Status: AC

## 2011-10-24 NOTE — H&P (View-Only) (Signed)
Patient ID: [name], male   DOB: 08/12/1940, 71 y.o.   MRN: [id number] Mr. Lenker developed runs of wide-complex tachycardia in route via [facility]. Hemodynamics were stable with a blood pressure 1:30. Heart rate is running about 150 to 160s. 12-lead was obtained which looked like possible A. fib with rapid ventricular rate conduction delay. However after further review it may all be VT since his axis does shift.  He was given 150 mg IV amiodarone in route per my request. He has now been put on a 60 mg per hour drip with her amiodarone protocol. Potassium was above for this morning. He is awaiting EP consultation.  Total time spent in care was greater than or equal to one half hour. We will also send off a magnesium level. He is currently stable and being monitored here in the CCU. 

## 2011-10-24 NOTE — CV Procedure (Signed)
    Cardiac Cath Note  Ariv Gotte [id number] 02/07/1940  Procedure: Left  Heart Cardiac Catheterization Note Indications: VT  Procedure Details Consent: Obtained Time Out: Verified patient identification, verified procedure, site/side was marked, verified correct patient position, special equipment/implants available, Radiology Safety Procedures followed,  medications/allergies/relevent history reviewed, required imaging and test results available.  Performed   Medications: Fentanyl: 50 mcg IV Versed: 2 mg IV  The right femoral artery was easily canulated using a modified Seldinger technique.  Hemodynamics:   LV pressure: 101/15 Aortic pressure: 101/49  Angiography   Left Main:  The Left main is smooth and normal.    Left anterior Descending:  The LAD is smooth, large and fairly normal.  Left Circumflex:  The LCx is a large system.  The 1st OM is large, smooth and normal  Right Coronary Artery:  The RCA is large, smooth and normal.  LV Gram:  The LV is moderately dilated.  There is diffuse mild hypokinesis.  The EF is 40-45%.   Complications: No apparent complications Patient did tolerate procedure well.  Contrast used: 70 cc  Conclusions:   1. Normal coronaries 2. Hx of CT 3. Mild -moderate LV dysfunction  Vesta Mixer, Montez Hageman., MD, [facility] 10/24/2011, 6:35 PM Office - [phone] Pager [phone]

## 2011-10-24 NOTE — Interval H&P Note (Signed)
History and Physical Interval Note:  10/24/2011 12:59 PM  [name]  has presented today for surgery, with the diagnosis of Chest pain  The various methods of treatment have been discussed with the patient and family. After consideration of risks, benefits and other options for treatment, the patient has consented to  Procedure(s) (LRB) with comments: LEFT HEART CATHETERIZATION WITH CORONARY ANGIOGRAM (N/A) as a surgical intervention .  The patient's history has been reviewed, patient examined, no change in status, stable for surgery.  I have reviewed the patient's chart and labs.  Questions were answered to the patient's satisfaction.    Pt has had significant VT.  Agree with plans for cath today.   Elyn Aquas.

## 2011-10-24 NOTE — Consult Note (Signed)
ELECTROPHYSIOLOGY CONSULT NOTE  Patient ID: Artavius Stearns MRN: [id number], DOB/AGE: [birth year]   Admit date: 10/23/2011 Date of Consult: 10/24/2011  Primary Physician: Kirstie Peri, MD Primary Cardiologist: Kirke Corin, MD Reason for Consultation: WCT  History of Present Illness Mr. Fissel is a 71 year old gentleman with a nonischemic cardiomyopathy, nonobstructive coronary artery disease by cath in November 2012, diabetes mellitus, obstructive sleep apnea with pulmonary hypertension, chronic kidney disease, and prior DVT after a Port-A-Cath implantation for UTI treatment.  He has LV dysfunction diagnosed in 2010, with EF of 35% to 40%. In November 2012, his EF was 40%. He underwent subsequent catheterization, on 12/13/2010, demonstrating nonobstructive coronary artery disease, with 20% to 30% LAD. He presented to [facility] with complaints of "heartburn" and dizziness. On 10/21/2011, he had 2 episodes of chest discomfort radiating up into his throat that he describes as indigestion, lasting several minutes, associated with dizziness. He reports a similar episode approximately one week ago. He denies palpitations or syncope. He has chronic SOB and denies worsening SOB. In the [facility] ED, he was found to have frequent ventricular ectopy and 1-2 minute runs of monomorphic VT. He was apparently asymptomatic during these episodes and he denied any recurrent CP or dizziness while in the ED. He was bolused with lidocaine followed by continuous infusion. He did not require emergent defibrillation. His CEs were negative. Since that time, his ectopy improved and he maintained sinus rhythm in the 70s. He remained hemodynamically stable. The lidocaine infusion was DC'd 10/22/2011. Yesterday morning he developed recurrent, sustained VT and he was transferred to [facility] for further EP evaluation and treatment.   Past Medical History Past Medical History  Diagnosis Date  . Nonischemic dilated  cardiomyopathy   . Mild renal insufficiency   . Hyperkalemia   . Hypertension   . Obstructive sleep apnea   . Dyslipidemia   . Diabetes mellitus   . Hyperlipidemia   . DVT of upper extremity (deep vein thrombosis) 2012    right arm  . Sinus drainage   . Blood transfusion   . Anemia   . Chronic kidney disease   . Headache     "stopped when I was a teen"  . Depression   . Arthritis   . Gout     right foot  . Excessive flatus     Past Surgical History Past Surgical History  Procedure Date  . Bunionectomy     right foot  . Carpal tunnel release     right hand  . Joint replacement   . Total hip arthroplasty     left  . Total knee arthroplasty     right  . Appendectomy 1961  . Tonsillectomy and adenoidectomy ~ 1950  . Cataract extraction w/ intraocular lens  implant, bilateral      Allergies/Intolerances Allergies  Allergen Reactions  . Codeine Other (See Comments)    Bones ache  . Milk-Related Compounds   . Warfarin Sodium     REACTION: possible Coumadin induced necrosis    Inpatient Medications . amiodarone  150 mg Intravenous Once  . amLODipine  10 mg Oral Daily  . aspirin EC  81 mg Oral Daily  . atorvastatin  10 mg Oral q1800  . carvedilol  25 mg Oral BID WC  . DULoxetine  30 mg Oral Daily  . finasteride  5 mg Oral QHS  . furosemide  40 mg Oral Daily  . heparin  5,000 Units Subcutaneous Q8H  . influenza  inactive virus vaccine  0.5 mL Intramuscular Tomorrow-1000  . lisinopril  40 mg Oral Daily  . loratadine  10 mg Oral Daily  . sodium chloride  3 mL Intravenous Q12H   . amiodarone (NEXTERONE PREMIX) 360 mg/200 mL dextrose 30 mg/hr (10/24/11 0758)   Family History Positive for CAD, none prematurely. No history of SCD.   Social History Social History  . Marital Status: Single   Social History Main Topics  . Smoking status: Former Smoker -- 1.0 packs/day for 15 years    Types: Cigarettes    Quit date: 01/20/1982  . Smokeless tobacco: Not on file    Comment: stopped smoking cigarettes 1984  . Alcohol Use: Yes     "stopped drinking alcohol 1984"  . Drug Use: No   Review of Systems General: No chills, fever, night sweats or weight changes  Cardiovascular: No dyspnea on exertion, edema, orthopnea, palpitations, paroxysmal nocturnal dyspnea Dermatological: No rash, lesions or masses Respiratory: No cough, dyspnea Urologic: No hematuria, dysuria Abdominal:   No nausea, vomiting, diarrhea, bright red blood per rectum, melena, or hematemesis Neurologic:  No visual changes, weakness, changes in mental status All other systems reviewed and are otherwise negative except as noted above.  Physical Exam Blood pressure 116/61, pulse 65, temperature 97.9 F (36.6 C), temperature source Oral, resp. rate 18, height 6' (1.829 m), weight 325 lb 6.4 oz (147.6 kg), SpO2 100.00%.  General: Well developed, obese 71 year old male in no acute distress. HEENT: Normocephalic, atraumatic. EOMs intact. Sclera nonicteric. Oropharynx clear.  Neck: Supple without bruits. Difficult to assess JVD due to body habitus but does not appear elevated. Lungs: Distant breath sounds. Respirations regular and unlabored, CTA bilaterally. No wheezes, rales or rhonchi. Heart: Distant heart tones. Occasionally irregular. S1, S2 present. No murmur, rub, S3 or S4. Abdomen: Large but soft, non-tender, non-distended. BS present x 4 quadrants. No hepatosplenomegaly.  Extremities: No clubbing, cyanosis or edema. DP/PT/Radials 2+ and equal bilaterally. Psych: Normal affect. Neuro: Alert and oriented X 3. Moves all extremities spontaneously. Musculoskeletal: No kyphosis. Skin: Intact. Warm and dry. No rashes or petechiae in exposed areas.   Labs Lab Results  Component Value Date   WBC 5.8 10/24/2011   HGB 13.3 10/24/2011   HCT 40.9 10/24/2011   MCV 95.1 10/24/2011   PLT 171 10/24/2011    Lab 10/24/11 0430  NA 138  K 4.4  CL 104  CO2 25  BUN 22  CREATININE 1.16  CALCIUM 9.3   PROT --  BILITOT --  ALKPHOS --  ALT --  AST --  GLUCOSE 117*   Mg level at [facility] on 10/22/2011 - 2.0 Cardiac enzymes x3 at [facility]  Radiology/Studies No results found.  Echocardiogram  2D Echo from 10/22/11  Findings: Mild concentric LVH. Global hypokinesis of LV with minor regional variation. EF 35-40%. Abnormal LV diastolic filling is observed, consistent with impaired relaxation. The LV diastolic filling pattern is consistent with elevated left atrial pressure. LA mildly dilated. MV leaflets mildly thickened. Mild MR. Trace TR. Unable to estimate RVSP. No pericardial effusion.  12-[facility] from [facility] 10/22/2011 shows sinus rhythm at 66 bpm with anterior Q waves; PR 207 ms, QRS 106 ms, QT/QTc 417/437 ms Telemetry strips reviewed from [facility] show sinus rhythm with frequent ventricular ectopy and several episodes of WCT, most consistent with monomorphic VT  Assessment and Plan 1. Sustained VT 2. NICM - EF 35-40%, global HK; overall unchanged from previous echo in 2010 and in 2012 3. Nonobstructive CAD s/p  cath November 2012 4. HTN 5. DM 6. OSA, noncompliant with CPAP 7. Morbid obesity 8. CKD Mr. Kinker has had recurrent, sustained VT. He had nonobstructive CAD ~1 year ago, by cath in November 2012 (after false positive stress test), now with recurrent monomorphic VT, question need for repeat cardiac catheterization to definitively rule out CAD progression/ischemia in this high risk patient (HTN, HLD, DM, obesity and prior tobacco abuse). His rhythm remains stable this AM on IV amiodarone and will continue loading today with transition to PO amiodarone in the next 24-48 hours, if remains stable. Dr. Johney Frame to discuss his recommendations regarding possible ICD.    Dr. Johney Frame to see and make further recommendations. Signed, Rick Duff, PA-C 10/24/2011, 8:01 AM  I have seen, examined the patient, and reviewed the above assessment and plan.  Changes  to above are made where necessary.  Pt admitted with sustained symptomatic sustained VT with CL .  This was a RBB L inferior axis VT.  He has a known nonischemic cardiomyopathy with depressed EF despite optimal medical therapy.  At this time, I think that we should exclude ischemia with cath.  If no acute lesions, then I would anticipate that he would proceed with ICD implantation on Monday. Risks, benefits, and alternatives to cath were discussed at length today.  He is willing to proceed. We will continue IV amiodarone and add PO amiodarone today. If no further VT, could transfer to stepdown post cath and then likely stop IV amiodarone in AM.  Co Sign: Hillis Range, MD 10/24/2011 11:18 AM

## 2011-10-25 DIAGNOSIS — I5022 Chronic systolic (congestive) heart failure: Secondary | ICD-10-CM

## 2011-10-25 LAB — GLUCOSE, CAPILLARY
Glucose-Capillary: 140 mg/dL — ABNORMAL HIGH (ref 70–99)
Glucose-Capillary: 184 mg/dL — ABNORMAL HIGH (ref 70–99)

## 2011-10-25 NOTE — Progress Notes (Signed)
Subjective:   71 y/o male with h/o obesity, DM2 and NICM admitted with VT. Cath yesterday with normal cors. EF 40-45%. On amio. Pending ICD implant on Monday.   Doing well. Has some pain in R foot thinks it might be gout. No CP, palpitations, SOB. On IV amio.   2 brief (5 beat) runs of NSVT overnight.    Intake/Output Summary (Last 24 hours) at 10/25/11 0901 Last data filed at 10/25/11 0700  Gross per 24 hour  Intake 1495.7 ml  Output   1725 ml  Net -229.3 ml    Current meds:    . amiodarone  400 mg Oral BID  . amLODipine  10 mg Oral Daily  . aspirin EC  81 mg Oral Daily  . atorvastatin  10 mg Oral q1800  . carvedilol  25 mg Oral BID WC  . ciprofloxacin  500 mg Oral BID  . DULoxetine  30 mg Oral Daily  . dutasteride  0.5 mg Oral Daily  . fentaNYL      . finasteride  5 mg Oral QHS  . fluticasone  1 spray Each Nare Daily  . furosemide  40 mg Oral Daily  . heparin      . heparin  5,000 Units Subcutaneous Q8H  . influenza  inactive virus vaccine  0.5 mL Intramuscular Tomorrow-1000  . lidocaine      . lisinopril  40 mg Oral Daily  . loratadine  10 mg Oral Daily  . midazolam      . nitroGLYCERIN      . sodium chloride  3 mL Intravenous Q12H  . DISCONTD: amLODipine  10 mg Oral Daily  . DISCONTD: aspirin  81 mg Oral Daily  . DISCONTD: carvedilol  25 mg Oral BID WC  . DISCONTD: DULoxetine  30 mg Oral Daily  . DISCONTD: furosemide  40 mg Oral BH-q7a  . DISCONTD: lisinopril  40 mg Oral Daily  . DISCONTD: loratadine  10 mg Oral Daily  . DISCONTD: simvastatin  20 mg Oral q1800  . DISCONTD: sodium chloride  3 mL Intravenous Q12H   Infusions:    . sodium chloride 100 mL/hr at 10/24/11 1515  . amiodarone (NEXTERONE PREMIX) 360 mg/200 mL dextrose 30 mg/hr (10/25/11 0213)  . DISCONTD: sodium chloride    . DISCONTD: amiodarone (NEXTERONE PREMIX) 360 mg/200 mL dextrose 30 mg/hr (10/24/11 0758)     Objective:  Blood pressure 128/65, pulse 70, temperature 98.1 F (36.7  C), temperature source Oral, resp. rate 21, height 6' (1.829 m), weight 149.1 kg (328 lb 11.3 oz), SpO2 93.00%. Weight change: 2.2 kg (4 lb 13.6 oz)  Physical Exam: General:  Obese. Well appearing. No resp difficulty HEENT: normal Neck: supple. JVP unable to visualize . Carotids 2+ bilat; no bruits. No lymphadenopathy or thryomegaly appreciated. Cor: PMI nondisplaced. Distant Regular rate & rhythm. No rubs, gallops or murmurs. Lungs: clear Abdomen: obese soft, nontender, nondistended. No hepatosplenomegaly. No bruits or masses. Good bowel sounds. Extremities: no cyanosis, clubbing, rash, edema Neuro: alert & orientedx3, cranial nerves grossly intact. moves all 4 extremities w/o difficulty. Affect pleasant  Telemetry: Sinus rhythm. 2 brief runs NSVT.   Lab Results: Basic Metabolic Panel:  Lab 10/24/11 9147 10/23/11 1707  NA 138 --  K 4.4 --  CL 104 --  CO2 25 --  GLUCOSE 117* --  BUN 22 --  CREATININE 1.16 1.06  CALCIUM 9.3 --  MG -- --  PHOS -- --   Liver Function Tests: No results  found for this basename: AST:5,ALT:5,ALKPHOS:5,BILITOT:5,PROT:5,ALBUMIN:5 in the last 168 hours No results found for this basename: LIPASE:5,AMYLASE:5 in the last 168 hours No results found for this basename: AMMONIA:5 in the last 168 hours CBC:  Lab 10/24/11 0430 10/23/11 1707  WBC 5.8 7.5  NEUTROABS -- --  HGB 13.3 12.8*  HCT 40.9 39.2  MCV 95.1 94.7  PLT 171 161   Cardiac Enzymes: No results found for this basename: CKTOTAL:5,CKMB:5,CKMBINDEX:5,TROPONINI:5 in the last 168 hours BNP: No components found with this basename: POCBNP:5 CBG:  Lab 10/25/11 0740 10/24/11 2115 10/24/11 1653 10/24/11 1343 10/24/11 1215  GLUCAP 101* 151* 111* 92 96   Microbiology: No results found for this basename: cult   No results found for this basename: CULT:2,SDES:2 in the last 168 hours  Imaging: No results found.   ASSESSMENT:  1. VT 2. NICM EF 40% 3. Normal cors on cath 10/4 4. Morbid  obesity 5. OSA 6. DM2 7. Foot pain - ? Gout 8. UTI - on cipro  PLAN/DISCUSSION:  Doing well. VT essentially quiescent on amio. Will transfer to stepdown. No active HF. On good meds. ICD on Monday. Will check uric acid. Need to mobilize.   LOS: 2 days    Arvilla Meres, MD 10/25/2011, 9:01 AM

## 2011-10-26 DIAGNOSIS — M109 Gout, unspecified: Secondary | ICD-10-CM

## 2011-10-26 LAB — GLUCOSE, CAPILLARY: Glucose-Capillary: 106 mg/dL — ABNORMAL HIGH (ref 70–99)

## 2011-10-26 MED ORDER — CHLORHEXIDINE GLUCONATE 4 % EX LIQD
60.0000 mL | Freq: Once | CUTANEOUS | Status: AC
  Administered 2011-10-27: 4 via TOPICAL
  Filled 2011-10-26: qty 60

## 2011-10-26 MED ORDER — ALLOPURINOL 100 MG PO TABS
200.0000 mg | ORAL_TABLET | Freq: Every day | ORAL | Status: DC
  Administered 2011-10-26 – 2011-10-29 (×4): 200 mg via ORAL
  Filled 2011-10-26 (×4): qty 2

## 2011-10-26 MED ORDER — DEXTROSE 5 % IV SOLN
3.0000 g | INTRAVENOUS | Status: AC
  Administered 2011-10-27: 3 g via INTRAVENOUS
  Filled 2011-10-26: qty 3000

## 2011-10-26 MED ORDER — SODIUM CHLORIDE 0.9 % IR SOLN
80.0000 mg | Status: DC
  Filled 2011-10-26: qty 2

## 2011-10-26 MED ORDER — CHLORHEXIDINE GLUCONATE 4 % EX LIQD
60.0000 mL | Freq: Once | CUTANEOUS | Status: AC
  Administered 2011-10-26: 4 via TOPICAL
  Filled 2011-10-26: qty 60

## 2011-10-26 MED ORDER — SODIUM CHLORIDE 0.9 % IV SOLN
200.0000 mg | Freq: Every day | INTRAVENOUS | Status: DC
  Filled 2011-10-26: qty 200

## 2011-10-26 NOTE — Progress Notes (Signed)
Subjective:   71 y/o male with h/o obesity, DM2 and NICM admitted with VT. Cath with normal cors. EF 40-45%. On amio. Pending ICD implant on Monday.   Doing well. Has some pain in R foot thinks it might be gout. Uric acid up at 9.9. Being treated with colchicine and pain meds.  No CP, palpitations, SOB. IV amio switched to po.   On tele has frequent PVCs but no further VT.  Intake/Output Summary (Last 24 hours) at 10/26/11 1040 Last data filed at 10/26/11 0500  Gross per 24 hour  Intake    360 ml  Output    850 ml  Net   -490 ml    Current meds:    . amiodarone  400 mg Oral BID  . amLODipine  10 mg Oral Daily  . aspirin EC  81 mg Oral Daily  . atorvastatin  10 mg Oral q1800  . carvedilol  25 mg Oral BID WC  . ciprofloxacin  500 mg Oral BID  . DULoxetine  30 mg Oral Daily  . dutasteride  0.5 mg Oral Daily  . finasteride  5 mg Oral QHS  . furosemide  40 mg Oral Daily  . heparin  5,000 Units Subcutaneous Q8H  . lisinopril  40 mg Oral Daily  . loratadine  10 mg Oral Daily  . sodium chloride  3 mL Intravenous Q12H  . DISCONTD: fluticasone  1 spray Each Nare Daily   Infusions:    . DISCONTD: sodium chloride 100 mL/hr at 10/24/11 1515     Objective:  Blood pressure 103/48, pulse 77, temperature 98 F (36.7 C), temperature source Oral, resp. rate 20, height 6' (1.829 m), weight 150.3 kg (331 lb 5.6 oz), SpO2 95.00%. Weight change: 1.2 kg (2 lb 10.3 oz)  Physical Exam: General:  Obese. Well appearing. No resp difficulty HEENT: normal Neck: supple. JVP unable to visualize . Carotids 2+ bilat; no bruits. No lymphadenopathy or thryomegaly appreciated. Cor: PMI nondisplaced. Distant Regular rate & rhythm. No rubs, gallops or murmurs. Lungs: clear Abdomen: obese soft, nontender, nondistended. No hepatosplenomegaly. No bruits or masses. Good bowel sounds. Extremities: no cyanosis, clubbing, rash, edema Neuro: alert & orientedx3, cranial nerves grossly intact. moves all 4  extremities w/o difficulty. Affect pleasant  Telemetry: Sinus rhythm. 2 brief runs NSVT.   Lab Results: Basic Metabolic Panel:  Lab 10/24/11 1191 10/23/11 1707  NA 138 --  K 4.4 --  CL 104 --  CO2 25 --  GLUCOSE 117* --  BUN 22 --  CREATININE 1.16 1.06  CALCIUM 9.3 --  MG -- --  PHOS -- --   Liver Function Tests: No results found for this basename: AST:5,ALT:5,ALKPHOS:5,BILITOT:5,PROT:5,ALBUMIN:5 in the last 168 hours No results found for this basename: LIPASE:5,AMYLASE:5 in the last 168 hours No results found for this basename: AMMONIA:5 in the last 168 hours CBC:  Lab 10/24/11 0430 10/23/11 1707  WBC 5.8 7.5  NEUTROABS -- --  HGB 13.3 12.8*  HCT 40.9 39.2  MCV 95.1 94.7  PLT 171 161   Cardiac Enzymes: No results found for this basename: CKTOTAL:5,CKMB:5,CKMBINDEX:5,TROPONINI:5 in the last 168 hours BNP: No components found with this basename: POCBNP:5 CBG:  Lab 10/26/11 0813 10/25/11 2117 10/25/11 1715 10/25/11 1217 10/25/11 0740  GLUCAP 106* 184* 140* 98 101*   Microbiology: No results found for this basename: cult   No results found for this basename: CULT:2,SDES:2 in the last 168 hours  Imaging: No results found.   ASSESSMENT:  1.  VT 2. NICM EF 40% 3. Normal cors on cath 10/4 4. Morbid obesity 5. OSA 6. DM2 7. Foot pain - ? Gout 8. UTI - on cipro  PLAN/DISCUSSION:  Doing well. VT  quiescent on amio. No active HF. On good meds. ICD tomorrow. Will make NPO.   Uric acid is up. Add allopurinol. PT consult to mobilize.   LOS: 3 days    Arvilla Meres, MD 10/26/2011, 10:40 AM

## 2011-10-27 ENCOUNTER — Encounter (HOSPITAL_COMMUNITY)
Admission: AD | Disposition: A | Discharge status: Skilled Nursing Facility | Payer: Self-pay | Source: Other Acute Inpatient Hospital | Attending: Cardiology

## 2011-10-27 ENCOUNTER — Encounter: Payer: Self-pay | Admitting: *Deleted

## 2011-10-27 DIAGNOSIS — I472 Ventricular tachycardia: Secondary | ICD-10-CM

## 2011-10-27 DIAGNOSIS — Z95810 Presence of automatic (implantable) cardiac defibrillator: Secondary | ICD-10-CM | POA: Insufficient documentation

## 2011-10-27 HISTORY — PX: CARDIAC DEFIBRILLATOR PLACEMENT: SHX171

## 2011-10-27 HISTORY — PX: IMPLANTABLE CARDIOVERTER DEFIBRILLATOR IMPLANT: SHX5473

## 2011-10-27 LAB — CBC
Hemoglobin: 11.8 g/dL — ABNORMAL LOW (ref 13.0–17.0)
MCH: 30.6 pg (ref 26.0–34.0)
MCHC: 32.5 g/dL (ref 30.0–36.0)
MCV: 94 fL (ref 78.0–100.0)

## 2011-10-27 LAB — BASIC METABOLIC PANEL
BUN: 28 mg/dL — ABNORMAL HIGH (ref 6–23)
CO2: 26 mEq/L (ref 19–32)
Calcium: 9.1 mg/dL (ref 8.4–10.5)
GFR calc non Af Amer: 47 mL/min — ABNORMAL LOW (ref >90)
Glucose, Bld: 104 mg/dL — ABNORMAL HIGH (ref 70–99)
Potassium: 4.3 mEq/L (ref 3.5–5.1)

## 2011-10-27 LAB — SURGICAL PCR SCREEN
MRSA, PCR: NEGATIVE
Staphylococcus aureus: NEGATIVE

## 2011-10-27 LAB — MAGNESIUM: Magnesium: 2.3 mg/dL (ref 1.5–2.5)

## 2011-10-27 SURGERY — IMPLANTABLE CARDIOVERTER DEFIBRILLATOR IMPLANT
Anesthesia: LOCAL

## 2011-10-27 MED ORDER — LIDOCAINE HCL (PF) 1 % IJ SOLN
INTRAMUSCULAR | Status: AC
  Filled 2011-10-27: qty 30

## 2011-10-27 MED ORDER — MIDAZOLAM HCL 5 MG/5ML IJ SOLN
INTRAMUSCULAR | Status: AC
  Filled 2011-10-27: qty 5

## 2011-10-27 MED ORDER — SODIUM CHLORIDE 0.9 % IV SOLN
INTRAVENOUS | Status: AC
  Administered 2011-10-27: 12:00:00 via INTRAVENOUS

## 2011-10-27 MED ORDER — SODIUM CHLORIDE 0.45 % IV SOLN
INTRAVENOUS | Status: DC
  Administered 2011-10-27: 08:00:00 via INTRAVENOUS

## 2011-10-27 MED ORDER — SODIUM CHLORIDE 0.9 % IV SOLN: 250.0000 mL | INTRAVENOUS | Status: DC | PRN

## 2011-10-27 MED ORDER — FENTANYL CITRATE 0.05 MG/ML IJ SOLN
INTRAMUSCULAR | Status: AC
  Filled 2011-10-27: qty 2

## 2011-10-27 MED ORDER — YOU HAVE A PACEMAKER BOOK
Freq: Once | Status: AC
  Administered 2011-10-27: 23:00:00
  Filled 2011-10-27: qty 1

## 2011-10-27 MED ORDER — HEPARIN (PORCINE) IN NACL 2-0.9 UNIT/ML-% IJ SOLN
INTRAMUSCULAR | Status: AC
  Filled 2011-10-27: qty 500

## 2011-10-27 MED ORDER — SODIUM CHLORIDE 0.9 % IV SOLN: INTRAVENOUS | Status: DC

## 2011-10-27 MED ORDER — CEFAZOLIN SODIUM 1-5 GM-% IV SOLN
1.0000 g | Freq: Four times a day (QID) | INTRAVENOUS | Status: AC
  Administered 2011-10-27 – 2011-10-28 (×3): 1 g via INTRAVENOUS
  Filled 2011-10-27 (×3): qty 50

## 2011-10-27 MED ORDER — SODIUM CHLORIDE 0.45 % IV SOLN: INTRAVENOUS | Status: DC

## 2011-10-27 MED ORDER — ONDANSETRON HCL 4 MG/2ML IJ SOLN: 4.0000 mg | Freq: Four times a day (QID) | INTRAMUSCULAR | Status: DC | PRN

## 2011-10-27 MED ORDER — LIDOCAINE HCL (PF) 1 % IJ SOLN
INTRAMUSCULAR | Status: AC
  Filled 2011-10-27: qty 60

## 2011-10-27 MED ORDER — ACETAMINOPHEN 325 MG PO TABS: 325.0000 mg | ORAL_TABLET | ORAL | Status: DC | PRN

## 2011-10-27 NOTE — CV Procedure (Signed)
[name] [id number]  [id number]  Preop Dx:VT Postop Dx same/PVC  Procedure: single chamber ICD implantation with intraoperative defibrillation threshold testing  Following the obtaining of informed consent the patient was brought to the electrophysiology laboratory in place of the fluoroscopic table in the supine position.WE used CPAP 2/2 sleep apnea After routine prep and drape, lidocaine was infiltrated in the prepectoral subclavicular region and an incision was made and carried down to the layer of the prepectoral fascia using electrocautery and sharp dissection. A pocket was formed similarly.  Thereafter an attention was turned to gain access to the extrathoracic left subclavian vein which was accomplished without  difficulty and without the aspiration of air or puncture of the artery. A 9 French sheath was placed for which was then passed a  dual coil [employer] active fixation defibrillator lead, model 7122 serial number [id number]. It was  passed under fluoroscopic guidance to the right ventricular apex. In its location the bipolar R wave was 10.9 millivolts, impedance was 741 ohms, the pacing threshold was 0.4 volts at 0.5 msec. Current at threshold was 1.3 mA.  There was no diaphragmatic pacing at 10 V. The current of injury was brisk moderate.  The lead was secured to the prepectoral fascia and then attached to a [employer] [id number] ICD, serial number  [id number] h10.8.  Through the device, the bipolar R wave was 10.8 millivolts, impedance was 551 ohms, the pacing threshold was 0.5 volts at 0.4 msec. High-voltage impedance of 48 ohms.  The pocket was copiously irrigated with antibiotic containing saline solution. Hemostasis was assured, and the device and the lead was placed in the pocket and secured to the prepectoral fascia.  The wound is closed in 3 layers in normal fashion. The wound is washed dried and a benzoin Steri-Strips dressing was then applied. Needle counts sponge counts and  instrument counts were correct at the end of the procedure according to the staff.  At this point, I scrubbed out of the procedure and defibrillation threshold testing was undertaken.  Ventricular fibrillation was induced using T-wave shock. After total duration of 9 seconds, a 25 joule shock was delivered through measured resistance of 47 ohms terminating  ventricular fibrillation and  restoring sinus rhythm.  Patient tolerated the procedure without apparent complication      Cx: None apparent     Sherryl Manges, MD 10/27/2011 9:58 AM

## 2011-10-27 NOTE — Progress Notes (Signed)
Patient Name: [name]   3   SUBJECTIVE: admitted with VT in context of nonischemic CM, DM HTN morbid obesity and untreated OSA  Also has freq PVCs Hx of DVT in past with portacath.  Past Medical History  Diagnosis Date  . Nonischemic dilated cardiomyopathy   . Mild renal insufficiency   . Hyperkalemia   . Hypertension   . Obstructive sleep apnea   . Dyslipidemia   . Diabetes mellitus   . Hyperlipidemia   . DVT of upper extremity (deep vein thrombosis) 2012    right arm  . Sinus drainage   . Blood transfusion   . Anemia   . Chronic kidney disease   . Headache     "stopped when I was a teen"  . Depression   . Arthritis   . Gout     right foot  . Excessive flatus     PHYSICAL EXAM Filed Vitals:   10/27/11 0000 10/27/11 0400 10/27/11 0745 10/27/11 0759  BP: 99/46  109/56   Pulse: 69 74 39 72  Temp:  97.8 F (36.6 C)    TempSrc:  Oral    Resp: 23 17 15 13   Height:      Weight:  328 lb 14.8 oz (149.2 kg)    SpO2: 95% 96% 91% 98%   Well developed and nourished in no acute distress HENT normal Neck supple   Airway #4 Clear Regular rate and rhythm, occ PVC no murmur Abd-soft with active BS No Clubbing cyanosis edema Skin-warm and dry A & Oriented  Grossly normal sensory and motor function \ TELEMETRY: Reviewed telemetry pt in NSR with pvc   Intake/Output Summary (Last 24 hours) at 10/27/11 0803 Last data filed at 10/27/11 1610  Gross per 24 hour  Intake    900 ml  Output   2025 ml  Net  -1125 ml    LABS: Basic Metabolic Panel:  Lab 10/27/11 9604 10/24/11 0430 10/23/11 1707  NA 137 138 --  K 4.3 4.4 --  CL 103 104 --  CO2 26 25 --  GLUCOSE 104* 117* --  BUN 28* 22 --  CREATININE 1.46* 1.16 1.06  CALCIUM 9.1 9.3 --  MG 2.3 -- --  PHOS -- -- --   Cardiac Enzymes: No results found for this basename: CKTOTAL:3,CKMB:3,CKMBINDEX:3,TROPONINI:3 in the last 72 hours CBC:  Lab 10/27/11 0525 10/24/11 0430 10/23/11 1707  WBC 6.9 5.8 7.5    NEUTROABS -- -- --  HGB 11.8* 13.3 12.8*  HCT 36.3* 40.9 39.2  MCV 94.0 95.1 94.7  PLT 164 171 161      ASSESSMENT AND PLAN:  Patient Active Hospital Problem List: Ventricular tachycardia (10/23/2011)   CARDIOMYOPATHY, DILATED (12/01/2008)   Chronic systolic heart failure (12/06/2009)   RENAL DISEASE, CHRONIC, MILD (12/01/2008)   OBSTRUCTIVE SLEEP APNEA (12/01/2008)   Essential hypertension, benign (12/01/2008)     Pt with recurrent prolonged VT NS  On amio and now for ICD.  Have reviewed the potential benefits and risks of ICD implantation including but not limited to death, perforation of heart or lung, lead dislodgement, infection,  device malfunction and inappropriate shocks.  The patient express understanding  and is willing to proceed.    With OSA may defer DFT until anesthesia support in am Needs HgbA1C reassessed Decrease diuretics with BUN/CR worsening  Order D/C today and will need to be resumed in am  Is it more appropriate with Bidil vs amlodipine in this african [place] male Are the  PVC contributing to his cardiomyopathy     Tommie Ard MD  10/27/2011

## 2011-10-28 ENCOUNTER — Inpatient Hospital Stay (HOSPITAL_COMMUNITY): Payer: PRIVATE HEALTH INSURANCE

## 2011-10-28 ENCOUNTER — Encounter (HOSPITAL_COMMUNITY): Payer: Self-pay | Admitting: Cardiology

## 2011-10-28 LAB — BASIC METABOLIC PANEL
BUN: 25 mg/dL — ABNORMAL HIGH (ref 6–23)
Calcium: 8.9 mg/dL (ref 8.4–10.5)
Creatinine, Ser: 1.37 mg/dL — ABNORMAL HIGH (ref 0.50–1.35)
GFR calc non Af Amer: 51 mL/min — ABNORMAL LOW (ref >90)
Glucose, Bld: 121 mg/dL — ABNORMAL HIGH (ref 70–99)
Potassium: 4.6 mEq/L (ref 3.5–5.1)

## 2011-10-28 LAB — GLUCOSE, CAPILLARY
Glucose-Capillary: 114 mg/dL — ABNORMAL HIGH (ref 70–99)
Glucose-Capillary: 85 mg/dL (ref 70–99)
Glucose-Capillary: 139 mg/dL — ABNORMAL HIGH (ref 70–99)
Glucose-Capillary: 124 mg/dL — ABNORMAL HIGH (ref 70–99)

## 2011-10-28 MED ORDER — ALLOPURINOL 100 MG PO TABS: 200.0000 mg | ORAL_TABLET | Freq: Every day | ORAL | Status: DC

## 2011-10-28 MED ORDER — AMIODARONE HCL 400 MG PO TABS: ORAL_TABLET | ORAL | Status: DC

## 2011-10-28 MED ORDER — MAGNESIUM HYDROXIDE 400 MG/5ML PO SUSP
30.0000 mL | Freq: Every day | ORAL | Status: DC | PRN
  Administered 2011-10-28: 30 mL via ORAL
  Filled 2011-10-28: qty 30

## 2011-10-28 MED ORDER — SORBITOL 70 % SOLN
30.0000 mL | Freq: Once | Status: AC
  Administered 2011-10-28: 20:00:00 30 mL via ORAL
  Filled 2011-10-28: qty 30

## 2011-10-28 MED ORDER — HYDROCODONE-ACETAMINOPHEN 5-325 MG PO TABS
1.0000 | ORAL_TABLET | Freq: Four times a day (QID) | ORAL | Status: DC | PRN
  Administered 2011-10-28 (×2): 1 via ORAL
  Filled 2011-10-28 (×2): qty 1

## 2011-10-28 NOTE — Progress Notes (Signed)
   ELECTROPHYSIOLOGY ROUNDING NOTE    Patient Name: Crystopher Durst Date of Encounter: 10-28-2011    SUBJECTIVE:Patient feels well.  No chest pain or shortness of breath.  Minimal incisional soreness.  S/p single chamber ICD implantation 10-27-2011.  TELEMETRY: Reviewed telemetry pt in sinus rhythm with PVC's Filed Vitals:   10/27/11 1400 10/27/11 1500 10/28/11 0010 10/28/11 0510  BP: 136/75 120/94 134/68 141/68  Pulse: 77 75 75 68  Temp:   98.1 F (36.7 C) 97.9 F (36.6 C)  TempSrc:   Oral Oral  Resp: 20 17 19 20   Height:      Weight:   326 lb 8 oz (148.1 kg)   SpO2: 98% 97% 96% 97%    Intake/Output Summary (Last 24 hours) at 10/28/11 0645 Last data filed at 10/28/11 0500  Gross per 24 hour  Intake      0 ml  Output    425 ml  Net   -425 ml    LABS: Basic Metabolic Panel:  Basename 10/27/11 0525  NA 137  K 4.3  CL 103  CO2 26  GLUCOSE 104*  BUN 28*  CREATININE 1.46*  CALCIUM 9.1  MG 2.3  PHOS --   CBC:  Basename 10/27/11 0525  WBC 6.9  NEUTROABS --  HGB 11.8*  HCT 36.3*  MCV 94.0  PLT 164     Radiology/Studies:  Final result pending, lead in stable position.  PHYSICAL EXAM Left chest without hematoma.   Well developed and nourished in no acute distress HENT normal Neck supple with JVP-flat Carotids brisk and full without bruits Clear Regular rate and rhythm, no murmurs or gallops Abd-soft with active BS without hepatomegaly No Clubbing cyanosis edema Skin-warm and dry A & Oriented  Grossly normal sensory and motor function   DEVICE INTERROGATION: Device interrogated by industry.  Lead values including impedence, sensing, threshold within normal values.    Patient lives at home alone and is concerned about ability to care for himself at discharge.  He is requesting case management consult to discuss short term stay at [facility] in [place].  Wound care, arm mobility, restrictions reviewed with patient.  Discharge plans per Dr Graciela Husbands--  Dr Graciela Husbands to clarify driving restrictions.   Concerned about him to be delayed on his own with his left arm being out of commission. We will have PT see him and see if he would qualify for short-term rehabilitation. If not we will discharge him later today. We'll have case management seem to see if any services that might be of assistance in available  Followup will be at [facility]

## 2011-10-28 NOTE — Care Management Note (Signed)
    Page 1 of 1   10/28/2011     2:07:21 PM   CARE MANAGEMENT NOTE 10/28/2011  Patient:  [name],[name]   Account Number:  [id number]  Date Initiated:  10/23/2011  Documentation initiated by:  Junius Creamer  Subjective/Objective Assessment:   adm w arrthymia     Action/Plan:   lives alone, pcp dr Beatrix Fetters shah   Anticipated DC Date:  10/31/2011   Anticipated DC Plan:  SKILLED NURSING FACILITY  In-house referral  Clinical Social Worker      DC Associate Professor  CM consult              Status of service:  Completed, signed off  Discharge Disposition:  SKILLED NURSING FACILITY  Per UR Regulation:  Reviewed for med. necessity/level of care/duration of stay   Comments:  10/28/11, Kathi Der RNC-MNN, BSN, [phone], CM received referral for placement.  CSW working with pt.  CM signing off. 10/3 14:47p debbie dowell rn,bsn [id number]

## 2011-10-28 NOTE — Evaluation (Signed)
Physical Therapy Evaluation Patient Details Name: [name] MRN: [id number] DOB: 1940/03/21 Today's Date: 10/28/2011 Time: 8295-6213 PT Time Calculation (min): 22 min  PT Assessment / Plan / Recommendation Clinical Impression  Pt admitted with Vtach s/p ICD placement. Pt with limited LUE movement due to restrictions and unable to perform basic transfers and ADLs on his own at this time. Pt will benefit from ST-SNF for therapy to progress activity as ROM increases with restrictions prior to return home. Pt will benefit from acute therapy to maximize mobility, transfers and adherence to precautions prior to discharge.     PT Assessment  Patient needs continued PT services    Follow Up Recommendations  Supervision for mobility/OOB;Post acute inpatient    Does the patient have the potential to tolerate intense rehabilitation   No, Recommend SNF  Barriers to Discharge Decreased caregiver support      Equipment Recommendations  None recommended by PT    Recommendations for Other Services OT consult   Frequency Min 3X/week    Precautions / Restrictions Precautions Precautions: Fall;ICD/Pacemaker   Pertinent Vitals/Pain Soreness just above incision HR 82-92 with activity O2 98% on RA with RR up to 30 with activity and back to 22 at rest      Mobility  Bed Mobility Bed Mobility: Rolling Right;Right Sidelying to Sit;Sitting - Scoot to Edge of Bed Rolling Right: 4: Min assist;With rail Right Sidelying to Sit: 4: Min assist;With rails;HOB elevated (HOB 20degrees) Sitting - Scoot to Edge of Bed: 5: Supervision Transfers Transfers: Sit to Stand;Stand to Sit Sit to Stand: 5: Supervision;From bed Stand to Sit: 5: Supervision;To chair/3-in-1 Details for Transfer Assistance: cueing for hand placement, safety and sequence to maintain precautions Ambulation/Gait Ambulation/Gait Assistance: 5: Supervision Ambulation Distance (Feet): 180 Feet Assistive device: Rolling  walker Ambulation/Gait Assistance Details: cueing for position in RW and maintaining appropriate position with LUE Gait Pattern: Step-through pattern;Decreased stride length Gait velocity: decreased Stairs: No    Shoulder Instructions     Exercises     PT Diagnosis: Difficulty walking  PT Problem List: Decreased range of motion;Decreased activity tolerance;Decreased mobility;Decreased knowledge of precautions;Decreased knowledge of use of DME PT Treatment Interventions: Gait training;DME instruction;Functional mobility training;Therapeutic activities;Patient/family education   PT Goals Acute Rehab PT Goals PT Goal Formulation: With patient Time For Goal Achievement: 11/04/11 Potential to Achieve Goals: Good Pt will go Supine/Side to Sit: with supervision;with HOB 0 degrees PT Goal: Supine/Side to Sit - Progress: Goal set today Pt will go Sit to Supine/Side: with supervision;with HOB 0 degrees PT Goal: Sit to Supine/Side - Progress: Goal set today Pt will go Sit to Stand: with modified independence PT Goal: Sit to Stand - Progress: Goal set today Pt will go Stand to Sit: with modified independence PT Goal: Stand to Sit - Progress: Goal set today Pt will Ambulate: with modified independence;with least restrictive assistive device;>150 feet PT Goal: Ambulate - Progress: Goal set today Pt will Go Up / Down Stairs: 1-2 stairs;with rail(s);with supervision PT Goal: Up/Down Stairs - Progress: Goal set today  Visit Information  Last PT Received On: 10/28/11 Assistance Needed: +1    Subjective Data  Subjective: I just think I need some help without using this arm. Patient Stated Goal: be able to get bathed and go to the bathroom by myself   Prior Functioning  Home Living Lives With: Alone Type of Home: House Home Access: Stairs to enter [employer] of Steps: 3 Entrance Stairs-Rails: Right Home Layout: One level Bathroom Shower/Tub: Tub/shower  unit Bathroom Toilet:  Handicapped height Home Adaptive Equipment: Straight cane;Walker - rolling;Walker - standard;Shower chair with back Prior Function Level of Independence: Independent Able to Take Stairs?: Yes Driving: No Vocation: Retired Musician: No difficulties    Cognition  Overall Cognitive Status: Appears within functional limits for tasks assessed/performed Arousal/Alertness: Awake/alert Orientation Level: Appears intact for tasks assessed Behavior During Session: [facility] for tasks performed    Extremity/Trunk Assessment Right Upper Extremity Assessment RUE ROM/Strength/Tone: [facility] for tasks assessed Left Upper Extremity Assessment LUE ROM/Strength/Tone: Deficits LUE ROM/Strength/Tone Deficits: limited ROM due to precautions Right Lower Extremity Assessment RLE ROM/Strength/Tone: [facility] for tasks assessed Left Lower Extremity Assessment LLE ROM/Strength/Tone: WFL for tasks assessed Trunk Assessment Trunk Assessment: Normal   Balance    End of Session PT - End of Session Equipment Utilized During Treatment: Gait belt Activity Tolerance: Patient tolerated treatment well Patient left: in chair;with call bell/phone within reach;Other (comment) (with sling reapplied) Nurse Communication: Mobility status  GP     Toney Sang Beth 10/28/2011, 4:22 PM  Delaney Meigs, PT [phone]

## 2011-10-28 NOTE — Discharge Summary (Signed)
Discharge Summary   Patient ID: [name] MRN: [id number], DOB/AGE: 1940/04/02 71 y.o.  Primary MD: Kirstie Peri, MD Primary Cardiologist: Dr. Kirke Corin in [facility] Primary Electrophysiologist: Dr. Johney Frame Admit date: 10/23/2011 D/C date:     10/29/2011      Primary Discharge Diagnoses:  1. Ventricular Tachycardia  - s/p Medtronic single chamber ICD 10/27/11  - Initiated on Amiodarone  2. Nonischemic Cardiomyopathy  - Echo 10/2 showed EF 35-40%, global HK  - Cath 10/4 showed normal coronaries, EF 40-45%  3. Gout, Right Foot  - Initiated on allopurinol, F/u w/ PCP  4. Obstructive Sleep Apnea  - F/u with PCP  5. Mild Renal Insufficiency  - Will need BMET in one week with results sent to [facility] in [place] attention Gene Serpe, PA-C  6. Deconditioning  - Dc'd to short term rehab at the [facility] in [place]   Secondary Discharge Diagnoses:  Marland Kitchen Mild renal insufficiency   . Hyperkalemia   . Hypertension   . Obstructive sleep apnea   . Dyslipidemia   . Diabetes mellitus   . DVT of upper extremity (deep vein thrombosis) 2012    right arm  . Sinus drainage   . Blood transfusion   . Anemia   . Chronic kidney disease   . Headache     "stopped when I was a teen"  . Depression   . Arthritis   . Excessive flatus    Allergies Allergies  Allergen Reactions  . Codeine Other (See Comments)    Bones ache  . Milk-Related Compounds   . Warfarin Sodium     REACTION: possible Coumadin induced necrosis    Diagnostic Studies/Procedures:   2D Echo - 10/22/11 Findings: Mild concentric LVH. Global hypokinesis of LV with minor regional variation. EF 35-40%. Abnormal LV diastolic filling is observed, consistent with impaired relaxation. The LV diastolic filling pattern is consistent with elevated left atrial pressure. LA mildly dilated. MV leaflets mildly thickened. Mild MR. Trace TR. Unable to estimate RVSP. No pericardial effusion.  10/24/11 - Cardiac Cath Hemodynamics:  LV  pressure: 101/15  Aortic pressure: 101/49  Angiography  Left Main: The Left main is smooth and normal.  Left anterior Descending: The LAD is smooth, large and fairly normal.  Left Circumflex: The LCx is a large system. The 1st OM is large, smooth and normal  Right Coronary Artery: The RCA is large, smooth and normal.  LV Gram: The LV is moderately dilated. There is diffuse mild hypokinesis. The EF is 40-45%.  Conclusions:  1. Normal coronaries  2. Hx of VT  3. Mild -moderate LV dysfunction  10/27/11 - ICD Implantation - St Jude 7122 active fixation defibrillator lead, model 7122 serial number [id number] into the right ventricular apex - Medtronic [id number] ICD, serial number [id number] h10.8   History of Present Illness: 71 y.o. male w/ the above medical problems who transferred from [facility] to [facility] on 10/23/11 after complaining of chest pain and found to have Monomorphic Ventricular Tachycardia.  Hospital Course: At [facility] he was initially found to have frequent ectopy, with runs of 1-2 minutes of monomorphic VT (rates 180-210)  during which he was asymptomatic. He was bolused with lidocaine and placed on a drip. He did not require emergent defibrillation. EKG revealed NSR occasional PVCs, old anterior infarct, 92 bpm; QTc 456. CXR was without acute cardiopulmonary abnormalities. Labs were significant for normal troponin x2, CK 220, CKMB 4.3, BNP 30, Mg 2.1, K+ 3.9, Glucose 220, BUN/Crt 36/1.51. Echo  showed EF 35-40% with global LV hypokinesis. The lidocaine drip was stopped and he developed recurrent VT with ambulation that self terminated, prompting transfer to [facility] for further evaluation and treatment. During transport he was given IV amio and placed on drip for wide complex tachycardia consistent with VT, during which he remained hemodynamically stable.   Dr. Johney Frame evaluated him and felt he would benefit from ICD implantation, but ischemic causes needed  to be ruled out prior to placement. Cath 10/24/11 revealed normal coronary arteries, EF 40-45%. He tolerated the procedure well without complications. Recommendations were made for placement of ICD. IV amiodarone was switched to oral dosing. He had no further VT on telemetry. Placement of Medtronic single chamber ICD was successfully performed on 10/27/11. He tolerated the procedure well without complications. Postop CXR was without evidence of pneumothorax or other acute findings. The device was interrogated revealing lead values including impedence, sensing, threshold to be within normal values. He will remain on amiodarone 400mg  BID for 7 days then 400mg  daily.  He complained of foot pain for which uric acid level was obtained and found to be elevated. He was continued on colchicine and placed on allopurinol with plans to follow up with his PCP. Patient lives at home alone and was concerned about the ability to care for himself at discharge and therefore requested discharge to [facility] in [place] for short term rehab. PT evaluated him and felt this was appropriate and social work made the arrangements. He was seen and evaluated by Dr. Graciela Husbands who felt he was stable for discharge with plans for follow up as scheduled below. He was instructed not to drive until cleared by Dr. Johney Frame at his 3 month follow up.  Discharge Vitals: Blood pressure 126/68, pulse 71, temperature 97.6 F (36.4 C), temperature source Oral, resp. rate 17, height 6' (1.829 m), weight 318 lb 12.6 oz (144.6 kg), SpO2 97.00%.  Labs: Component Value Date   WBC 6.9 10/27/2011   HGB 11.8* 10/27/2011   HCT 36.3* 10/27/2011   MCV 94.0 10/27/2011   PLT 164 10/27/2011    Lab 10/28/11 0540  NA 138  K 4.6  CL 105  CO2 24  BUN 25*  CREATININE 1.37*  CALCIUM 8.9  GLUCOSE 121*     10/26/2011 07:01  Uric Acid, Serum 9.9 (H)   Labs at [facility] 10/23/11 - Sodium 140, potassium 3.9, chloride 104, CO2 of 26, glucose 220, BUN 36, creatinine 1.51,  calcium 8.9. CK 220 then 221. MB 4.3 then 4.1. troponin negative x2. INR 1.0. BNP 30. WBC 7300, hemoglobin 13.8, hematocrit 41.5, platelet count 202,000, magnesium 2.1, TSH 1.2   Discharge Medications     Medication List     As of 10/29/2011 12:25 PM    STOP taking these medications         ciprofloxacin 500 MG tablet   Commonly known as: CIPRO      TAKE these medications         allopurinol 100 MG tablet   Commonly known as: ZYLOPRIM   Take 2 tablets (200 mg total) by mouth daily.      amiodarone 400 MG tablet   Commonly known as: PACERONE   Take 400mg  twice a day for 7 days, then take once daily      amLODipine 10 MG tablet   Commonly known as: NORVASC   TAKE 1 TABLET ONCE DAILY      ASPIR-81 81 MG EC tablet   Generic drug: aspirin  Take 81 mg by mouth daily.      AVODART 0.5 MG capsule   Generic drug: dutasteride   Take 0.5 mg by mouth daily.      Cetirizine HCl 10 MG Caps   Take 10 mg by mouth daily.      COLCRYS 0.6 MG tablet   Generic drug: colchicine   Take 0.6 mg by mouth daily as needed. For gout pain      COREG 25 MG tablet   Generic drug: carvedilol   Take 25 mg by mouth 2 (two) times daily with a meal.      CYMBALTA 30 MG capsule   Generic drug: DULoxetine   Take 30 mg by mouth daily.      fluticasone 50 MCG/ACT nasal spray   Commonly known as: FLONASE   Place 2 sprays into the nose daily as needed. For dry nose      LASIX 40 MG tablet   Generic drug: furosemide   Take 40 mg by mouth every morning.      lisinopril 40 MG tablet   Commonly known as: PRINIVIL,ZESTRIL   Take 40 mg by mouth daily.      pravastatin 40 MG tablet   Commonly known as: PRAVACHOL   Take 40 mg by mouth daily.      VICODIN 5-500 MG per tablet   Generic drug: HYDROcodone-acetaminophen   Take 1 tablet by mouth daily as needed. For pain          Disposition   Discharge Orders    Future Appointments: Provider: Department: Dept Phone: Center:   11/05/2011  10:00 AM [facility] [phone] [facility]   11/14/2011 9:20 AM Prescott Parma, PA [facility] [place] [phone] [facility]   02/02/2012 9:45 AM Hillis Range, MD [facility] [place] [phone] [facility]     Future Orders Please Complete By Expires   Diet - low sodium heart healthy      Increase activity slowly      Discharge instructions      Comments:   * KEEP GROIN SITE CLEAN AND DRY. Call the office for any signs of bleedings, pus, swelling, increased pain, or any other concerns.  * Please take all medications as prescribed and bring them with you to your office visits.  * No driving until you follow up with Dr. Johney Frame in 3 months.  * You will need to have blood drawn at the [facility] to reassess your kidney function (Basic Metabolic Panel) in one week with results sent to [facility] in [place] attention Gene Serpe, PA-C     Follow-up Information    Follow up with [place]. On 11/05/2011. [facility] Check @ 10:00)    Contact information:   [address] [place] [address] [phone]      Follow up with SERPE, EUGENE, PA. On 11/14/2011. (9:20)    Contact information:   [facility] [address], [address] [place] [address] [phone]       Follow up with Hillis Range, MD. On 02/02/2012. (9:45)    Contact information:   [facility] [address] [place], [address] [place] [address] [phone]       Schedule an appointment as soon as possible for a visit with [facility], MD. (Follow up with your PCP regarding your obstructive sleep apnea)    Contact information:   [address]  [place] [address] [phone]  Outstanding Labs/Studies:   1. BMET in one week 2. Will need monitoring of LFTs/TFTs/PFTs etc as he was initiated on amiodarone   Duration of Discharge Encounter: Greater than 30 minutes including physician and PA time.  Signed, Julieanna Geraci PA-C 10/29/2011,  12:25 PM

## 2011-10-29 DIAGNOSIS — I10 Essential (primary) hypertension: Secondary | ICD-10-CM

## 2011-10-29 MED ORDER — AMIODARONE HCL 400 MG PO TABS: ORAL_TABLET | ORAL | Status: DC

## 2011-10-29 NOTE — Clinical Social Work Placement (Signed)
     Clinical Social Work Department CLINICAL SOCIAL WORK PLACEMENT NOTE 10/29/2011  Patient:  BERKELEY, VANAKEN  Account Number:  [id number] Admit date:  10/23/2011  Clinical Social Worker:  Genelle Bal, LCSW  Date/time:  10/29/2011 12:38 PM  Clinical Social Work is seeking post-discharge placement for this patient at the following level of care:   SKILLED NURSING   (*CSW will update this form in Epic as items are completed)     Patient/family provided with [facility] Department of Clinical Social Works list of facilities offering this level of care within the geographic area requested by the patient (or if unable, by the patients family).  10/28/2011  Patient/family informed of their freedom to choose among providers that offer the needed level of care, that participate in Medicare, Medicaid or managed care program needed by the patient, have an available bed and are willing to accept the patient.    Patient/family informed of [facility] ownership interest in [facility], as well as of the fact that they are under no obligation to receive care at this facility.  PASARR submitted to EDS on  PASARR number received from EDS on   FL2 transmitted to all facilities in geographic area requested by pt/family on  10/29/2011 FL2 transmitted to all facilities within larger geographic area on   Patient informed that his/her managed care company has contracts with or will negotiate with  certain facilities, including the following:     Patient/family informed of bed offers received:  10/29/2011 Patient chooses bed at [facility] Physician recommends and patient chooses bed at    Patient to be transferred to [facility] on  10/29/2011 Patient to be transferred to facility by ambulance  The following physician request were entered in Epic:   Additional Comments: 10/28/11: Patient had been to [facility] before and desires to return there for  short-term rehab. **Patient has existing PASARR number.

## 2011-10-29 NOTE — Clinical Social Work Psychosocial (Addendum)
    Clinical Social Work Department BRIEF PSYCHOSOCIAL ASSESSMENT 10/29/2011  Patient:  [name], [name]     Account Number:  [id number]     Admit date:  10/23/2011  Clinical Social Worker:  Delmer Islam  Date/Time:  10/29/2011 11:31 AM  Referred by:  Physician  Date Referred:  10/28/2011 Referred for  SNF Placement   Other Referral:   Interview type:  Patient Other interview type:    PSYCHOSOCIAL DATA Living Status:  ALONE Admitted from facility:   Level of care:   Primary support name:   Primary support relationship to patient:   Degree of support available:   Patient advised that he has 2 nieces that both work at [employer].    CURRENT CONCERNS Current Concerns  Post-Acute Placement   Other Concerns:    SOCIAL WORK ASSESSMENT / PLAN CSW talked with patient on 10/28/11 about discharge plans and his desire to go to [facility] for short-term rehab. Patient confirmed that he does want rehab at facility named. Patient also informed CSW that he has been there before and has two nieces that work there.    CSW advised patient that information will be forwarded to facility and CSW will talk with admissions staff regarding bed availability.   Assessment/plan status:   Other assessment/ plan:   Information/referral to community resources:    PATIENT'S/FAMILY'S RESPONSE TO PLAN OF CARE: Patient very pleasant and open to talking with CSW. Patient already knows where he wants to go and advised CSW of contact person at [facility].

## 2011-10-29 NOTE — Progress Notes (Signed)
  Patient Name: [name]      SUBJECTIVE without complaint    Past Medical History  Diagnosis Date  . Nonischemic dilated cardiomyopathy     Echo 10/2 showed EF 35-40%, global HK; Cath 10/4 showed normal coronaries, EF 40-45%  . Mild renal insufficiency   . Hyperkalemia   . Hypertension   . Obstructive sleep apnea   . Dyslipidemia   . Diabetes mellitus   . DVT of upper extremity (deep vein thrombosis) 2012    right arm  . Sinus drainage   . Blood transfusion   . Anemia   . Chronic kidney disease   . Headache     "stopped when I was a teen"  . Depression   . Arthritis   . Gout     right foot  . Excessive flatus   . Ventricular tachycardia     s/p Medtronic single chamber ICD 10/27/11    PHYSICAL EXAM Filed Vitals:   10/28/11 1955 10/29/11 0045 10/29/11 0500 10/29/11 0700  BP: 121/51 135/54 118/66 126/68  Pulse: 68 65 64 71  Temp: 97.4 F (36.3 C) 97.9 F (36.6 C) 97.7 F (36.5 C) 97.6 F (36.4 C)  TempSrc: Oral Oral Oral Oral  Resp: 14 18 19 17   Height:      Weight:  318 lb 12.6 oz (144.6 kg)    SpO2:   97% 97%    Well developed and nourished in no acute distress HENT normal Neck supple with JVP-flat Clear Regular rate and rhythm, no murmurs or gallops Abd-soft with active BS No Clubbing cyanosis edema Skin-warm and dry A & Oriented  Grossly normal sensory and motor function     Intake/Output Summary (Last 24 hours) at 10/29/11 1123 Last data filed at 10/29/11 0050  Gross per 24 hour  Intake    120 ml  Output    650 ml  Net   -530 ml    LABS: Basic Metabolic Panel:  Lab 10/28/11 1324 10/27/11 0525 10/24/11 0430 10/23/11 1707  NA 138 137 138 --  K 4.6 4.3 4.4 --  CL 105 103 104 --  CO2 24 26 25  --  GLUCOSE 121* 104* 117* --  BUN 25* 28* 22 --  CREATININE 1.37* 1.46* 1.16 1.06  CALCIUM 8.9 9.1 -- --  MG -- 2.3 -- --  PHOS -- -- -- --   Cardiac Enzymes: No results found for this basename: CKTOTAL:3,CKMB:3,CKMBINDEX:3,TROPONINI:3  in the last 72 hours CBC:  Lab 10/27/11 0525 10/24/11 0430 10/23/11 1707  WBC 6.9 5.8 7.5  NEUTROABS -- -- --  HGB 11.8* 13.3 12.8*  HCT 36.3* 40.9 39.2  MCV 94.0 95.1 94.7  PLT 164 171 161      ASSESSMENT AND PLAN:  Patient Active Hospital Problem List: Ventricular tachycardia (10/23/2011)  CARDIOMYOPATHY, DILATED (12/01/2008)  Chronic systolic heart failure (12/06/2009)  RENAL DISEASE, CHRONIC, MILD (12/01/2008)  OBSTRUCTIVE SLEEP APNEA (12/01/2008)  DYSLIPIDEMIA (12/01/2008)  Essential hypertension, benign (12/01/2008)  Plan is discharge today to nursing home for short term rehab Stop cipro amio 400 bid x 7days then 400 daily Wound check 2 weeks ; recheck BMET Needs cards followup 3-4 weeks And EP followup 3 months Needs followup with PCP re OSA   Signed, Sherryl Manges MD  10/29/2011

## 2011-11-05 ENCOUNTER — Encounter: Payer: Self-pay | Admitting: Internal Medicine

## 2011-11-05 ENCOUNTER — Ambulatory Visit (INDEPENDENT_AMBULATORY_CARE_PROVIDER_SITE_OTHER): Payer: PRIVATE HEALTH INSURANCE | Admitting: *Deleted

## 2011-11-05 DIAGNOSIS — I428 Other cardiomyopathies: Secondary | ICD-10-CM

## 2011-11-05 NOTE — Progress Notes (Signed)
Wound check defib in clinic  

## 2011-11-06 LAB — ICD DEVICE OBSERVATION
CHARGE TIME: 4.404 s
DEV-0020ICD: NEGATIVE
RV LEAD IMPEDENCE ICD: 418 Ohm
RV LEAD THRESHOLD: 1 V
TOT-0001: 1
TOT-0002: 0
TOT-0006: 20131007000000
TZAT-0001FASTVT: 1
TZAT-0002FASTVT: NEGATIVE
TZAT-0004SLOWVT: 8
TZAT-0004SLOWVT: 8
TZAT-0005SLOWVT: 88 pct
TZAT-0005SLOWVT: 91 pct
TZAT-0011SLOWVT: 10 ms
TZAT-0011SLOWVT: 10 ms
TZAT-0012SLOWVT: 170 ms
TZAT-0012SLOWVT: 170 ms
TZAT-0013SLOWVT: 3
TZAT-0013SLOWVT: 3
TZAT-0018FASTVT: NEGATIVE
TZAT-0018SLOWVT: NEGATIVE
TZAT-0018SLOWVT: NEGATIVE
TZAT-0019FASTVT: 8 V
TZON-0003SLOWVT: 300 ms
TZON-0004SLOWVT: 36
TZON-0005SLOWVT: 12
TZST-0001FASTVT: 2
TZST-0001FASTVT: 3
TZST-0001FASTVT: 6
TZST-0001SLOWVT: 3
TZST-0001SLOWVT: 4
TZST-0001SLOWVT: 6
TZST-0002FASTVT: NEGATIVE
TZST-0002FASTVT: NEGATIVE
TZST-0002FASTVT: NEGATIVE
TZST-0003SLOWVT: 35 J

## 2011-11-14 ENCOUNTER — Encounter: Payer: Self-pay | Admitting: Physician Assistant

## 2011-11-14 ENCOUNTER — Ambulatory Visit (INDEPENDENT_AMBULATORY_CARE_PROVIDER_SITE_OTHER): Payer: PRIVATE HEALTH INSURANCE | Admitting: Physician Assistant

## 2011-11-14 ENCOUNTER — Other Ambulatory Visit: Payer: Self-pay | Admitting: Physician Assistant

## 2011-11-14 VITALS — BP 130/78 | HR 72 | Ht 72.0 in | Wt 338.4 lb

## 2011-11-14 DIAGNOSIS — I429 Cardiomyopathy, unspecified: Secondary | ICD-10-CM

## 2011-11-14 DIAGNOSIS — N289 Disorder of kidney and ureter, unspecified: Secondary | ICD-10-CM

## 2011-11-14 DIAGNOSIS — I428 Other cardiomyopathies: Secondary | ICD-10-CM

## 2011-11-14 DIAGNOSIS — I472 Ventricular tachycardia, unspecified: Secondary | ICD-10-CM

## 2011-11-14 DIAGNOSIS — Z9229 Personal history of other drug therapy: Secondary | ICD-10-CM

## 2011-11-14 DIAGNOSIS — Z79899 Other long term (current) drug therapy: Secondary | ICD-10-CM

## 2011-11-14 DIAGNOSIS — I10 Essential (primary) hypertension: Secondary | ICD-10-CM

## 2011-11-14 DIAGNOSIS — N182 Chronic kidney disease, stage 2 (mild): Secondary | ICD-10-CM

## 2011-11-14 NOTE — Progress Notes (Signed)
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 Patient also initiated on amiodarone therapy.  Patient initially presented here at [facility] with CP, and found to have asymptomatic VT. Cardiac markers negative. Initial evaluation with 2-D echo yielded EF 35-40%, global HK. Due to recurrent VT, recommendation was to transfer to [facility].  Subsequent cardiac catheterization yielded normal coronary arteries; EF 40-45%. Patient referred to Dr. Johney Frame, and underwent successful implantation of a single-chamber Medtronic ICD, with no noted complications.  Patient initiated on amiodarone at 400 twice a day (x7 days), then to continue at 400 mg daily.  Clinically, patient has been doing extremely well since recent hospitalization. He is currently at the [facility] for rehabilitation. He denies any CP, SOB, orthopnea, significant LE edema, tachycardia palpitations, or ICD discharge.  12-lead EKG today indicates NSR 69 bpm with first-degree AVB; non- specific ST changes  Allergies  Allergen Reactions  . Codeine Other (See Comments)    Bones ache  . Milk-Related Compounds   . Warfarin Sodium     REACTION: possible Coumadin induced necrosis    Current Outpatient Prescriptions  Medication Sig Dispense Refill  . allopurinol (ZYLOPRIM) 100 MG tablet Take 2 tablets (200 mg total) by mouth daily.  30 tablet  0  . amiodarone (PACERONE) 400 MG tablet Take 400mg  twice a day for 7 days, then take once daily  60 tablet  6  . amLODipine (NORVASC) 10 MG tablet TAKE 1 TABLET ONCE DAILY  30 tablet  6  . aspirin (ASPIR-81) 81 MG EC tablet Take 81 mg by mouth daily.        . carvedilol (COREG) 25 MG tablet Take 25 mg by mouth 2 (two) times daily with a meal.       . Cetirizine HCl 10 MG CAPS Take 10 mg by mouth daily.       . colchicine (COLCRYS) 0.6 MG tablet Take 0.6  mg by mouth daily as needed. For gout pain      . DULoxetine (CYMBALTA) 30 MG capsule Take 30 mg by mouth daily.        Marland Kitchen dutasteride (AVODART) 0.5 MG capsule Take 0.5 mg by mouth daily.       . finasteride (PROSCAR) 5 MG tablet Take 5 mg by mouth daily.      . fluticasone (FLONASE) 50 MCG/ACT nasal spray Place 2 sprays into the nose daily as needed. For dry nose      . furosemide (LASIX) 40 MG tablet Take 40 mg by mouth every morning.        Marland Kitchen HYDROcodone-acetaminophen (VICODIN) 5-500 MG per tablet Take 1 tablet by mouth daily as needed. For pain      . lisinopril (PRINIVIL,ZESTRIL) 40 MG tablet Take 40 mg by mouth daily.        . pravastatin (PRAVACHOL) 40 MG tablet Take 40 mg by mouth daily.          Past Medical History  Diagnosis Date  . Nonischemic dilated cardiomyopathy     Echo 10/2 showed EF 35-40%, global HK; Cath 10/4 showed normal coronaries, EF 40-45%  . Mild renal insufficiency   . Hyperkalemia   . Hypertension   . Obstructive sleep apnea   . Dyslipidemia   . Diabetes mellitus   . DVT of upper extremity (deep vein thrombosis) 2012    right arm  . Sinus drainage   .  Blood transfusion   . Anemia   . Chronic kidney disease   . Headache     "stopped when I was a teen"  . Depression   . Arthritis   . Gout     right foot  . Excessive flatus   . Ventricular tachycardia     s/p Medtronic single chamber ICD 10/27/11    Past Surgical History  Procedure Date  . Bunionectomy     right foot  . Carpal tunnel release     right hand  . Joint replacement   . Total hip arthroplasty     left  . Total knee arthroplasty     right  . Appendectomy 1961  . Tonsillectomy and adenoidectomy ~ 1950  . Cataract extraction w/ intraocular lens  implant, bilateral     History   Social History  . Marital Status: Single    Spouse Name: N/A    Number of Children: N/A  . Years of Education: N/A   Occupational History  . Not on file.   Social History Main Topics  . Smoking  status: Former Smoker -- 1.0 packs/day for 15 years    Types: Cigarettes    Quit date: 01/20/1982  . Smokeless tobacco: Not on file   Comment: stopped smoking cigarettes 1984  . Alcohol Use: Yes     "stopped drinking alcohol 1984"  . Drug Use: No  . Sexually Active: No   Other Topics Concern  . Not on file   Social History Narrative  . No narrative on file    No family history on file.  ROS: no nausea, vomiting; no fever, chills; no melena, hematochezia; no claudication  PHYSICAL EXAM: BP 130/78  Pulse 72  Ht 6' (1.829 m)  Wt 338 lb 6.4 oz (153.497 kg)  BMI 45.90 kg/m2  SpO2 93% GENERAL: 71 year old male, morbidly obese; NAD HEENT: NCAT, PERRLA, EOMI; sclera clear; no xanthelasma NECK: palpable bilateral carotid pulses, no bruits; unable to assess JVD, secondary to neck girth; no TM LUNGS: Faint, late crackles; no wheezes CARDIAC: RRR (S1, S2); no significant murmurs; no rubs or gallops ABDOMEN: soft, non-tender; intact BS EXTREMETIES: no significant peripheral edema SKIN: Stable ICD pocket site, no hematoma/ecchymosis MUSCULOSKELETAL: no joint deformity NEURO: no focal deficit; NL affect   EKG: reviewed and available in Electronic Records   ASSESSMENT & PLAN:  Ventricular tachycardia Quiescent on current medication regimen, status post amiodarone therapy initiation, currently at 400 mg daily. Status post ICD implantation, with no interim shock. Baseline LFTs and TSH levels normal, prior to initiation of therapy. Will order baseline PFTs. Patient to followup with Dr. Johney Frame in the next 2 months, as scheduled, for ICD followup.   CARDIOMYOPATHY, DILATED Stable on current diuretic regimen, with no history or exam findings suggestive of CHF. EF 40-45%, by recent catheterization. We'll check followup metabolic profile, including BNP level.  Essential hypertension, benign Stable on current medication regimen  RENAL DISEASE, CHRONIC, MILD Will check a followup  metabolic profile for continued close monitoring of electrolytes and renal function.    Gene Deanthony Maull, PAC

## 2011-11-14 NOTE — Assessment & Plan Note (Signed)
Will check a followup metabolic profile for continued close monitoring of electrolytes and renal function.

## 2011-11-14 NOTE — Assessment & Plan Note (Signed)
Stable on current diuretic regimen, with no history or exam findings suggestive of CHF. EF 40-45%, by recent catheterization. We'll check followup metabolic profile, including BNP level.

## 2011-11-14 NOTE — Patient Instructions (Signed)
   Labs:  BMET, BNP   Please fax results to:  [phone]   PFT (pulmonary function test)  Office will contact with results Follow up in  4-6 weeks

## 2011-11-14 NOTE — Assessment & Plan Note (Signed)
Stable on current medication regimen 

## 2011-11-14 NOTE — Assessment & Plan Note (Signed)
Quiescent on current medication regimen, status post amiodarone therapy initiation, currently at 400 mg daily. Status post ICD implantation, with no interim shock. Baseline LFTs and TSH levels normal, prior to initiation of therapy. Will order baseline PFTs. Patient to followup with Dr. Johney Frame in the next 2 months, as scheduled, for ICD followup.

## 2011-11-18 ENCOUNTER — Encounter (HOSPITAL_COMMUNITY): Payer: PRIVATE HEALTH INSURANCE

## 2011-11-27 ENCOUNTER — Ambulatory Visit (HOSPITAL_COMMUNITY)
Admission: RE | Admit: 2011-11-27 | Discharge: 2011-11-27 | Disposition: A | Discharge status: Home / Self Care | Payer: PRIVATE HEALTH INSURANCE | Source: Ambulatory Visit | Attending: Physician Assistant | Admitting: Physician Assistant

## 2011-11-27 DIAGNOSIS — R0989 Other specified symptoms and signs involving the circulatory and respiratory systems: Secondary | ICD-10-CM | POA: Insufficient documentation

## 2011-11-27 DIAGNOSIS — I129 Hypertensive chronic kidney disease with stage 1 through stage 4 chronic kidney disease, or unspecified chronic kidney disease: Secondary | ICD-10-CM | POA: Insufficient documentation

## 2011-11-27 DIAGNOSIS — R0609 Other forms of dyspnea: Secondary | ICD-10-CM | POA: Insufficient documentation

## 2011-11-27 DIAGNOSIS — I428 Other cardiomyopathies: Secondary | ICD-10-CM | POA: Insufficient documentation

## 2011-11-27 DIAGNOSIS — Z79899 Other long term (current) drug therapy: Secondary | ICD-10-CM | POA: Insufficient documentation

## 2011-11-27 DIAGNOSIS — N182 Chronic kidney disease, stage 2 (mild): Secondary | ICD-10-CM | POA: Insufficient documentation

## 2011-11-27 MED ORDER — ALBUTEROL SULFATE (5 MG/ML) 0.5% IN NEBU
2.5000 mg | INHALATION_SOLUTION | Freq: Once | RESPIRATORY_TRACT | Status: AC
  Administered 2011-11-27: 2.5 mg via RESPIRATORY_TRACT

## 2011-11-27 NOTE — Procedures (Signed)
NAMEACELIN, FERDIG             ACCOUNT NO.:  [id number]  MEDICAL RECORD NO.:  [id number]  LOCATION:  RESP                          FACILITY:  [facility]  PHYSICIAN:  Leilynn Pilat L. Juanetta Gosling, M.D.DATE OF BIRTH:  02-22-1940  DATE OF PROCEDURE: DATE OF DISCHARGE:                           PULMONARY FUNCTION TEST   Reason for pulmonary function testing is history of amiodarone therapy. 1. Spirometry shows a mild ventilatory defect without definite airflow     obstruction. 2. Lung volumes show mild reduction in total lung capacity. 3. DLCO is moderately reduced. 4. Airway resistance is elevated which may indicate airflow     obstruction. 5. There is no significant bronchodilator improvement. 6. This maybe related to restrictive pulmonary disease related to     amiodarone therapy and clinical correlation is suggested.     Miesha Bachmann L. Juanetta Gosling, M.D.     ELH/MEDQ  D:  11/27/2011  T:  11/27/2011  Job:  [id number]  cc:   Rozell Searing, PA-C

## 2011-12-02 LAB — PULMONARY FUNCTION TEST

## 2011-12-15 ENCOUNTER — Encounter: Payer: Self-pay | Admitting: Cardiology

## 2011-12-16 ENCOUNTER — Encounter: Payer: Self-pay | Admitting: Cardiology

## 2011-12-16 ENCOUNTER — Ambulatory Visit (INDEPENDENT_AMBULATORY_CARE_PROVIDER_SITE_OTHER): Payer: PRIVATE HEALTH INSURANCE | Admitting: Cardiology

## 2011-12-16 VITALS — BP 113/72 | HR 70 | Ht 72.0 in | Wt 334.0 lb

## 2011-12-16 DIAGNOSIS — I428 Other cardiomyopathies: Secondary | ICD-10-CM

## 2011-12-16 DIAGNOSIS — I10 Essential (primary) hypertension: Secondary | ICD-10-CM

## 2011-12-16 DIAGNOSIS — I472 Ventricular tachycardia: Secondary | ICD-10-CM

## 2011-12-16 DIAGNOSIS — G4733 Obstructive sleep apnea (adult) (pediatric): Secondary | ICD-10-CM

## 2011-12-16 DIAGNOSIS — I429 Cardiomyopathy, unspecified: Secondary | ICD-10-CM

## 2011-12-16 MED ORDER — AMIODARONE HCL 200 MG PO TABS: 200.0000 mg | ORAL_TABLET | Freq: Every day | ORAL | Status: DC

## 2011-12-16 NOTE — Assessment & Plan Note (Signed)
Clinically stable at this point on medical therapy. LVEF ranging 35-45% depending on testing modality at time of original diagnosis. He is now status post ICD in the setting of VT. Plan will be to continue medical therapy, and arrange followup echocardiogram for his next visit.

## 2011-12-16 NOTE — Assessment & Plan Note (Signed)
Blood pressure is very well controlled today. 

## 2011-12-16 NOTE — Patient Instructions (Addendum)
Your physician recommends that you schedule a follow-up appointment in: 3 months. Your physician has recommended you make the following change in your medication: Decrease your amiodarone to 200 mg daily. You may break your 400 mg tablet in 1/2 daily until finished. Your new prescription has been sent to your pharmacy. All other medications will remain the same. Your physician has requested that you have an echocardiogram in 3 months just before your next visit. Echocardiography is a painless test that uses sound waves to create images of your heart. It provides your doctor with information about the size and shape of your heart and how well your heart's chambers and valves are working. This procedure takes approximately one hour. There are no restrictions for this procedure.

## 2011-12-16 NOTE — Assessment & Plan Note (Signed)
Now reports compliance with CPAP.

## 2011-12-16 NOTE — Progress Notes (Signed)
Clinical Summary [name] is a 71 y.o.male presenting for followup. He is a former patient of Dr. Andee Lineman, prefers to stay with [name]. He was seen by Mr. Serpe in late October. He remains symptomatically stable, NYHA class II-III dyspnea on exertion, no palpitations or chest pain. No device discharges.  Lab work from 10/16 reviewed finding potassium 4.8, BUN 32, creatinine unreadable on faxed form, BNP 23. Creatinine on 10/8 was 1.3. PFTs were also obtained demonstrating FEV1 2.45, FVC 3.1 and FEV1/FVC ratio normal with no significant bronchodilator response and moderately reduced diffusion capacity with DLCO 59%.  Medications were reviewed. He has pending device followup with Dr. Johney Frame.   Allergies  Allergen Reactions  . Codeine Other (See Comments)    Bones ache  . Milk-Related Compounds   . Warfarin Sodium     REACTION: possible Coumadin induced necrosis    Current Outpatient Prescriptions  Medication Sig Dispense Refill  . allopurinol (ZYLOPRIM) 100 MG tablet Take 2 tablets (200 mg total) by mouth daily.  30 tablet  0  . amiodarone (PACERONE) 200 MG tablet Take 1 tablet (200 mg total) by mouth daily.  30 tablet  3  . amLODipine (NORVASC) 10 MG tablet TAKE 1 TABLET ONCE DAILY  30 tablet  6  . aspirin (ASPIR-81) 81 MG EC tablet Take 81 mg by mouth daily.        . carvedilol (COREG) 25 MG tablet Take 25 mg by mouth 2 (two) times daily with a meal.       . Cetirizine HCl 10 MG CAPS Take 10 mg by mouth daily.       . colchicine (COLCRYS) 0.6 MG tablet Take 0.6 mg by mouth daily as needed. For gout pain      . DULoxetine (CYMBALTA) 30 MG capsule Take 30 mg by mouth daily.        Marland Kitchen dutasteride (AVODART) 0.5 MG capsule Take 0.5 mg by mouth daily.       . finasteride (PROSCAR) 5 MG tablet Take 5 mg by mouth daily.      . fluticasone (FLONASE) 50 MCG/ACT nasal spray Place 2 sprays into the nose daily as needed. For dry nose      . furosemide (LASIX) 40 MG tablet Take 40 mg by mouth  every morning.        Marland Kitchen HYDROcodone-acetaminophen (VICODIN) 5-500 MG per tablet Take 1 tablet by mouth daily as needed. For pain      . lisinopril (PRINIVIL,ZESTRIL) 40 MG tablet Take 40 mg by mouth daily.        . pravastatin (PRAVACHOL) 40 MG tablet Take 40 mg by mouth daily.          Past Medical History  Diagnosis Date  . Nonischemic dilated cardiomyopathy     EF 35-40%, Cath 10/4 showed normal coronaries, EF 40-45%  . CKD (chronic kidney disease), stage II   . Essential hypertension, benign   . Obstructive sleep apnea   . Dyslipidemia   . Type 2 diabetes mellitus   . DVT of upper extremity (deep vein thrombosis) 2012    Right arm  . Depression   . Arthritis   . Gout     Right foot  . Ventricular tachycardia     s/p Medtronic single chamber ICD 10/27/11    Past Surgical History  Procedure Date  . Bunionectomy     Right foot  . Carpal tunnel release     Right hand  . Joint replacement   .  Total hip arthroplasty     Left  . Total knee arthroplasty     Right  . Appendectomy 1961  . Tonsillectomy and adenoidectomy ~ 1950  . Cataract extraction w/ intraocular lens  implant, bilateral     Social History [name] reports that he quit smoking about 29 years ago. His smoking use included Cigarettes. He has a 15 pack-year smoking history. He does not have any smokeless tobacco history on file. [name] reports that he does not drink alcohol.  Review of Systems Negative except as outlined.  Physical Examination Filed Vitals:   12/16/11 1046  BP: 113/72  Pulse: 70   Filed Weights   12/16/11 1046  Weight: 334 lb (151.501 kg)   Morbidly obese male in no acute distress. HEENT: Conjunctiva and lids normal, oropharynx clear. Neck: Supple, no obvious elevated JVP with increased girth, no carotid bruits, no thyromegaly. Lungs: Clear to auscultation, diminished throughout, nonlabored breathing at rest. Cardiac: Regular rate and rhythm, no S3, distant heart sounds, no  pericardial rub. Abdomen: Soft, nontender, bowel sounds present. Extremities: No pitting edema, distal pulses 2+. Skin: Warm and dry. Musculoskeletal: No kyphosis. Neuropsychiatric: Alert and oriented x3, affect grossly appropriate.   Problem List and Plan   CARDIOMYOPATHY, DILATED Clinically stable at this point on medical therapy. LVEF ranging 35-45% depending on testing modality at time of original diagnosis. He is now status post ICD in the setting of VT. Plan will be to continue medical therapy, and arrange followup echocardiogram for his next visit.  Ventricular tachycardia Status post Medtronic ICD. Keep followup with Dr. Johney Frame. For now, will reduce amiodarone from 400 mg daily to 200 mg daily, particularly with DLCO 59% by PFTs. Recent TSH and LFTs normal.  OBSTRUCTIVE SLEEP APNEA Now reports compliance with CPAP.  Essential hypertension, benign Blood pressure is very well controlled today.    [name], M.D., F.A.C.C.

## 2011-12-16 NOTE — Assessment & Plan Note (Signed)
Status post Medtronic ICD. Keep followup with Dr. Johney Frame. For now, will reduce amiodarone from 400 mg daily to 200 mg daily, particularly with DLCO 59% by PFTs. Recent TSH and LFTs normal.

## 2012-02-02 ENCOUNTER — Encounter: Payer: PRIVATE HEALTH INSURANCE | Admitting: Internal Medicine

## 2012-02-02 ENCOUNTER — Ambulatory Visit: Payer: Medicare Other | Admitting: Internal Medicine

## 2012-02-18 ENCOUNTER — Encounter: Payer: PRIVATE HEALTH INSURANCE | Admitting: Internal Medicine

## 2012-02-26 ENCOUNTER — Encounter: Payer: Self-pay | Admitting: Internal Medicine

## 2012-03-01 ENCOUNTER — Ambulatory Visit (INDEPENDENT_AMBULATORY_CARE_PROVIDER_SITE_OTHER): Payer: PRIVATE HEALTH INSURANCE | Admitting: Internal Medicine

## 2012-03-01 ENCOUNTER — Encounter: Payer: Self-pay | Admitting: Internal Medicine

## 2012-03-01 VITALS — BP 116/73 | HR 71 | Ht 72.0 in | Wt 334.0 lb

## 2012-03-01 DIAGNOSIS — I428 Other cardiomyopathies: Secondary | ICD-10-CM

## 2012-03-01 DIAGNOSIS — I472 Ventricular tachycardia: Secondary | ICD-10-CM

## 2012-03-01 DIAGNOSIS — Z95810 Presence of automatic (implantable) cardiac defibrillator: Secondary | ICD-10-CM

## 2012-03-01 DIAGNOSIS — I10 Essential (primary) hypertension: Secondary | ICD-10-CM

## 2012-03-01 DIAGNOSIS — I5022 Chronic systolic (congestive) heart failure: Secondary | ICD-10-CM

## 2012-03-01 LAB — ICD DEVICE OBSERVATION
CHARGE TIME: 4.404 s
RV LEAD AMPLITUDE: 1.6 mv
RV LEAD IMPEDENCE ICD: 532 Ohm
RV LEAD THRESHOLD: 0.75 V
TOT-0001: 1
TOT-0006: 20131007000000
TZAT-0001FASTVT: 1
TZAT-0001SLOWVT: 1
TZAT-0001SLOWVT: 2
TZAT-0004SLOWVT: 8
TZAT-0005SLOWVT: 88 pct
TZAT-0005SLOWVT: 91 pct
TZAT-0018SLOWVT: NEGATIVE
TZAT-0018SLOWVT: NEGATIVE
TZAT-0020FASTVT: 1.5 ms
TZAT-0020SLOWVT: 1.5 ms
TZAT-0020SLOWVT: 1.5 ms
TZON-0003SLOWVT: 300 ms
TZON-0003VSLOWVT: 400 ms
TZON-0004SLOWVT: 36
TZON-0004VSLOWVT: 40
TZST-0001FASTVT: 2
TZST-0001FASTVT: 3
TZST-0001FASTVT: 4
TZST-0001SLOWVT: 3
TZST-0001SLOWVT: 5
TZST-0001SLOWVT: 6
TZST-0002FASTVT: NEGATIVE
TZST-0002FASTVT: NEGATIVE
TZST-0002FASTVT: NEGATIVE
TZST-0003SLOWVT: 35 J
VENTRICULAR PACING ICD: 0.01 pct
VF: 0

## 2012-03-01 MED ORDER — AMLODIPINE BESYLATE 10 MG PO TABS: 10.0000 mg | ORAL_TABLET | Freq: Every day | ORAL | Status: DC

## 2012-03-01 NOTE — Progress Notes (Signed)
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  Since having his ICD implanted, the patient reports doing very well.  Today, he denies symptoms of palpitations, chest pain, shortness of breath,  lower extremity edema, dizziness, presyncope, syncope, or ICD shocks.  The patient is otherwise without complaint today.   Past Medical History  Diagnosis Date  . Nonischemic dilated cardiomyopathy     EF 35-40%, Cath 10/4 showed normal coronaries, EF 40-45%  . CKD (chronic kidney disease), stage II   . Essential hypertension, benign   . Obstructive sleep apnea   . Dyslipidemia   . Type 2 diabetes mellitus   . DVT of upper extremity (deep vein thrombosis) 2012    Right arm  . Depression   . Arthritis   . Gout     Right foot  . Ventricular tachycardia     s/p Medtronic single chamber ICD 10/27/11   Past Surgical History  Procedure Laterality Date  . Bunionectomy      Right foot  . Carpal tunnel release      Right hand  . Joint replacement    . Total hip arthroplasty      Left  . Total knee arthroplasty      Right  . Appendectomy  1961  . Tonsillectomy and adenoidectomy  ~ 1950  . Cataract extraction w/ intraocular lens  implant, bilateral    . Cardiac defibrillator placement  10/27/11    MDT Protecta XT VR ICD implanted by Dr Graciela Husbands for VT    Current Outpatient Prescriptions  Medication Sig Dispense Refill  . allopurinol (ZYLOPRIM) 100 MG tablet Take 2 tablets (200 mg total) by mouth daily.  30 tablet  0  . amiodarone (PACERONE) 200 MG tablet Take 1 tablet (200 mg total) by mouth daily.  30 tablet  3  . amLODipine (NORVASC) 10 MG tablet Take 1 tablet (10 mg total) by mouth daily.  30 tablet  6  . aspirin (ASPIR-81) 81 MG EC tablet Take 81 mg by mouth daily.        . carvedilol (COREG) 25 MG tablet Take 25 mg by mouth 2 (two) times daily with a meal.       . Cetirizine HCl 10 MG  CAPS Take 10 mg by mouth daily.       . colchicine (COLCRYS) 0.6 MG tablet Take 0.6 mg by mouth daily as needed. For gout pain      . DULoxetine (CYMBALTA) 30 MG capsule Take 30 mg by mouth daily.        Marland Kitchen dutasteride (AVODART) 0.5 MG capsule Take 0.5 mg by mouth daily.       . finasteride (PROSCAR) 5 MG tablet Take 5 mg by mouth daily.      . fluticasone (FLONASE) 50 MCG/ACT nasal spray Place 2 sprays into the nose daily as needed. For dry nose      . furosemide (LASIX) 40 MG tablet Take 40 mg by mouth every morning.        Marland Kitchen HYDROcodone-acetaminophen (VICODIN) 5-500 MG per tablet Take 1 tablet by mouth daily as needed. For pain      . lisinopril (PRINIVIL,ZESTRIL) 40 MG tablet Take 40 mg by mouth daily.        . pravastatin (PRAVACHOL) 40 MG tablet Take 40 mg by mouth daily.         No current facility-administered medications for this  visit.    Physical Exam: Filed Vitals:   03/01/12 1347  BP: 116/73  Pulse: 71  Height: 6' (1.829 m)  Weight: 334 lb (151.501 kg)    GEN- The patient is obese appearing, alert and oriented x 3 today.   Head- normocephalic, atraumatic Eyes-  Sclera clear, conjunctiva pink Ears- hearing intact Oropharynx- clear Lungs- Clear to ausculation bilaterally, normal work of breathing Chest- ICD pocket is well healed Heart- Regular rate and rhythm, no murmurs, rubs or gallops, PMI not laterally displaced GI- soft, NT, ND, + BS Extremities- no clubbing, cyanosis, or edema  ICD interrogation- reviewed in detail today,  See PACEART report  Assessment and Plan:  1.  VT Normal ICD function See Arita Miss Art report NID increased today to minimize risk of inappropriate ICD shocks VT is well controlled   2. Nonischemic cardiomyopathy Stable volume status No changes  3. HTN Stable No change required today, refill amlodipine today  If VT remains quiescent, we may be able to decrease amiodarone to 100mg  daily upon return in 9 months

## 2012-03-01 NOTE — Patient Instructions (Signed)
   Norvasc refill sent to pharmacy  [facility] remote check 05/31/2012  [facility] - October Continue all current medications.

## 2012-03-17 ENCOUNTER — Other Ambulatory Visit (INDEPENDENT_AMBULATORY_CARE_PROVIDER_SITE_OTHER): Payer: PRIVATE HEALTH INSURANCE

## 2012-03-17 ENCOUNTER — Other Ambulatory Visit: Payer: Self-pay

## 2012-03-17 DIAGNOSIS — I429 Cardiomyopathy, unspecified: Secondary | ICD-10-CM

## 2012-03-17 DIAGNOSIS — I472 Ventricular tachycardia, unspecified: Secondary | ICD-10-CM

## 2012-03-17 DIAGNOSIS — I428 Other cardiomyopathies: Secondary | ICD-10-CM

## 2012-03-17 DIAGNOSIS — I5022 Chronic systolic (congestive) heart failure: Secondary | ICD-10-CM

## 2012-03-19 ENCOUNTER — Ambulatory Visit (INDEPENDENT_AMBULATORY_CARE_PROVIDER_SITE_OTHER): Payer: PRIVATE HEALTH INSURANCE | Admitting: Cardiology

## 2012-03-19 ENCOUNTER — Encounter: Payer: Self-pay | Admitting: Cardiology

## 2012-03-19 VITALS — BP 102/61 | HR 65 | Ht 72.0 in | Wt 331.1 lb

## 2012-03-19 DIAGNOSIS — I472 Ventricular tachycardia, unspecified: Secondary | ICD-10-CM

## 2012-03-19 DIAGNOSIS — I5022 Chronic systolic (congestive) heart failure: Secondary | ICD-10-CM

## 2012-03-19 DIAGNOSIS — Z95810 Presence of automatic (implantable) cardiac defibrillator: Secondary | ICD-10-CM

## 2012-03-19 DIAGNOSIS — I428 Other cardiomyopathies: Secondary | ICD-10-CM

## 2012-03-19 NOTE — Patient Instructions (Addendum)

## 2012-03-19 NOTE — Assessment & Plan Note (Signed)
Clinically stable on current medical regimen. No changes made today.

## 2012-03-19 NOTE — Progress Notes (Signed)
Clinical Summary [name] is a 72 y.o.male presenting for followup. I last saw him in November 2013. He recently saw Dr. Johney Frame for ICD check, VT felt to be well controlled at that time on amiodarone.  Recent followup echocardiogram shows LVEF 40-45% with diffuse hypokinesis, most prominent in the basal inferoposterior wall, grade 1 diastolic dysfunction, mild mitral regurgitation, mild to moderate left atrial enlargement, device wire in right heart, PASP 26 mmHg. Lab work from October 2013 showed a BNP level of only 23. We reviewed these today.  Other than having some recent "sinus trouble," he states that he has been feeling well. NYHA class II dyspnea, no palpitations or device shocks.   Allergies  Allergen Reactions  . Codeine Other (See Comments)    Bones ache  . Milk-Related Compounds   . Warfarin Sodium     REACTION: possible Coumadin induced necrosis    Current Outpatient Prescriptions  Medication Sig Dispense Refill  . amiodarone (PACERONE) 200 MG tablet Take 1 tablet (200 mg total) by mouth daily.  30 tablet  3  . amLODipine (NORVASC) 10 MG tablet Take 1 tablet (10 mg total) by mouth daily.  30 tablet  6  . aspirin (ASPIR-81) 81 MG EC tablet Take 81 mg by mouth daily.        . carvedilol (COREG) 25 MG tablet Take 25 mg by mouth 2 (two) times daily with a meal.       . Cetirizine HCl 10 MG CAPS Take 10 mg by mouth daily.       . DULoxetine (CYMBALTA) 30 MG capsule Take 30 mg by mouth daily.        Marland Kitchen dutasteride (AVODART) 0.5 MG capsule Take 0.5 mg by mouth daily.       . furosemide (LASIX) 40 MG tablet Take 40 mg by mouth every morning.        Marland Kitchen HYDROcodone-acetaminophen (VICODIN) 5-500 MG per tablet Take 1 tablet by mouth daily as needed. For pain      . lisinopril (PRINIVIL,ZESTRIL) 40 MG tablet Take 40 mg by mouth daily.        . pravastatin (PRAVACHOL) 40 MG tablet Take 40 mg by mouth daily.        Marland Kitchen allopurinol (ZYLOPRIM) 100 MG tablet Take 2 tablets (200 mg total)  by mouth daily.  30 tablet  0  . colchicine (COLCRYS) 0.6 MG tablet Take 0.6 mg by mouth daily as needed. For gout pain      . fluticasone (FLONASE) 50 MCG/ACT nasal spray Place 2 sprays into the nose daily as needed. For dry nose       No current facility-administered medications for this visit.    Past Medical History  Diagnosis Date  . Nonischemic dilated cardiomyopathy     EF 35-40%, Cath 10/4 showed normal coronaries, EF 40-45%  . CKD (chronic kidney disease), stage II   . Essential hypertension, benign   . Obstructive sleep apnea   . Dyslipidemia   . Type 2 diabetes mellitus   . DVT of upper extremity (deep vein thrombosis) 2012    Right arm  . Depression   . Arthritis   . Gout     Right foot  . Ventricular tachycardia     s/p Medtronic single chamber ICD 10/27/11    Social History Mr. Semmel reports that he quit smoking about 30 years ago. His smoking use included Cigarettes. He has a 15 pack-year smoking history. He does not have any smokeless  tobacco history on file. Mr. Kunath reports that he does not drink alcohol.  Review of Systems Negative except as outlined.  Physical Examination Filed Vitals:   03/19/12 0934  BP: 102/61  Pulse: 65   Filed Weights   03/19/12 0934  Weight: 331 lb 1.9 oz (150.195 kg)    Morbidly obese male in no acute distress.  HEENT: Conjunctiva and lids normal, oropharynx clear.  Neck: Supple, no obvious elevated JVP with increased girth, no carotid bruits, no thyromegaly.  Lungs: Clear to auscultation, diminished throughout, nonlabored breathing at rest.  Thorax: Stable device pocket site. Cardiac: Regular rate and rhythm, no S3, distant heart sounds, no pericardial rub.  Abdomen: Soft, nontender, bowel sounds present.  Extremities: No pitting edema, distal pulses 2+.     Problem List and Plan   Chronic systolic heart failure Clinically stable on current medical regimen. No changes made today.  CARDIOMYOPATHY, DILATED LVEF  40-45% by recent followup echocardiogram. Continue medical therapy.  ICD-Medtronic No reported device discharges. Keep followup with Dr. Johney Frame.  Ventricular tachycardia Continues on amiodarone per Dr. Johney Frame, may be able to decrease dose ultimately.    Jonelle Sidle, M.D., F.A.C.C.

## 2012-03-19 NOTE — Assessment & Plan Note (Signed)
LVEF 40-45% by recent followup echocardiogram. Continue medical therapy.

## 2012-03-19 NOTE — Assessment & Plan Note (Signed)
Continues on amiodarone per Dr. Johney Frame, may be able to decrease dose ultimately.

## 2012-03-19 NOTE — Assessment & Plan Note (Signed)
No reported device discharges. Keep followup with Dr. Johney Frame.

## 2012-03-31 ENCOUNTER — Encounter: Payer: PRIVATE HEALTH INSURANCE | Admitting: Internal Medicine

## 2012-05-31 ENCOUNTER — Encounter: Payer: PRIVATE HEALTH INSURANCE | Admitting: *Deleted

## 2012-06-11 ENCOUNTER — Encounter: Payer: Self-pay | Admitting: *Deleted

## 2012-06-23 ENCOUNTER — Encounter: Payer: PRIVATE HEALTH INSURANCE | Admitting: *Deleted

## 2012-09-30 ENCOUNTER — Other Ambulatory Visit: Payer: Self-pay | Admitting: Internal Medicine

## 2012-12-30 ENCOUNTER — Encounter: Payer: Self-pay | Admitting: Internal Medicine

## 2012-12-30 ENCOUNTER — Ambulatory Visit (INDEPENDENT_AMBULATORY_CARE_PROVIDER_SITE_OTHER): Payer: PRIVATE HEALTH INSURANCE | Admitting: Internal Medicine

## 2012-12-30 VITALS — BP 120/60 | HR 69 | Ht 72.0 in | Wt 322.0 lb

## 2012-12-30 DIAGNOSIS — I472 Ventricular tachycardia, unspecified: Secondary | ICD-10-CM

## 2012-12-30 DIAGNOSIS — I5022 Chronic systolic (congestive) heart failure: Secondary | ICD-10-CM

## 2012-12-30 DIAGNOSIS — I10 Essential (primary) hypertension: Secondary | ICD-10-CM

## 2012-12-30 DIAGNOSIS — I428 Other cardiomyopathies: Secondary | ICD-10-CM

## 2012-12-30 LAB — MDC_IDC_ENUM_SESS_TYPE_INCLINIC
Brady Statistic RV Percent Paced: 0.01 %
HighPow Impedance: 50 Ohm
HighPow Impedance: 60 Ohm
Lead Channel Pacing Threshold Pulse Width: 0.4 ms
Lead Channel Sensing Intrinsic Amplitude: 12 mV
Lead Channel Setting Pacing Pulse Width: 0.4 ms
Lead Channel Setting Sensing Sensitivity: 0.3 mV
Zone Setting Detection Interval: 300 ms

## 2012-12-30 MED ORDER — AMIODARONE HCL 100 MG PO TABS: 100.0000 mg | ORAL_TABLET | Freq: Every day | ORAL | Status: DC

## 2012-12-30 NOTE — Progress Notes (Signed)
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 male who presents today for routine electrophysiology followup.  Since having his last visit, the patient reports doing very well.  He has chronic but stable SOB.  Today, he denies symptoms of palpitations, chest pain, shortness of breath,  lower extremity edema, dizziness, presyncope, syncope, or ICD shocks.  The patient is otherwise without complaint today.   Past Medical History  Diagnosis Date  . Nonischemic dilated cardiomyopathy     EF 35-40%, Cath 10/4 showed normal coronaries, EF 40-45%  . CKD (chronic kidney disease), stage II   . Essential hypertension, benign   . Obstructive sleep apnea   . Dyslipidemia   . Type 2 diabetes mellitus   . DVT of upper extremity (deep vein thrombosis) 2012    Right arm  . Depression   . Arthritis   . Gout     Right foot  . Ventricular tachycardia     s/p Medtronic single chamber ICD 10/27/11   Past Surgical History  Procedure Laterality Date  . Bunionectomy      Right foot  . Carpal tunnel release      Right hand  . Joint replacement    . Total hip arthroplasty      Left  . Total knee arthroplasty      Right  . Appendectomy  1961  . Tonsillectomy and adenoidectomy  ~ 1950  . Cataract extraction w/ intraocular lens  implant, bilateral    . Cardiac defibrillator placement  10/27/11    MDT Protecta XT VR ICD implanted by Dr Graciela Husbands for VT    Current Outpatient Prescriptions  Medication Sig Dispense Refill  . allopurinol (ZYLOPRIM) 100 MG tablet Take 2 tablets (200 mg total) by mouth daily.  30 tablet  0  . amiodarone (PACERONE) 100 MG tablet Take 1 tablet (100 mg total) by mouth daily.  30 tablet  3  . amLODipine (NORVASC) 10 MG tablet TAKE ONE TABLET BY MOUTH ONCE DAILY  30 tablet  3  . aspirin (ASPIR-81) 81 MG EC tablet Take 81 mg by mouth daily.        . carvedilol (COREG) 25 MG tablet Take 25 mg by mouth 2 (two) times daily with a meal.       .  Cetirizine HCl 10 MG CAPS Take 10 mg by mouth daily.       . colchicine (COLCRYS) 0.6 MG tablet Take 0.6 mg by mouth daily as needed. For gout pain      . DULoxetine (CYMBALTA) 30 MG capsule Take 30 mg by mouth daily.        Marland Kitchen dutasteride (AVODART) 0.5 MG capsule Take 0.5 mg by mouth daily.       . furosemide (LASIX) 40 MG tablet Take 40 mg by mouth every morning.        Marland Kitchen HYDROcodone-acetaminophen (NORCO/VICODIN) 5-325 MG per tablet Take 1 tablet by mouth 2 (two) times daily.      Marland Kitchen lisinopril (PRINIVIL,ZESTRIL) 40 MG tablet Take 40 mg by mouth daily.        . pravastatin (PRAVACHOL) 40 MG tablet Take 40 mg by mouth daily.        . fluticasone (FLONASE) 50 MCG/ACT nasal spray Place 2 sprays into the nose daily as needed. For dry nose       No current facility-administered medications for this visit.    Physical Exam: Filed Vitals:   12/30/12 1610  BP: 120/60  Pulse: 69  Height: 6' (1.829 m)  Weight: 322 lb (146.058 kg)  SpO2: 97%    GEN- The patient is obese appearing, alert and oriented x 3 today.   Head- normocephalic, atraumatic Eyes-  Sclera clear, conjunctiva pink Ears- hearing intact Oropharynx- clear Lungs- Clear to ausculation bilaterally, normal work of breathing Chest- ICD pocket is well healed Heart- Regular rate and rhythm, no murmurs, rubs or gallops, PMI not laterally displaced GI- soft, NT, ND, + BS Extremities- no clubbing, cyanosis, or edema  ICD interrogation- reviewed in detail today,  See PACEART report  Assessment and Plan:  1.  VT Normal ICD function See [facility] report VT is well controlled  Decrease amiodarone to 100mg  daily  2. Nonischemic cardiomyopathy Stable volume status Pt will be enrolled in the [facility] ICM clinic No changes  3. HTN Stable No change required today   I have recommended LFTs/TFTs today.  He would prefer to have these done by PCP. I will ask Dr Sherryll Burger to forward results to me when available  [facility] Return to see me  in 1 year

## 2012-12-30 NOTE — Patient Instructions (Signed)
Your physician recommends that you schedule a follow-up appointment in: 1 year with Dr. Johney Frame. You should receive a letter in the mail in 10 months. If you do not receive this letter by October 2015 call our office to schedule this appointment.   Home transmission April 04, 2013  Your physician has recommended you make the following change in your medication:  Decrease Amiodrone 100 MG once daily  Continue all other medications the same.

## 2013-01-12 ENCOUNTER — Encounter: Payer: Self-pay | Admitting: Internal Medicine

## 2013-01-26 ENCOUNTER — Other Ambulatory Visit: Payer: Self-pay | Admitting: Internal Medicine

## 2013-01-28 ENCOUNTER — Telehealth: Payer: Self-pay | Admitting: Internal Medicine

## 2013-01-28 NOTE — Telephone Encounter (Signed)
New message     Have remote transmission check scheduled for Monday---need to know how to use the transmission box

## 2013-01-28 NOTE — Telephone Encounter (Signed)
Answered all questions in regards to sending transmission.

## 2013-01-31 ENCOUNTER — Ambulatory Visit (INDEPENDENT_AMBULATORY_CARE_PROVIDER_SITE_OTHER): Payer: PRIVATE HEALTH INSURANCE | Admitting: *Deleted

## 2013-01-31 DIAGNOSIS — Z95810 Presence of automatic (implantable) cardiac defibrillator: Secondary | ICD-10-CM

## 2013-01-31 DIAGNOSIS — I5022 Chronic systolic (congestive) heart failure: Secondary | ICD-10-CM

## 2013-01-31 NOTE — Progress Notes (Signed)
EPIC Encounter for ICM Monitoring  Patient Name: [name] is a 73 y.o. male Date: 01/31/2013 Primary Care Physican: Monico Blitz, MD Primary Cardiologist: Domenic Polite Electrophysiologist: Allred Dry Weight: 320 lbs       In the past month, have you:  1. Gained more than 2 pounds in a day or more than 5 pounds in a week? no  2. Had changes in your medications (with verification of current medications)? no  3. Had more shortness of breath than is usual for you? no  4. Limited your activity because of shortness of breath? no  5. Not been able to sleep because of shortness of breath? no  6. Had increased swelling in your feet or ankles? no  7. Had symptoms of dehydration (dizziness, dry mouth, increased thirst, decreased urine output) no  8. Had changes in sodium restriction? no  9. Been compliant with medication? Yes   ICM trend:   Follow-up plan: ICM clinic phone appointment: 03/07/13  Copy of note sent to patient's primary care physician, primary cardiologist, and device following physician.  Alvis Lemmings, RN, BSN 01/31/2013 10:41 AM

## 2013-03-07 ENCOUNTER — Ambulatory Visit (INDEPENDENT_AMBULATORY_CARE_PROVIDER_SITE_OTHER): Payer: PRIVATE HEALTH INSURANCE | Admitting: *Deleted

## 2013-03-07 DIAGNOSIS — Z95810 Presence of automatic (implantable) cardiac defibrillator: Secondary | ICD-10-CM

## 2013-03-07 DIAGNOSIS — I5022 Chronic systolic (congestive) heart failure: Secondary | ICD-10-CM

## 2013-03-07 NOTE — Progress Notes (Signed)
EPIC Encounter for ICM Monitoring  Patient Name: [name] is a 73 y.o. male Date: 03/07/2013 Primary Care Physican: Monico Blitz, MD Primary Cardiologist: Domenic Polite Electrophysiologist: Allred Dry Weight: 320 lbs       In the past month, have you:  1. Gained more than 2 pounds in a day or more than 5 pounds in a week? no  2. Had changes in your medications (with verification of current medications)? no  3. Had more shortness of breath than is usual for you? no  4. Limited your activity because of shortness of breath? no  5. Not been able to sleep because of shortness of breath? no  6. Had increased swelling in your feet or ankles? no  7. Had symptoms of dehydration (dizziness, dry mouth, increased thirst, decreased urine output) no  8. Had changes in sodium restriction? no  9. Been compliant with medication? Yes   ICM trend:   Follow-up plan: ICM clinic phone appointment: 04/07/13  (The patient reports that he is pending lab work at Dr. Trena Platt office. He will have this forwarded to [place]).   Copy of note sent to patient's primary care physician, primary cardiologist, and device following physician.  Alvis Lemmings, RN, BSN 03/07/2013 10:58 AM

## 2013-03-08 ENCOUNTER — Encounter: Payer: Self-pay | Admitting: Cardiology

## 2013-03-08 ENCOUNTER — Encounter: Payer: Self-pay | Admitting: Cardiovascular Disease

## 2013-04-07 ENCOUNTER — Encounter: Payer: Self-pay | Admitting: *Deleted

## 2013-04-07 ENCOUNTER — Ambulatory Visit (INDEPENDENT_AMBULATORY_CARE_PROVIDER_SITE_OTHER): Payer: PRIVATE HEALTH INSURANCE | Admitting: *Deleted

## 2013-04-07 DIAGNOSIS — I5022 Chronic systolic (congestive) heart failure: Secondary | ICD-10-CM

## 2013-04-07 DIAGNOSIS — I428 Other cardiomyopathies: Secondary | ICD-10-CM

## 2013-04-07 DIAGNOSIS — Z95810 Presence of automatic (implantable) cardiac defibrillator: Secondary | ICD-10-CM

## 2013-04-07 NOTE — Progress Notes (Signed)
EPIC Encounter for ICM Monitoring  Patient Name: [name] is a 73 y.o. male Date: 04/07/2013 Primary Care Physican: Monico Blitz, MD Primary Cardiologist: Domenic Polite Electrophysiologist: Allred Dry Weight: 321 lbs       In the past month, have you:  1. Gained more than 2 pounds in a day or more than 5 pounds in a week? No. The patient states his weights can range from 321 lbs - 325 lbs.  2. Had changes in your medications (with verification of current medications)? no  3. Had more shortness of breath than is usual for you? No. The patient has SOB typically, but it is no different from his normal.  4. Limited your activity because of shortness of breath? no  5. Not been able to sleep because of shortness of breath? no  6. Had increased swelling in your feet or ankles? no  7. Had symptoms of dehydration (dizziness, dry mouth, increased thirst, decreased urine output) no  8. Had changes in sodium restriction? no  9. Been compliant with medication? Yes   ICM trend:   Follow-up plan: ICM clinic phone appointment: 05/09/13  Copy of note sent to patient's primary care physician, primary cardiologist, and device following physician.  Alvis Lemmings, RN, BSN 04/07/2013 11:45 AM

## 2013-04-08 LAB — MDC_IDC_ENUM_SESS_TYPE_REMOTE
Lead Channel Impedance Value: 456 Ohm
Lead Channel Pacing Threshold Amplitude: 0.75 V
Lead Channel Setting Pacing Amplitude: 2 V
Lead Channel Setting Pacing Pulse Width: 0.4 ms
MDC IDC MSMT BATTERY VOLTAGE: 3.19 V
MDC IDC MSMT LEADCHNL RV PACING THRESHOLD PULSEWIDTH: 0.4 ms
MDC IDC MSMT LEADCHNL RV SENSING INTR AMPL: 10.9 mV
MDC IDC SET LEADCHNL RV SENSING SENSITIVITY: 0.3 mV
MDC IDC SET ZONE DETECTION INTERVAL: 300 ms
MDC IDC STAT BRADY RV PERCENT PACED: 1 %
Zone Setting Detection Interval: 250 ms
Zone Setting Detection Interval: 400 ms

## 2013-04-19 NOTE — Progress Notes (Signed)
ICD remote with ICM 

## 2013-04-20 ENCOUNTER — Encounter: Payer: Self-pay | Admitting: *Deleted

## 2013-04-28 ENCOUNTER — Ambulatory Visit (INDEPENDENT_AMBULATORY_CARE_PROVIDER_SITE_OTHER): Payer: PRIVATE HEALTH INSURANCE | Admitting: Cardiology

## 2013-04-28 ENCOUNTER — Encounter: Payer: Self-pay | Admitting: Cardiology

## 2013-04-28 VITALS — BP 112/74 | HR 69 | Ht 72.0 in | Wt 327.4 lb

## 2013-04-28 DIAGNOSIS — I4729 Other ventricular tachycardia: Secondary | ICD-10-CM

## 2013-04-28 DIAGNOSIS — I10 Essential (primary) hypertension: Secondary | ICD-10-CM

## 2013-04-28 DIAGNOSIS — I472 Ventricular tachycardia, unspecified: Secondary | ICD-10-CM

## 2013-04-28 DIAGNOSIS — I5022 Chronic systolic (congestive) heart failure: Secondary | ICD-10-CM

## 2013-04-28 NOTE — Progress Notes (Signed)
Clinical Summary Mr. Deskin is a 73 y.o.male last seen in February 2014. Interval visit with Dr. Rayann Heman noted in December 2014 at which time amiodarone was reduced to 100 mg daily. He has been doing well, no chest pain or device shocks. Stable dyspnea on exertion. Uses a cane to steady himself when he walks.  Lab work from February of this year showed cholesterol 167, triglycerides 1:30, HDL 33, LDL 108, hemoglobin 14.1, normal LFTs.  Echocardiogram from February 2014 showed LVEF 40-45% with diffuse hypokinesis, most prominent in the basal inferoposterior wall, grade 1 diastolic dysfunction, mild mitral regurgitation, mild to moderate left atrial enlargement, device wire in right heart, PASP 26 mmHg.    Allergies  Allergen Reactions  . Codeine Other (See Comments)    Bones ache  . Milk-Related Compounds   . Warfarin Sodium     REACTION: possible Coumadin induced necrosis    Current Outpatient Prescriptions  Medication Sig Dispense Refill  . amiodarone (PACERONE) 200 MG tablet Take 100 mg by mouth daily.      Marland Kitchen amLODipine (NORVASC) 10 MG tablet TAKE ONE TABLET BY MOUTH ONCE DAILY  30 tablet  5  . aspirin (ASPIR-81) 81 MG EC tablet Take 81 mg by mouth daily.        . carvedilol (COREG) 25 MG tablet Take 25 mg by mouth 2 (two) times daily with a meal.       . Cetirizine HCl 10 MG CAPS Take 10 mg by mouth daily.       . DULoxetine (CYMBALTA) 30 MG capsule Take 30 mg by mouth daily.        Marland Kitchen dutasteride (AVODART) 0.5 MG capsule Take 0.5 mg by mouth daily.       . furosemide (LASIX) 40 MG tablet Take 40 mg by mouth every morning.        Marland Kitchen HYDROcodone-acetaminophen (NORCO/VICODIN) 5-325 MG per tablet Take 1 tablet by mouth 2 (two) times daily.      Marland Kitchen lisinopril (PRINIVIL,ZESTRIL) 40 MG tablet Take 40 mg by mouth daily.        . pravastatin (PRAVACHOL) 40 MG tablet Take 40 mg by mouth daily.        Marland Kitchen allopurinol (ZYLOPRIM) 100 MG tablet Take 200 mg by mouth as needed.      . colchicine  (COLCRYS) 0.6 MG tablet Take 0.6 mg by mouth daily as needed. For gout pain      . fluticasone (FLONASE) 50 MCG/ACT nasal spray Place 2 sprays into the nose daily as needed. For dry nose       No current facility-administered medications for this visit.    Past Medical History  Diagnosis Date  . Nonischemic dilated cardiomyopathy     EF 35-40%, Cath 10/4 showed normal coronaries, EF 40-45%  . CKD (chronic kidney disease), stage II   . Essential hypertension, benign   . Obstructive sleep apnea   . Dyslipidemia   . Type 2 diabetes mellitus   . DVT of upper extremity (deep vein thrombosis) 2012    Right arm  . Depression   . Arthritis   . Gout     Right foot  . Ventricular tachycardia     s/p Medtronic single chamber ICD 10/27/11    Social History Mr. Meikle reports that he quit smoking about 31 years ago. His smoking use included Cigarettes. He has a 15 pack-year smoking history. He does not have any smokeless tobacco history on file. Mr. Rozario reports that  he does not drink alcohol.  Review of Systems Negative except as outlined.  Physical Examination Filed Vitals:   04/28/13 1315  BP: 112/74  Pulse: 69   Filed Weights   04/28/13 1315  Weight: 327 lb 6.4 oz (148.508 kg)    Morbidly obese male in no acute distress.  HEENT: Conjunctiva and lids normal, oropharynx clear.  Neck: Supple, no obvious elevated JVP with increased girth, no carotid bruits, no thyromegaly.  Lungs: Clear to auscultation, diminished throughout, nonlabored breathing at rest.  Thorax: Stable device pocket site.  Cardiac: Regular rate and rhythm, no S3, distant heart sounds, no pericardial rub.  Abdomen: Soft, nontender, bowel sounds present.  Extremities: No pitting edema, distal pulses 2+.    Problem List and Plan   Chronic systolic heart failure Symptomatically stable, good blood pressure and heart rate control. LVEF 40-45%.  Ventricular tachycardia Symptomatically stable, Medtronic ICD  in place, followed by Dr. Rayann Heman. Amiodarone at 100 mg daily now.  Essential hypertension, benign Blood pressure stable today.    Satira Sark, M.D., F.A.C.C.

## 2013-04-28 NOTE — Assessment & Plan Note (Signed)
Blood pressure stable today

## 2013-04-28 NOTE — Assessment & Plan Note (Signed)
Symptomatically stable, good blood pressure and heart rate control. LVEF 40-45%.

## 2013-04-28 NOTE — Patient Instructions (Signed)
Your physician recommends that you schedule a follow-up appointment in: 6 months. You will receive a reminder letter in the mail in about 4 months reminding you to call and schedule your appointment. If you don't receive this letter, please contact our office. Your physician recommends that you continue on your current medications as directed. Please refer to the Current Medication list given to you today. Your next device transmission will be 05/09/13.

## 2013-04-28 NOTE — Assessment & Plan Note (Signed)
Symptomatically stable, Medtronic ICD in place, followed by Dr. Rayann Heman. Amiodarone at 100 mg daily now.

## 2013-04-29 ENCOUNTER — Encounter: Payer: Self-pay | Admitting: Internal Medicine

## 2013-05-09 ENCOUNTER — Ambulatory Visit (INDEPENDENT_AMBULATORY_CARE_PROVIDER_SITE_OTHER): Payer: PRIVATE HEALTH INSURANCE | Admitting: *Deleted

## 2013-05-09 ENCOUNTER — Encounter: Payer: Self-pay | Admitting: *Deleted

## 2013-05-09 DIAGNOSIS — Z95810 Presence of automatic (implantable) cardiac defibrillator: Secondary | ICD-10-CM

## 2013-05-09 DIAGNOSIS — I5022 Chronic systolic (congestive) heart failure: Secondary | ICD-10-CM

## 2013-05-09 NOTE — Progress Notes (Signed)
EPIC Encounter for ICM Monitoring  Patient Name: [name] is a 73 y.o. male Date: 05/09/2013 Primary Care Physican: Monico Blitz, MD Primary Cardiologist: Domenic Polite Electrophysiologist: Allred Dry Weight: 322 lbs       In the past month, have you:  1. Gained more than 2 pounds in a day or more than 5 pounds in a week? no  2. Had changes in your medications (with verification of current medications)? no  3. Had more shortness of breath than is usual for you? no  4. Limited your activity because of shortness of breath? no  5. Not been able to sleep because of shortness of breath? no  6. Had increased swelling in your feet or ankles? no  7. Had symptoms of dehydration (dizziness, dry mouth, increased thirst, decreased urine output) no  8. Had changes in sodium restriction? no  9. Been compliant with medication? Yes   ICM trend:   Follow-up plan: ICM clinic phone appointment: 06/09/13.  Copy of note sent to patient's primary care physician, primary cardiologist, and device following physician.  Emily Filbert, RN, BSN 05/09/2013 10:48 AM

## 2013-06-09 ENCOUNTER — Encounter: Payer: Self-pay | Admitting: *Deleted

## 2013-06-09 ENCOUNTER — Ambulatory Visit (INDEPENDENT_AMBULATORY_CARE_PROVIDER_SITE_OTHER): Payer: PRIVATE HEALTH INSURANCE | Admitting: *Deleted

## 2013-06-09 DIAGNOSIS — Z95810 Presence of automatic (implantable) cardiac defibrillator: Secondary | ICD-10-CM

## 2013-06-09 DIAGNOSIS — I5022 Chronic systolic (congestive) heart failure: Secondary | ICD-10-CM

## 2013-06-09 NOTE — Progress Notes (Signed)
EPIC Encounter for ICM Monitoring  Patient Name: [name] is a 73 y.o. male Date: 06/09/2013 Primary Care Physican: Monico Blitz, MD Primary Cardiologist: Domenic Polite Electrophysiologist: Allred Dry Weight: 322 lbs       In the past month, have you:  1. Gained more than 2 pounds in a day or more than 5 pounds in a week? no  2. Had changes in your medications (with verification of current medications)? no  3. Had more shortness of breath than is usual for you? no  4. Limited your activity because of shortness of breath? no  5. Not been able to sleep because of shortness of breath? no  6. Had increased swelling in your feet or ankles? no  7. Had symptoms of dehydration (dizziness, dry mouth, increased thirst, decreased urine output) no  8. Had changes in sodium restriction? no  9. Been compliant with medication? Yes   ICM trend:   Follow-up plan: ICM clinic phone appointment: 07/11/13  Copy of note sent to patient's primary care physician, primary cardiologist, and device following physician.  Emily Filbert, RN, BSN 06/09/2013 12:53 PM

## 2013-07-11 ENCOUNTER — Ambulatory Visit (INDEPENDENT_AMBULATORY_CARE_PROVIDER_SITE_OTHER): Payer: PRIVATE HEALTH INSURANCE | Admitting: *Deleted

## 2013-07-11 ENCOUNTER — Encounter: Payer: Self-pay | Admitting: *Deleted

## 2013-07-11 DIAGNOSIS — I5022 Chronic systolic (congestive) heart failure: Secondary | ICD-10-CM

## 2013-07-11 DIAGNOSIS — I428 Other cardiomyopathies: Secondary | ICD-10-CM

## 2013-07-11 DIAGNOSIS — Z95810 Presence of automatic (implantable) cardiac defibrillator: Secondary | ICD-10-CM

## 2013-07-11 LAB — MDC_IDC_ENUM_SESS_TYPE_REMOTE
Battery Voltage: 3.19 V
Brady Statistic RV Percent Paced: 0.01 %
HIGH POWER IMPEDANCE MEASURED VALUE: 247 Ohm
HighPow Impedance: 304 Ohm
HighPow Impedance: 51 Ohm
HighPow Impedance: 63 Ohm
Lead Channel Impedance Value: 475 Ohm
Lead Channel Pacing Threshold Amplitude: 0.625 V
Lead Channel Sensing Intrinsic Amplitude: 12.375 mV
Lead Channel Setting Pacing Amplitude: 2 V
Lead Channel Setting Pacing Pulse Width: 0.4 ms
Lead Channel Setting Sensing Sensitivity: 0.3 mV
MDC IDC MSMT LEADCHNL RV PACING THRESHOLD PULSEWIDTH: 0.4 ms
MDC IDC MSMT LEADCHNL RV SENSING INTR AMPL: 12.375 mV
MDC IDC SESS DTM: 20150622062508
MDC IDC SET ZONE DETECTION INTERVAL: 250 ms
MDC IDC SET ZONE DETECTION INTERVAL: 300 ms
MDC IDC SET ZONE DETECTION INTERVAL: 400 ms

## 2013-07-11 NOTE — Progress Notes (Signed)
Remote ICD transmission.   

## 2013-07-11 NOTE — Progress Notes (Signed)
EPIC Encounter for ICM Monitoring  Patient Name: [name] is a 73 y.o. male Date: 07/11/2013 Primary Care Physican: Monico Blitz, MD Primary Cardiologist: Domenic Polite Electrophysiologist: Allred Dry Weight: 322 lbs       In the past month, have you:  1. Gained more than 2 pounds in a day or more than 5 pounds in a week? no  2. Had changes in your medications (with verification of current medications)? no  3. Had more shortness of breath than is usual for you? no  4. Limited your activity because of shortness of breath? no  5. Not been able to sleep because of shortness of breath? no  6. Had increased swelling in your feet or ankles? no  7. Had symptoms of dehydration (dizziness, dry mouth, increased thirst, decreased urine output) no  8. Had changes in sodium restriction? no  9. Been compliant with medication? Yes   ICM trend:   Follow-up plan: ICM clinic phone appointment: 08/11/13. The patient was encouraged to stay hydrated with the extremely hot weather.  Copy of note sent to patient's primary care physician, primary cardiologist, and device following physician.  Alvis Lemmings, RN, BSN 07/11/2013 9:42 AM

## 2013-07-20 ENCOUNTER — Encounter: Payer: Self-pay | Admitting: Cardiology

## 2013-07-27 ENCOUNTER — Other Ambulatory Visit: Payer: Self-pay | Admitting: Internal Medicine

## 2013-07-28 ENCOUNTER — Encounter: Payer: Self-pay | Admitting: Internal Medicine

## 2013-08-11 ENCOUNTER — Ambulatory Visit (INDEPENDENT_AMBULATORY_CARE_PROVIDER_SITE_OTHER): Payer: PRIVATE HEALTH INSURANCE | Admitting: *Deleted

## 2013-08-11 DIAGNOSIS — Z95810 Presence of automatic (implantable) cardiac defibrillator: Secondary | ICD-10-CM

## 2013-08-11 DIAGNOSIS — I5022 Chronic systolic (congestive) heart failure: Secondary | ICD-10-CM

## 2013-08-15 ENCOUNTER — Encounter: Payer: Self-pay | Admitting: *Deleted

## 2013-08-15 NOTE — Progress Notes (Signed)
EPIC Encounter for ICM Monitoring  Patient Name: [name] is a 73 y.o. male Date: 08/15/2013 Primary Care Physican: Monico Blitz, MD Primary Cardiologist: Domenic Polite Electrophysiologist: Allred Dry Weight: 322 lbs       In the past month, have you:  1. Gained more than 2 pounds in a day or more than 5 pounds in a week? no  2. Had changes in your medications (with verification of current medications)? no  3. Had more shortness of breath than is usual for you? no  4. Limited your activity because of shortness of breath? no  5. Not been able to sleep because of shortness of breath? no  6. Had increased swelling in your feet or ankles? no  7. Had symptoms of dehydration (dizziness, dry mouth, increased thirst, decreased urine output) no  8. Had changes in sodium restriction? No. He has been eating some peanuts lately that may contribute to some additional sodium intake.  9. Been compliant with medication? Yes   ICM trend:   Follow-up plan: ICM clinic phone appointment: 09/15/13  Copy of note sent to patient's primary care physician, primary cardiologist, and device following physician.  Alvis Lemmings, RN, BSN 08/15/2013 10:17 AM

## 2013-09-15 ENCOUNTER — Ambulatory Visit (INDEPENDENT_AMBULATORY_CARE_PROVIDER_SITE_OTHER): Payer: PRIVATE HEALTH INSURANCE | Admitting: *Deleted

## 2013-09-15 ENCOUNTER — Encounter: Payer: Self-pay | Admitting: *Deleted

## 2013-09-15 DIAGNOSIS — I5022 Chronic systolic (congestive) heart failure: Secondary | ICD-10-CM

## 2013-09-15 DIAGNOSIS — Z95810 Presence of automatic (implantable) cardiac defibrillator: Secondary | ICD-10-CM

## 2013-09-15 NOTE — Progress Notes (Signed)
EPIC Encounter for ICM Monitoring  Patient Name: [name] is a 73 y.o. male Date: 09/15/2013 Primary Care Physican: Monico Blitz, MD Primary Cardiologist: Domenic Polite Electrophysiologist: Allred Dry Weight: 322 lbs       In the past month, have you:  1. Gained more than 2 pounds in a day or more than 5 pounds in a week? no  2. Had changes in your medications (with verification of current medications)? no  3. Had more shortness of breath than is usual for you? no  4. Limited your activity because of shortness of breath? no  5. Not been able to sleep because of shortness of breath? no  6. Had increased swelling in your feet or ankles? no  7. Had symptoms of dehydration (dizziness, dry mouth, increased thirst, decreased urine output) no  8. Had changes in sodium restriction? No. The patient states he has been very conscious of his sodium intake and has adjusted his diet accordingly.  9. Been compliant with medication? Yes   ICM trend:   Follow-up plan: ICM clinic phone appointment: 10/17/13 - full transmission  Copy of note sent to patient's primary care physician, primary cardiologist, and device following physician.  Alvis Lemmings, RN, BSN 09/15/2013 11:12 AM

## 2013-09-22 ENCOUNTER — Telehealth: Payer: Self-pay | Admitting: Internal Medicine

## 2013-09-22 NOTE — Telephone Encounter (Signed)
Patient got a ticket for not properly wearing a seat belt.  Would like a letter stating that he does not have to wear one due to his devise.  He has to submit this letter before 10/24/2013 for his court date I have given Alma Friendly copy of his ticket

## 2013-09-27 NOTE — Telephone Encounter (Signed)
Patient informed that per Dr. Rayann Heman, a device doesn't excuse patient's from wearing a seat belt. Patient informed that wearing a seat belt is the law and is required by all.

## 2013-10-17 ENCOUNTER — Encounter: Payer: Self-pay | Admitting: *Deleted

## 2013-10-17 ENCOUNTER — Ambulatory Visit (INDEPENDENT_AMBULATORY_CARE_PROVIDER_SITE_OTHER): Payer: PRIVATE HEALTH INSURANCE | Admitting: *Deleted

## 2013-10-17 DIAGNOSIS — I428 Other cardiomyopathies: Secondary | ICD-10-CM

## 2013-10-17 DIAGNOSIS — I5022 Chronic systolic (congestive) heart failure: Secondary | ICD-10-CM

## 2013-10-17 DIAGNOSIS — Z95810 Presence of automatic (implantable) cardiac defibrillator: Secondary | ICD-10-CM

## 2013-10-17 LAB — MDC_IDC_ENUM_SESS_TYPE_REMOTE
Battery Voltage: 3.19 V
Brady Statistic RV Percent Paced: 0 %
Date Time Interrogation Session: 20150928122120
HIGH POWER IMPEDANCE MEASURED VALUE: 342 Ohm
HIGH POWER IMPEDANCE MEASURED VALUE: 48 Ohm
HighPow Impedance: 228 Ohm
HighPow Impedance: 59 Ohm
Lead Channel Pacing Threshold Amplitude: 0.625 V
Lead Channel Sensing Intrinsic Amplitude: 12 mV
Lead Channel Sensing Intrinsic Amplitude: 12 mV
Lead Channel Setting Pacing Pulse Width: 0.4 ms
Lead Channel Setting Sensing Sensitivity: 0.3 mV
MDC IDC MSMT LEADCHNL RV IMPEDANCE VALUE: 456 Ohm
MDC IDC MSMT LEADCHNL RV PACING THRESHOLD PULSEWIDTH: 0.4 ms
MDC IDC SET LEADCHNL RV PACING AMPLITUDE: 2 V
MDC IDC SET ZONE DETECTION INTERVAL: 400 ms
Zone Setting Detection Interval: 250 ms
Zone Setting Detection Interval: 300 ms

## 2013-10-17 NOTE — Addendum Note (Signed)
Addended by: Alvis Lemmings C on: 10/17/2013 01:22 PM   Modules accepted: Level of Service

## 2013-10-17 NOTE — Progress Notes (Addendum)
EPIC Encounter for ICM Monitoring  Patient Name: [name] is a 73 y.o. male Date: 10/17/2013 Primary Care Physican: Monico Blitz, MD Primary Cardiologist: Domenic Polite Electrophysiologist: Allred Dry Weight: 322 lbs       In the past month, have you:  1. Gained more than 2 pounds in a day or more than 5 pounds in a week? Yes. The patient states his weight is 328 lbs. It has been going up over the last 3-4 days. He is having some sinus trouble now and does sound stuffy over the phone. He states he has not been as active with his sinus congestion, that started about a week ago, and the rain.   2. Had changes in your medications (with verification of current medications)? no  3. Had more shortness of breath than is usual for you? no  4. Limited your activity because of shortness of breath? no  5. Not been able to sleep because of shortness of breath? no  6. Had increased swelling in your feet or ankles? no  7. Had symptoms of dehydration (dizziness, dry mouth, increased thirst, decreased urine output) no  8. Had changes in sodium restriction? no  9. Been compliant with medication? Yes   ICM trend:   Follow-up plan: ICM clinic phone appointment: 11/17/13  Copy of note sent to patient's primary care physician, primary cardiologist, and device following physician.  Alvis Lemmings, RN, BSN 10/17/2013 1:17 PM

## 2013-10-17 NOTE — Progress Notes (Signed)
Remote ICD transmission.   

## 2013-10-24 ENCOUNTER — Encounter: Payer: Self-pay | Admitting: Cardiology

## 2013-10-28 ENCOUNTER — Encounter: Payer: Self-pay | Admitting: Internal Medicine

## 2013-11-17 ENCOUNTER — Ambulatory Visit (INDEPENDENT_AMBULATORY_CARE_PROVIDER_SITE_OTHER): Payer: PRIVATE HEALTH INSURANCE | Admitting: *Deleted

## 2013-11-17 DIAGNOSIS — I5022 Chronic systolic (congestive) heart failure: Secondary | ICD-10-CM

## 2013-11-17 DIAGNOSIS — Z95810 Presence of automatic (implantable) cardiac defibrillator: Secondary | ICD-10-CM

## 2013-11-21 ENCOUNTER — Encounter: Payer: Self-pay | Admitting: *Deleted

## 2013-11-21 NOTE — Progress Notes (Signed)
EPIC Encounter for ICM Monitoring  Patient Name: [name] is a 73 y.o. male Date: 11/21/2013 Primary Care Physican: Monico Blitz, MD Primary Cardiologist: Domenic Polite Electrophysiologist: Allred Dry Weight: 325 lbs       In the past month, have you:  1. Gained more than 2 pounds in a day or more than 5 pounds in a week? no  2. Had changes in your medications (with verification of current medications)? no  3. Had more shortness of breath than is usual for you? no  4. Limited your activity because of shortness of breath? no  5. Not been able to sleep because of shortness of breath? no  6. Had increased swelling in your feet or ankles? no  7. Had symptoms of dehydration (dizziness, dry mouth, increased thirst, decreased urine output) no  8. Had changes in sodium restriction? no  9. Been compliant with medication? Yes   ICM trend:   Follow-up plan: ICM clinic phone appointment: 01/23/14. The patient is scheduled to follow up with Dr. Rayann Heman in the office on 12/30/13.  Copy of note sent to patient's primary care physician, primary cardiologist, and device following physician.  Alvis Lemmings, RN, BSN 11/21/2013 2:02 PM

## 2013-12-28 ENCOUNTER — Encounter (HOSPITAL_COMMUNITY): Payer: Self-pay | Admitting: Cardiology

## 2013-12-29 ENCOUNTER — Encounter (HOSPITAL_COMMUNITY): Payer: Self-pay | Admitting: Cardiovascular Disease

## 2013-12-30 ENCOUNTER — Encounter: Payer: Self-pay | Admitting: Internal Medicine

## 2013-12-30 ENCOUNTER — Ambulatory Visit (INDEPENDENT_AMBULATORY_CARE_PROVIDER_SITE_OTHER): Payer: PRIVATE HEALTH INSURANCE | Admitting: Internal Medicine

## 2013-12-30 VITALS — BP 103/69 | HR 65 | Ht 72.0 in | Wt 332.8 lb

## 2013-12-30 DIAGNOSIS — I472 Ventricular tachycardia, unspecified: Secondary | ICD-10-CM

## 2013-12-30 DIAGNOSIS — I5022 Chronic systolic (congestive) heart failure: Secondary | ICD-10-CM

## 2013-12-30 LAB — MDC_IDC_ENUM_SESS_TYPE_INCLINIC
HIGH POWER IMPEDANCE MEASURED VALUE: 247 Ohm
HIGH POWER IMPEDANCE MEASURED VALUE: 54 Ohm
HIGH POWER IMPEDANCE MEASURED VALUE: 66 Ohm
HighPow Impedance: 342 Ohm
Lead Channel Impedance Value: 475 Ohm
Lead Channel Pacing Threshold Amplitude: 0.75 V
Lead Channel Pacing Threshold Pulse Width: 0.4 ms
Lead Channel Sensing Intrinsic Amplitude: 12 mV
Lead Channel Setting Pacing Amplitude: 2.5 V
MDC IDC MSMT BATTERY VOLTAGE: 3.17 V
MDC IDC SESS DTM: 20151211103422
MDC IDC SET LEADCHNL RV PACING PULSEWIDTH: 0.4 ms
MDC IDC SET LEADCHNL RV SENSING SENSITIVITY: 0.3 mV
MDC IDC SET ZONE DETECTION INTERVAL: 300 ms
MDC IDC STAT BRADY RV PERCENT PACED: 0.01 %
Zone Setting Detection Interval: 250 ms
Zone Setting Detection Interval: 400 ms

## 2013-12-30 NOTE — Progress Notes (Signed)
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 male who presents today for routine electrophysiology followup.  Since having his last visit, the patient reports doing very well.  He has chronic but stable SOB.  He remains very overweight.  Today, he denies symptoms of palpitations, chest pain, shortness of breath,  lower extremity edema, dizziness, presyncope, syncope, or ICD shocks.  The patient is otherwise without complaint today.   Past Medical History  Diagnosis Date  . Nonischemic dilated cardiomyopathy     EF 35-40%, Cath 10/4 showed normal coronaries, EF 40-45%  . CKD (chronic kidney disease), stage II   . Essential hypertension, benign   . Obstructive sleep apnea   . Dyslipidemia   . Type 2 diabetes mellitus   . DVT of upper extremity (deep vein thrombosis) 2012    Right arm  . Depression   . Arthritis   . Gout     Right foot  . Ventricular tachycardia     s/p Medtronic single chamber ICD 10/27/11   Past Surgical History  Procedure Laterality Date  . Bunionectomy      Right foot  . Carpal tunnel release      Right hand  . Joint replacement    . Total hip arthroplasty      Left  . Total knee arthroplasty      Right  . Appendectomy  1961  . Tonsillectomy and adenoidectomy  ~ 1950  . Cataract extraction w/ intraocular lens  implant, bilateral    . Cardiac defibrillator placement  10/27/11    MDT Protecta XT VR ICD implanted by Dr Caryl Comes for VT  . Left heart catheterization with coronary angiogram N/A 12/13/2010    Procedure: LEFT HEART CATHETERIZATION WITH CORONARY ANGIOGRAM;  Surgeon: Hillary Bow, MD;  Location: [facility] CATH LAB;  Service: Cardiovascular;  Laterality: N/A;  . Right heart catheterization Right 12/13/2010    Procedure: RIGHT HEART CATH;  Surgeon: Hillary Bow, MD;  Location: [facility] CATH LAB;  Service: Cardiovascular;  Laterality: Right;  . Left heart catheterization with coronary angiogram N/A 10/24/2011   Procedure: LEFT HEART CATHETERIZATION WITH CORONARY ANGIOGRAM;  Surgeon: Thayer Headings, MD;  Location: [facility] CATH LAB;  Service: Cardiovascular;  Laterality: N/A;  . Implantable cardioverter defibrillator implant N/A 10/27/2011    Procedure: IMPLANTABLE CARDIOVERTER DEFIBRILLATOR IMPLANT;  Surgeon: Deboraha Sprang, MD;  Location: [facility] CATH LAB;  Service: Cardiovascular;  Laterality: N/A;    Current Outpatient Prescriptions  Medication Sig Dispense Refill  . amiodarone (PACERONE) 200 MG tablet Take 100 mg by mouth daily.    Marland Kitchen amLODipine (NORVASC) 10 MG tablet TAKE ONE TABLET BY MOUTH ONCE DAILY 30 tablet 6  . aspirin (ASPIR-81) 81 MG EC tablet Take 81 mg by mouth daily.      . carvedilol (COREG) 25 MG tablet Take 25 mg by mouth 2 (two) times daily with a meal.     . Cetirizine HCl 10 MG CAPS Take 10 mg by mouth daily.     . DULoxetine (CYMBALTA) 30 MG capsule Take 30 mg by mouth daily.      Marland Kitchen dutasteride (AVODART) 0.5 MG capsule Take 0.5 mg by mouth daily.     . furosemide (LASIX) 40 MG tablet Take 40 mg by mouth every morning.      Marland Kitchen HYDROcodone-acetaminophen (NORCO/VICODIN) 5-325 MG per tablet Take 1 tablet by mouth 2 (two) times daily.    Marland Kitchen lisinopril (PRINIVIL,ZESTRIL) 40 MG tablet  Take 40 mg by mouth daily.      . pravastatin (PRAVACHOL) 40 MG tablet Take 40 mg by mouth daily.      Marland Kitchen allopurinol (ZYLOPRIM) 100 MG tablet Take 200 mg by mouth as needed.    . colchicine (COLCRYS) 0.6 MG tablet Take 0.6 mg by mouth daily as needed. For gout pain    . fluticasone (FLONASE) 50 MCG/ACT nasal spray Place 2 sprays into the nose daily as needed. For dry nose     No current facility-administered medications for this visit.    Physical Exam: Filed Vitals:   12/30/13 0943  BP: 103/69  Pulse: 65  Height: 6' (1.829 m)  Weight: 332 lb 12.8 oz (150.957 kg)  SpO2: 99%    GEN- The patient is obese appearing, alert and oriented x 3 today.   Head- normocephalic, atraumatic Eyes-  Sclera clear,  conjunctiva pink Ears- hearing intact Oropharynx- clear Lungs- Clear to ausculation bilaterally, normal work of breathing Chest- ICD pocket is well healed Heart- Regular rate and rhythm, no murmurs, rubs or gallops, PMI not laterally displaced GI- soft, NT, ND, + BS Extremities- no clubbing, cyanosis, or edema Walks slowly with a cane  ICD interrogation- reviewed in detail today,  See PACEART report  Assessment and Plan:  1.  VT Normal ICD function See Claudia Desanctis Art report VT is well controlled with low dose amiodarone Dr Manuella Ghazi following LFTs/TFTs per patients request Will consider stopping amiodarone if no further VT upon return in 1 year   2. Nonischemic cardiomyopathy Stable volume status Followed  in the optivol ICM clinic No changes  3. HTN Stable No change required today    [facility] Return to see me in 1 year

## 2013-12-30 NOTE — Patient Instructions (Signed)
Your physician recommends that you schedule a follow-up appointment in: 1 year with Dr. Rayann Heman. You will receive a reminder letter in the mail in about 10 months reminding you to call and schedule your appointment. If you don't receive this letter, please contact our office. [facility] device check 03/30/14. Your physician recommends that you continue on your current medications as directed. Please refer to the Current Medication list given to you today.

## 2014-01-30 ENCOUNTER — Encounter: Payer: PRIVATE HEALTH INSURANCE | Admitting: *Deleted

## 2014-01-31 ENCOUNTER — Encounter: Payer: Self-pay | Admitting: *Deleted

## 2014-02-20 ENCOUNTER — Telehealth: Payer: Self-pay | Admitting: *Deleted

## 2014-02-20 MED ORDER — AMLODIPINE BESYLATE 10 MG PO TABS: 10.0000 mg | ORAL_TABLET | Freq: Every day | ORAL | Status: DC

## 2014-02-20 NOTE — Telephone Encounter (Signed)
Amlodipine refilled - past due for follow up with Dr. Domenic Polite.

## 2014-03-06 ENCOUNTER — Ambulatory Visit (INDEPENDENT_AMBULATORY_CARE_PROVIDER_SITE_OTHER): Payer: Medicare Other | Admitting: *Deleted

## 2014-03-06 ENCOUNTER — Encounter: Payer: Self-pay | Admitting: *Deleted

## 2014-03-06 DIAGNOSIS — Z95810 Presence of automatic (implantable) cardiac defibrillator: Secondary | ICD-10-CM | POA: Diagnosis not present

## 2014-03-06 DIAGNOSIS — I5022 Chronic systolic (congestive) heart failure: Secondary | ICD-10-CM | POA: Diagnosis not present

## 2014-03-06 NOTE — Progress Notes (Signed)
EPIC Encounter for ICM Monitoring  Patient Name: [name] is a 74 y.o. male Date: 03/06/2014 Primary Care Physican: Monico Blitz, MD Primary Cardiologist: Domenic Polite Electrophysiologist: Allred Dry Weight: 325 lbs       In the past month, have you:  1. Gained more than 2 pounds in a day or more than 5 pounds in a week? No. Occasionally weight will run up to 327 lbs.  2. Had changes in your medications (with verification of current medications)? no  3. Had more shortness of breath than is usual for you? no  4. Limited your activity because of shortness of breath? no  5. Not been able to sleep because of shortness of breath? no  6. Had increased swelling in your feet or ankles? no  7. Had symptoms of dehydration (dizziness, dry mouth, increased thirst, decreased urine output) no  8. Had changes in sodium restriction? no  9. Been compliant with medication? Yes   ICM trend:   Follow-up plan: ICM clinic phone appointment: 04/06/14  Copy of note sent to patient's primary care physician, primary cardiologist, and device following physician.  Alvis Lemmings, RN, BSN 03/06/2014 3:08 PM

## 2014-04-06 ENCOUNTER — Ambulatory Visit (INDEPENDENT_AMBULATORY_CARE_PROVIDER_SITE_OTHER): Payer: Medicare Other | Admitting: *Deleted

## 2014-04-06 ENCOUNTER — Encounter: Payer: Self-pay | Admitting: *Deleted

## 2014-04-06 ENCOUNTER — Other Ambulatory Visit: Payer: Self-pay | Admitting: Internal Medicine

## 2014-04-06 DIAGNOSIS — I472 Ventricular tachycardia, unspecified: Secondary | ICD-10-CM

## 2014-04-06 DIAGNOSIS — Z95810 Presence of automatic (implantable) cardiac defibrillator: Secondary | ICD-10-CM

## 2014-04-06 DIAGNOSIS — I5022 Chronic systolic (congestive) heart failure: Secondary | ICD-10-CM

## 2014-04-06 NOTE — Progress Notes (Signed)
EPIC Encounter for ICM Monitoring  Patient Name: [name] is a 74 y.o. male Date: 04/06/2014 Primary Care Physican: Monico Blitz, MD Primary Cardiologist: Domenic Polite Electrophysiologist: Allred Dry Weight: 330 lbs       In the past month, have you:  1. Gained more than 2 pounds in a day or more than 5 pounds in a week? no  2. Had changes in your medications (with verification of current medications)? Yes. He states he was previously on an allergy pill, but this was stopped as it was drying him out to much and causing his lips to crack.   3. Had more shortness of breath than is usual for you? no  4. Limited your activity because of shortness of breath? no  5. Not been able to sleep because of shortness of breath? no  6. Had increased swelling in your feet or ankles? no  7. Had symptoms of dehydration (dizziness, dry mouth, increased thirst, decreased urine output) no  8. Had changes in sodium restriction? no  9. Been compliant with medication? Yes   ICM trend:   Follow-up plan: ICM clinic phone appointment 05/08/14  Copy of note sent to patient's primary care physician, primary cardiologist, and device following physician.  Alvis Lemmings, RN, BSN 04/06/2014 6:09 PM

## 2014-04-06 NOTE — Progress Notes (Signed)
Remote ICD transmission.   

## 2014-04-06 NOTE — Addendum Note (Signed)
Addended by: Alvis Lemmings C on: 04/06/2014 06:11 PM   Modules accepted: Level of Service

## 2014-04-08 LAB — MDC_IDC_ENUM_SESS_TYPE_REMOTE
Date Time Interrogation Session: 20160317155401
HIGH POWER IMPEDANCE MEASURED VALUE: 342 Ohm
HIGH POWER IMPEDANCE MEASURED VALUE: 51 Ohm
HighPow Impedance: 63 Ohm
Lead Channel Impedance Value: 456 Ohm
Lead Channel Pacing Threshold Amplitude: 0.75 V
Lead Channel Pacing Threshold Pulse Width: 0.4 ms
Lead Channel Sensing Intrinsic Amplitude: 13.25 mV
Lead Channel Setting Pacing Amplitude: 2.5 V
Lead Channel Setting Sensing Sensitivity: 0.3 mV
MDC IDC MSMT BATTERY VOLTAGE: 3.17 V
MDC IDC MSMT LEADCHNL RV SENSING INTR AMPL: 13.25 mV
MDC IDC SET LEADCHNL RV PACING PULSEWIDTH: 0.4 ms
MDC IDC SET ZONE DETECTION INTERVAL: 250 ms
MDC IDC STAT BRADY RV PERCENT PACED: 0.01 %
Zone Setting Detection Interval: 300 ms
Zone Setting Detection Interval: 400 ms

## 2014-04-11 ENCOUNTER — Encounter: Payer: Self-pay | Admitting: *Deleted

## 2014-04-18 ENCOUNTER — Encounter: Payer: Self-pay | Admitting: Internal Medicine

## 2014-05-08 ENCOUNTER — Ambulatory Visit (INDEPENDENT_AMBULATORY_CARE_PROVIDER_SITE_OTHER): Payer: Medicare Other | Admitting: *Deleted

## 2014-05-08 DIAGNOSIS — I5022 Chronic systolic (congestive) heart failure: Secondary | ICD-10-CM

## 2014-05-08 DIAGNOSIS — Z95810 Presence of automatic (implantable) cardiac defibrillator: Secondary | ICD-10-CM | POA: Diagnosis not present

## 2014-05-09 ENCOUNTER — Encounter: Payer: Self-pay | Admitting: *Deleted

## 2014-05-09 NOTE — Progress Notes (Signed)
EPIC Encounter for ICM Monitoring  Patient Name: [name] is a 74 y.o. male Date: 05/09/2014 Primary Care Physican: Monico Blitz, MD Primary Cardiologist: Domenic Polite Electrophysiologist: Allred Dry Weight: 330 lbw       In the past month, have you:  1. Gained more than 2 pounds in a day or more than 5 pounds in a week? no  2. Had changes in your medications (with verification of current medications)? no  3. Had more shortness of breath than is usual for you? no  4. Limited your activity because of shortness of breath? no  5. Not been able to sleep because of shortness of breath? no  6. Had increased swelling in your feet or ankles? no  7. Had symptoms of dehydration (dizziness, dry mouth, increased thirst, decreased urine output) no  8. Had changes in sodium restriction? no  9. Been compliant with medication? Yes   ICM trend:   Follow-up plan: ICM clinic phone appointment: 06/12/14. No changes made today.   Copy of note sent to patient's primary care physician, primary cardiologist, and device following physician.  Alvis Lemmings, RN, BSN 05/09/2014 10:15 AM

## 2014-06-12 ENCOUNTER — Ambulatory Visit (INDEPENDENT_AMBULATORY_CARE_PROVIDER_SITE_OTHER): Payer: Medicare Other | Admitting: *Deleted

## 2014-06-12 ENCOUNTER — Encounter: Payer: Self-pay | Admitting: *Deleted

## 2014-06-12 DIAGNOSIS — Z95810 Presence of automatic (implantable) cardiac defibrillator: Secondary | ICD-10-CM

## 2014-06-12 DIAGNOSIS — I5022 Chronic systolic (congestive) heart failure: Secondary | ICD-10-CM | POA: Diagnosis not present

## 2014-06-12 NOTE — Progress Notes (Signed)
EPIC Encounter for ICM Monitoring  Patient Name: [name] is a 74 y.o. male Date: 06/12/2014 Primary Care Physican: Monico Blitz, MD Primary Cardiologist: Domenic Polite Electrophysiologist: Allred Dry Weight: 330 lbs       In the past month, have you:  1. Gained more than 2 pounds in a day or more than 5 pounds in a week? No. His weight today was 327 lbs.  2. Had changes in your medications (with verification of current medications)? no  3. Had more shortness of breath than is usual for you? no  4. Limited your activity because of shortness of breath? no  5. Not been able to sleep because of shortness of breath? no  6. Had increased swelling in your feet or ankles? no  7. Had symptoms of dehydration (dizziness, dry mouth, increased thirst, decreased urine output) no  8. Had changes in sodium restriction? no  9. Been compliant with medication? Yes   ICM trend:   Follow-up plan: ICM clinic phone appointment: 07/13/14. No changes made today.  Copy of note sent to patient's primary care physician, primary cardiologist, and device following physician.  Alvis Lemmings, RN, BSN 06/12/2014 1:01 PM

## 2014-07-13 ENCOUNTER — Ambulatory Visit (INDEPENDENT_AMBULATORY_CARE_PROVIDER_SITE_OTHER): Payer: Medicare Other | Admitting: *Deleted

## 2014-07-13 ENCOUNTER — Encounter: Payer: Self-pay | Admitting: *Deleted

## 2014-07-13 DIAGNOSIS — I472 Ventricular tachycardia, unspecified: Secondary | ICD-10-CM

## 2014-07-13 DIAGNOSIS — I5022 Chronic systolic (congestive) heart failure: Secondary | ICD-10-CM

## 2014-07-13 DIAGNOSIS — Z95810 Presence of automatic (implantable) cardiac defibrillator: Secondary | ICD-10-CM

## 2014-07-13 NOTE — Addendum Note (Signed)
Addended by: Alvis Lemmings C on: 07/13/2014 03:17 PM   Modules accepted: Level of Service

## 2014-07-13 NOTE — Progress Notes (Addendum)
EPIC Encounter for ICM Monitoring  Patient Name: [name] is a 74 y.o. male Date: 07/13/2014 Primary Care Physican: Monico Blitz, MD Primary Cardiologist: Domenic Polite Electrophysiologist: Allred Dry Weight:  330 lbs       In the past month, have you:  1. Gained more than 2 pounds in a day or more than 5 pounds in a week? At one point  2. Had changes in your medications (with verification of current medications)? no  3. Had more shortness of breath than is usual for you? no  4. Limited your activity because of shortness of breath? no  5. Not been able to sleep because of shortness of breath? no  6. Had increased swelling in your feet or ankles? no  7. Had symptoms of dehydration (dizziness, dry mouth, increased thirst, decreased urine output) no  8. Had changes in sodium restriction? no  9. Been compliant with medication? Yes   ICM trend:   Follow-up plan: ICM clinic phone appointment: 08/21/14. The patient's optivol readings have been elevated from ~ 5/19- present. He reports no real changes to his symptoms. His weight was up about 3-4 lbs at some point over the last month. Impedence appears to be returning to baseline. He is taking lasix 40 mg once daily. I have asked him to take an extra 20 mg of lasix x 3 days starting tomorrow, then resume his normal dosing. He voices understanding.   Copy of note sent to patient's primary care physician, primary cardiologist, and device following physician.  Alvis Lemmings, RN, BSN 07/13/2014 3:07 PM   Agreed         Thanks            ----- Message -----     From: Emily Filbert, RN     Sent: 07/13/2014  3:16 PM      To: Thompson Grayer, MD

## 2014-07-13 NOTE — Progress Notes (Signed)
Remote ICD transmission.   

## 2014-07-13 NOTE — Progress Notes (Signed)
Agree with recommendations. Keep an eye on weight and can discuss further at followup.

## 2014-07-13 NOTE — Addendum Note (Signed)
Addended by: Satira Sark on: 07/13/2014 05:39 PM   Modules accepted: Level of Service

## 2014-07-16 LAB — CUP PACEART REMOTE DEVICE CHECK
Battery Voltage: 3.16 V
HIGH POWER IMPEDANCE MEASURED VALUE: 59 Ohm
HighPow Impedance: 304 Ohm
HighPow Impedance: 47 Ohm
Lead Channel Pacing Threshold Amplitude: 0.625 V
Lead Channel Sensing Intrinsic Amplitude: 15.375 mV
Lead Channel Sensing Intrinsic Amplitude: 15.375 mV
Lead Channel Setting Pacing Amplitude: 2.5 V
Lead Channel Setting Pacing Pulse Width: 0.4 ms
Lead Channel Setting Sensing Sensitivity: 0.3 mV
MDC IDC MSMT LEADCHNL RV IMPEDANCE VALUE: 456 Ohm
MDC IDC MSMT LEADCHNL RV PACING THRESHOLD PULSEWIDTH: 0.4 ms
MDC IDC SESS DTM: 20160623041707
MDC IDC SET ZONE DETECTION INTERVAL: 250 ms
MDC IDC STAT BRADY RV PERCENT PACED: 0.06 %
Zone Setting Detection Interval: 300 ms
Zone Setting Detection Interval: 400 ms

## 2014-07-19 ENCOUNTER — Encounter: Payer: Self-pay | Admitting: Cardiology

## 2014-07-31 ENCOUNTER — Encounter: Payer: Self-pay | Admitting: Internal Medicine

## 2014-08-04 ENCOUNTER — Ambulatory Visit: Payer: Medicare Other | Admitting: Cardiology

## 2014-08-21 ENCOUNTER — Encounter: Payer: Self-pay | Admitting: *Deleted

## 2014-08-21 ENCOUNTER — Ambulatory Visit (INDEPENDENT_AMBULATORY_CARE_PROVIDER_SITE_OTHER): Payer: Medicare Other | Admitting: Cardiology

## 2014-08-21 ENCOUNTER — Ambulatory Visit (INDEPENDENT_AMBULATORY_CARE_PROVIDER_SITE_OTHER): Payer: Medicare Other | Admitting: *Deleted

## 2014-08-21 ENCOUNTER — Encounter: Payer: Self-pay | Admitting: Cardiology

## 2014-08-21 VITALS — BP 118/60 | HR 64 | Ht 72.0 in | Wt 327.0 lb

## 2014-08-21 DIAGNOSIS — I10 Essential (primary) hypertension: Secondary | ICD-10-CM

## 2014-08-21 DIAGNOSIS — I428 Other cardiomyopathies: Secondary | ICD-10-CM

## 2014-08-21 DIAGNOSIS — Z95810 Presence of automatic (implantable) cardiac defibrillator: Secondary | ICD-10-CM

## 2014-08-21 DIAGNOSIS — I5022 Chronic systolic (congestive) heart failure: Secondary | ICD-10-CM | POA: Diagnosis not present

## 2014-08-21 DIAGNOSIS — I429 Cardiomyopathy, unspecified: Secondary | ICD-10-CM

## 2014-08-21 NOTE — Patient Instructions (Signed)
Your physician recommends that you continue on your current medications as directed. Please refer to the Current Medication list given to you today. Your physician recommends that you schedule a follow-up appointment in: 6 months. You will receive a reminder letter in the mail in about 4 months reminding you to call and schedule your appointment. If you don't receive this letter, please contact our office. 

## 2014-08-21 NOTE — Progress Notes (Signed)
Cardiology Office Note  Date: 08/21/2014   ID: [name], DOB 1940-02-12, MRN [id number]  PCP: Monico Blitz, MD  Primary Cardiologist: Rozann Lesches, MD   Chief Complaint  Patient presents with  . Cardiomyopathy    History of Present Illness: [name] is a 74 y.o. male last seen in April 2015. Interval follow-up with Dr. Rayann Heman and the device clinic continue. He did have elevated Optivol readings noted in June, Lasix was temporarily increased.  He comes in today for a follow-up visit.  Weight is stable compared to his last visit, 327 pounds. He is morbidly obese, reports fluctuations in his weight between 325 and 330 pounds routinely. He does not endorse any progressing shortness of breath, orthopnea, or leg edema. Reports compliance with Lasix. We reviewed his medications.  He reports a home device transmission made earlier this morning, results pending. No palpitations or device shocks.   Past Medical History  Diagnosis Date  . Nonischemic dilated cardiomyopathy     EF 35-40%, Cath 10/4 showed normal coronaries, EF 40-45%  . CKD (chronic kidney disease), stage II   . Essential hypertension, benign   . Obstructive sleep apnea   . Dyslipidemia   . Type 2 diabetes mellitus   . DVT of upper extremity (deep vein thrombosis) 2012    Right arm  . Depression   . Arthritis   . Gout     Right foot  . Ventricular tachycardia     s/p Medtronic single chamber ICD 10/27/11    Current Outpatient Prescriptions  Medication Sig Dispense Refill  . allopurinol (ZYLOPRIM) 100 MG tablet Take 200 mg by mouth as needed.    Marland Kitchen amiodarone (PACERONE) 200 MG tablet Take 100 mg by mouth daily.    Marland Kitchen amLODipine (NORVASC) 10 MG tablet Take 1 tablet (10 mg total) by mouth daily. 30 tablet 2  . aspirin (ASPIR-81) 81 MG EC tablet Take 81 mg by mouth daily.      . carvedilol (COREG) 25 MG tablet Take 25 mg by mouth 2 (two) times daily with a meal.     . colchicine (COLCRYS) 0.6 MG  tablet Take 0.6 mg by mouth daily as needed. For gout pain    . DULoxetine (CYMBALTA) 30 MG capsule Take 30 mg by mouth daily.      . finasteride (PROSCAR) 5 MG tablet Take 5 mg by mouth daily.    . furosemide (LASIX) 40 MG tablet Take 40 mg by mouth every morning.      Marland Kitchen HYDROcodone-acetaminophen (NORCO/VICODIN) 5-325 MG per tablet Take 1 tablet by mouth 2 (two) times daily.    Marland Kitchen lisinopril (PRINIVIL,ZESTRIL) 40 MG tablet Take 40 mg by mouth daily.      . pravastatin (PRAVACHOL) 40 MG tablet Take 40 mg by mouth daily.      . fluticasone (FLONASE) 50 MCG/ACT nasal spray Place 2 sprays into the nose daily as needed. For dry nose     No current facility-administered medications for this visit.    Allergies:  Codeine; Milk-related compounds; and Warfarin sodium   Social History: The patient  reports that he quit smoking about 32 years ago. His smoking use included Cigarettes. He has a 15 pack-year smoking history. He has never used smokeless tobacco. He reports that he does not drink alcohol or use illicit drugs.   ROS:  Please see the history of present illness. Otherwise, complete review of systems is positive for NYHA class 2 dyspnea , no angina.  All other systems are reviewed and negative.   Physical Exam: VS:  BP 118/60 mmHg  Pulse 64  Ht 6' (1.829 m)  Wt 327 lb (148.326 kg)  BMI 44.34 kg/m2  SpO2 96%, BMI Body mass index is 44.34 kg/(m^2).  Wt Readings from Last 3 Encounters:  08/21/14 327 lb (148.326 kg)  12/30/13 332 lb 12.8 oz (150.957 kg)  04/28/13 327 lb 6.4 oz (148.508 kg)    Morbidly obese male in no acute distress.  HEENT: Conjunctiva and lids normal, oropharynx clear.  Neck: Supple, no obvious elevated JVP with increased girth, no carotid bruits, no thyromegaly.  Lungs: Clear to auscultation, diminished throughout, nonlabored breathing at rest.  Thorax: Stable device pocket site.  Cardiac: Regular rate and rhythm, no S3, distant heart sounds, no pericardial rub.   Abdomen: Soft, nontender, bowel sounds present.  Extremities: No pitting edema, distal pulses 2+.   ECG: ECG is ordered today  An shows sinus bradycardia with PVCs , IVCD, prolonged PR interval.  Other Studies Reviewed Today:  Echocardiogram from February 2014 showed LVEF 40-45% with diffuse hypokinesis, most prominent in the basal inferoposterior wall, grade 1 diastolic dysfunction, mild mitral regurgitation, mild to moderate left atrial enlargement, device wire in right heart, PASP 26 mmHg.   Assessment and Plan:  1. Chronic systolic heart failure, LVEF 40-45% as of 2014. Clinically, he has been stable. Optivol readings noted from June. Weight is stable today. No changes made in diuretic dose, although if his Optivol readings are again abnormal at his repeat device check, we will advance his standing dose further. We will plan on obtaining a follow-up echocardiogram around the time of his next visit.  2. Morbid obesity.  3. Nonischemic cardiomyopathy with history of VT, status post Medtronic ICD, followed by Dr. Rayann Heman.  Current medicines were reviewed with the patient today.   Orders Placed This Encounter  Procedures  . EKG 12-Lead    Disposition: FU with me in 6 months.   Signed, Satira Sark, MD, [facility] 08/21/2014 10:54 AM    [facility] [place], [place], [place] [address] Phone: [phone]; Fax: [phone]

## 2014-08-24 ENCOUNTER — Encounter: Payer: Self-pay | Admitting: *Deleted

## 2014-08-24 NOTE — Progress Notes (Signed)
EPIC Encounter for ICM Monitoring  Patient Name: [name] is a 74 y.o. male Date: 08/24/2014 Primary Care Physican: Monico Blitz, MD Primary Cardiologist: Domenic Polite Electrophysiologist: Allred Dry Weight: 325-329 lbs       In the past month, have you:  1. Gained more than 2 pounds in a day or more than 5 pounds in a week? no  2. Had changes in your medications (with verification of current medications)? no  3. Had more shortness of breath than is usual for you? no  4. Limited your activity because of shortness of breath? no  5. Not been able to sleep because of shortness of breath? no  6. Had increased swelling in your feet or ankles? no  7. Had symptoms of dehydration (dizziness, dry mouth, increased thirst, decreased urine output) no  8. Had changes in sodium restriction? no  9. Been compliant with medication? Yes   ICM trend:   Follow-up plan: ICM clinic phone appointment: 10/12/14. No changes made today.  Copy of note sent to patient's primary care physician, primary cardiologist, and device following physician.  Alvis Lemmings, RN, BSN 08/24/2014 12:11 PM

## 2014-10-12 ENCOUNTER — Other Ambulatory Visit: Payer: Self-pay | Admitting: *Deleted

## 2014-10-12 ENCOUNTER — Ambulatory Visit (INDEPENDENT_AMBULATORY_CARE_PROVIDER_SITE_OTHER): Payer: Medicare Other | Admitting: *Deleted

## 2014-10-12 ENCOUNTER — Telehealth: Payer: Self-pay | Admitting: *Deleted

## 2014-10-12 DIAGNOSIS — I5022 Chronic systolic (congestive) heart failure: Secondary | ICD-10-CM

## 2014-10-12 DIAGNOSIS — Z95810 Presence of automatic (implantable) cardiac defibrillator: Secondary | ICD-10-CM | POA: Diagnosis not present

## 2014-10-12 DIAGNOSIS — I429 Cardiomyopathy, unspecified: Secondary | ICD-10-CM

## 2014-10-12 DIAGNOSIS — I428 Other cardiomyopathies: Secondary | ICD-10-CM

## 2014-10-12 MED ORDER — FUROSEMIDE 40 MG PO TABS: 60.0000 mg | ORAL_TABLET | ORAL | Status: DC

## 2014-10-12 NOTE — Telephone Encounter (Signed)
-----   Message from Satira Sark, MD sent at 10/12/2014  2:49 PM EDT ----- Reviewed note and recommendations. Agree with discussion about limiting fluid intake and sodium. This has been difficult for him. Would increase Lasix to 60 mg daily for the time being, he should keep follow-up with PCP, and would recommend a BMET in the next few weeks.  ----- Message -----    From: [name], RN    Sent: 10/12/2014   2:10 PM      To: Satira Sark, MD

## 2014-10-12 NOTE — Progress Notes (Addendum)
EPIC Encounter for ICM Monitoring  Patient Name: [name] is a 74 y.o. male Date: 10/12/2014 Primary Care Physican: Monico Blitz, MD Primary Cardiologist: Domenic Polite Electrophysiologist: Allred Dry Weight: 325 lb       In the past month, have you:  1. Gained more than 2 pounds in a day or more than 5 pounds in a week? no  2. Had changes in your medications (with verification of current medications)? no  3. Had more shortness of breath than is usual for you? no  4. Limited your activity because of shortness of breath? no  5. Not been able to sleep because of shortness of breath? no  6. Had increased swelling in your feet or ankles? no  7. Had symptoms of dehydration (dizziness, dry mouth, increased thirst, decreased urine output) no  8. Had changes in sodium restriction? no  9. Been compliant with medication? Yes   ICM trend:   Follow-up plan: ICM clinic phone appointment 10/25/2014.  Optivol impedance below baseline starting ~09/24/2014 until time of transmission, 10/12/2014.   He estimates he drinks the following amount of fluids a day, 8 glasses (8oz) a day in addition to 3 cokes (12 oz each) which equals about 100 oz a day.  He goes to restaurants about 4 - 5 times a week.  Education given that drinking large amount of fluids and eating restaurant foods high in sodium can case fluid retention. Explained a low salt diet is recommended and to drink fluids in moderation.  Patient denied SOB, swelling in legs or abdomen and has had 1lb wt gain since 08/21/2014.  Confirmed he takes Lasix 40mg  daily and is compliant.    Low Sodium Eating Plan patient education mailed to patient. Repeat Optivol Transmission in 2 weeks.         He has appointment with Dr Manuella Ghazi on Monday, 10/16/2014.  No recent labs.             Advised patient I would forward to Dr Domenic Polite and Dr Rayann Heman for review.  I will call back with any recommendations.  Copy of note sent to patient's primary care physician,  primary cardiologist, and device following physician.  [name], RN, CCM 10/12/2014 11:52 AM   Reviewed note and recommendations. Agree with discussion about limiting fluid intake and sodium. This has been difficult for him. Would increase Lasix to 60 mg daily for the time being, he should keep follow-up with PCP, and would recommend a BMET in the next few weeks.         ----- Message -----     From: [name], RN     Sent: 10/12/2014  2:10 PM      To: Satira Sark, MD    Spoke with patient and he has been notified of the above recommendations by Dr McDowell's office, Georgina Peer, LPN.

## 2014-10-12 NOTE — Telephone Encounter (Signed)
Patient informed and verbalized understanding of plan. Patient request to have lab work done at PCP office. Copy sent to [facility] office.

## 2014-10-16 LAB — CUP PACEART REMOTE DEVICE CHECK
Battery Voltage: 3.16 V
Brady Statistic RV Percent Paced: 0.09 %
HIGH POWER IMPEDANCE MEASURED VALUE: 45 Ohm
HighPow Impedance: 304 Ohm
HighPow Impedance: 56 Ohm
Lead Channel Impedance Value: 418 Ohm
Lead Channel Pacing Threshold Pulse Width: 0.4 ms
Lead Channel Sensing Intrinsic Amplitude: 13.875 mV
Lead Channel Sensing Intrinsic Amplitude: 13.875 mV
Lead Channel Setting Pacing Pulse Width: 0.4 ms
MDC IDC MSMT LEADCHNL RV PACING THRESHOLD AMPLITUDE: 0.75 V
MDC IDC SESS DTM: 20160922041708
MDC IDC SET LEADCHNL RV PACING AMPLITUDE: 2.5 V
MDC IDC SET LEADCHNL RV SENSING SENSITIVITY: 0.3 mV
Zone Setting Detection Interval: 250 ms
Zone Setting Detection Interval: 300 ms
Zone Setting Detection Interval: 400 ms

## 2014-10-20 ENCOUNTER — Telehealth: Payer: Self-pay

## 2014-10-20 ENCOUNTER — Encounter: Payer: Self-pay | Admitting: Cardiology

## 2014-10-20 NOTE — Telephone Encounter (Signed)
Spoke with patient after receiving a voice mail message from patient.  He asked how much fluids can he have a day.  I stated generally try to stay within 6 to 8 (8 oz) glasses of fluids a day unless the cardiologist has restricted him to a lower amount.  He stated his weight has dropped to 322 lbs since increasing Lasix.  He said he is staying on low salt diet.  He stated he will have labs drawn on 10/30/2014.  He stated he is feeling good right now.

## 2014-10-25 ENCOUNTER — Ambulatory Visit (INDEPENDENT_AMBULATORY_CARE_PROVIDER_SITE_OTHER): Payer: Medicare Other

## 2014-10-25 DIAGNOSIS — Z95810 Presence of automatic (implantable) cardiac defibrillator: Secondary | ICD-10-CM

## 2014-10-25 DIAGNOSIS — I5022 Chronic systolic (congestive) heart failure: Secondary | ICD-10-CM

## 2014-10-25 NOTE — Progress Notes (Addendum)
EPIC Encounter for ICM Monitoring  Patient Name: [name] is a 74 y.o. male Date: 10/25/2014 Primary Care Physican: Monico Blitz, MD Primary Cardiologist: Domenic Polite Electrophysiologist: Allred Dry Weight: 316 lbs       In the past month, have you:  1. Gained more than 2 pounds in a day or more than 5 pounds in a week? no  2. Had changes in your medications (with verification of current medications)? no  3. Had more shortness of breath than is usual for you? no  4. Limited your activity because of shortness of breath? no  5. Not been able to sleep because of shortness of breath? no  6. Had increased swelling in your feet or ankles? no  7. Had symptoms of dehydration (dizziness, dry mouth, increased thirst, decreased urine output) no  8. Had changes in sodium restriction? no  9. Been compliant with medication? Yes   ICM trend:   Follow-up plan: ICM clinic phone appointment 11/22/2014 and appointment with Dr Rayann Heman 12/22/2014.  Optivol impedance trended back to baseline.  Patient stated he is feeling much better.  He stated he is drinking a moderate amount of fluids.  He has no complaints today.  He will be having labs drawn at Dr Trena Platt office 10/30/2014. His weight is down to 316 lbs from 322 lbs.   No changes today.    Copy of note sent to patient's primary care physician, primary cardiologist, and device following physician.  [name], RN, CCM 10/25/2014 10:59 AM

## 2014-11-01 ENCOUNTER — Encounter: Payer: Self-pay | Admitting: Internal Medicine

## 2014-11-01 ENCOUNTER — Encounter: Payer: Self-pay | Admitting: *Deleted

## 2014-11-15 ENCOUNTER — Telehealth: Payer: Self-pay | Admitting: *Deleted

## 2014-11-15 MED ORDER — FUROSEMIDE 40 MG PO TABS: ORAL_TABLET | ORAL | Status: DC

## 2014-11-15 NOTE — Telephone Encounter (Signed)
-----   Message from Satira Sark, MD sent at 11/10/2014  7:44 AM EDT ----- Reviewed. BUN 38 and creatinine 1.7. I do not have a recent baseline for comparison, his creatinine was 1.4 greater than a year ago. Lasix was increased to 60 mg daily within the last few weeks. If his weight has come down and he is feeling better, try and cut the Lasix back to 40 mg alternating with 60 mg every other day.

## 2014-11-15 NOTE — Telephone Encounter (Signed)
Patient informed and says that he feels better. Patient will changed furosemide to 40 mg alternating with 60 mg every other day. Copy sent to PCP also.

## 2014-11-22 ENCOUNTER — Ambulatory Visit (INDEPENDENT_AMBULATORY_CARE_PROVIDER_SITE_OTHER): Payer: Medicare Other

## 2014-11-22 ENCOUNTER — Telehealth: Payer: Self-pay

## 2014-11-22 DIAGNOSIS — I5022 Chronic systolic (congestive) heart failure: Secondary | ICD-10-CM | POA: Diagnosis not present

## 2014-11-22 DIAGNOSIS — Z95810 Presence of automatic (implantable) cardiac defibrillator: Secondary | ICD-10-CM | POA: Diagnosis not present

## 2014-11-22 NOTE — Telephone Encounter (Signed)
ICM transmission received.  Attempted call to patient and no answering machine.   Patient left voice mail message if transmission was received.

## 2014-11-23 NOTE — Telephone Encounter (Signed)
Attempted call to patient and line busy.

## 2014-11-24 NOTE — Telephone Encounter (Signed)
Spoke with patient.

## 2014-11-24 NOTE — Progress Notes (Signed)
EPIC Encounter for ICM Monitoring  Patient Name: [name] is a 74 y.o. male Date: 11/24/2014 Primary Care Physican: Monico Blitz, MD Primary Cardiologist: Domenic Polite Electrophysiologist: Allred Dry Weight: 314 lb       In the past month, have you:  1. Gained more than 2 pounds in a day or more than 5 pounds in a week? no  2. Had changes in your medications (with verification of current medications)? no  3. Had more shortness of breath than is usual for you? no  4. Limited your activity because of shortness of breath? no  5. Not been able to sleep because of shortness of breath? no  6. Had increased swelling in your feet or ankles? no  7. Had symptoms of dehydration (dizziness, dry mouth, increased thirst, decreased urine output) no  8. Had changes in sodium restriction? no  9. Been compliant with medication? Yes   ICM trend: 11/22/2014    Follow-up plan: ICM clinic phone appointment 01/23/2015 and has appointment with Dr Rayann Heman 12/22/2014.  Optivol thoracic impedance above the baseline which may suggest dryness and he denied any dizziness.  He reported he does have dry mouth and drinks a lot of fluids during the day.  He reported he is feeling ok at this time.  No changes today.   Copy of note sent to patient's primary care physician, primary cardiologist, and device following physician.  [name], RN, CCM 11/24/2014 12:44 PM

## 2014-12-22 ENCOUNTER — Ambulatory Visit (INDEPENDENT_AMBULATORY_CARE_PROVIDER_SITE_OTHER): Payer: Medicare Other | Admitting: Internal Medicine

## 2014-12-22 ENCOUNTER — Encounter: Payer: Self-pay | Admitting: Internal Medicine

## 2014-12-22 VITALS — BP 118/72 | HR 62 | Ht 72.0 in | Wt 306.0 lb

## 2014-12-22 DIAGNOSIS — Z95810 Presence of automatic (implantable) cardiac defibrillator: Secondary | ICD-10-CM

## 2014-12-22 DIAGNOSIS — I472 Ventricular tachycardia, unspecified: Secondary | ICD-10-CM

## 2014-12-22 DIAGNOSIS — I5022 Chronic systolic (congestive) heart failure: Secondary | ICD-10-CM | POA: Diagnosis not present

## 2014-12-22 LAB — CUP PACEART INCLINIC DEVICE CHECK
Battery Voltage: 3.14 V
HIGH POWER IMPEDANCE MEASURED VALUE: 342 Ohm
HIGH POWER IMPEDANCE MEASURED VALUE: 52 Ohm
HIGH POWER IMPEDANCE MEASURED VALUE: 66 Ohm
Lead Channel Impedance Value: 475 Ohm
Lead Channel Sensing Intrinsic Amplitude: 13.375 mV
Lead Channel Setting Pacing Amplitude: 2.5 V
Lead Channel Setting Pacing Pulse Width: 0.4 ms
Lead Channel Setting Sensing Sensitivity: 0.3 mV
MDC IDC LEAD IMPLANT DT: 20131007
MDC IDC LEAD LOCATION: 753860
MDC IDC LEAD MODEL: 7121
MDC IDC MSMT LEADCHNL RV PACING THRESHOLD AMPLITUDE: 0.75 V
MDC IDC MSMT LEADCHNL RV PACING THRESHOLD PULSEWIDTH: 0.4 ms
MDC IDC SESS DTM: 20161202111049
MDC IDC STAT BRADY RV PERCENT PACED: 0.05 %

## 2014-12-22 NOTE — Progress Notes (Signed)
PCP: Monico Blitz, MD Primary Cardiologist:  Dr Dalbert Batman is a 74 y.o. male who presents today for routine electrophysiology followup.  Since having his last visit, the patient reports doing very well.  He has chronic but stable SOB.  He remains very overweight but has been able to lose some weight.  His primary concern is with DJD.  Today, he denies symptoms of palpitations, chest pain, shortness of breath,  lower extremity edema, dizziness, presyncope, syncope, or ICD shocks.  The patient is otherwise without complaint today.   Past Medical History  Diagnosis Date  . Nonischemic dilated cardiomyopathy (HCC)     EF 35-40%, Cath 10/4 showed normal coronaries, EF 40-45%  . CKD (chronic kidney disease), stage II   . Essential hypertension, benign   . Obstructive sleep apnea   . Dyslipidemia   . Type 2 diabetes mellitus ([place])   . DVT of upper extremity (deep vein thrombosis) ([place]) 2012    Right arm  . Depression   . Arthritis   . Gout     Right foot  . Ventricular tachycardia [facility])     s/p Medtronic single chamber ICD 10/27/11   Past Surgical History  Procedure Laterality Date  . Bunionectomy      Right foot  . Carpal tunnel release      Right hand  . Joint replacement    . Total hip arthroplasty      Left  . Total knee arthroplasty      Right  . Appendectomy  1961  . Tonsillectomy and adenoidectomy  ~ 1950  . Cataract extraction w/ intraocular lens  implant, bilateral    . Cardiac defibrillator placement  10/27/11    MDT Protecta XT VR ICD implanted by Dr Caryl Comes for VT  . Left heart catheterization with coronary angiogram N/A 12/13/2010    Procedure: LEFT HEART CATHETERIZATION WITH CORONARY ANGIOGRAM;  Surgeon: Hillary Bow, MD;  Location: [facility] CATH LAB;  Service: Cardiovascular;  Laterality: N/A;  . Right heart catheterization Right 12/13/2010    Procedure: RIGHT HEART CATH;  Surgeon: Hillary Bow, MD;  Location: [facility] CATH LAB;  Service: Cardiovascular;   Laterality: Right;  . Left heart catheterization with coronary angiogram N/A 10/24/2011    Procedure: LEFT HEART CATHETERIZATION WITH CORONARY ANGIOGRAM;  Surgeon: Thayer Headings, MD;  Location: [facility] CATH LAB;  Service: Cardiovascular;  Laterality: N/A;  . Implantable cardioverter defibrillator implant N/A 10/27/2011    Procedure: IMPLANTABLE CARDIOVERTER DEFIBRILLATOR IMPLANT;  Surgeon: Deboraha Sprang, MD;  Location: [facility] CATH LAB;  Service: Cardiovascular;  Laterality: N/A;    Current Outpatient Prescriptions  Medication Sig Dispense Refill  . allopurinol (ZYLOPRIM) 100 MG tablet Take 200 mg by mouth as needed.    Marland Kitchen amiodarone (PACERONE) 200 MG tablet Take 100 mg by mouth daily.    Marland Kitchen amLODipine (NORVASC) 10 MG tablet Take 1 tablet (10 mg total) by mouth daily. 30 tablet 2  . aspirin (ASPIR-81) 81 MG EC tablet Take 81 mg by mouth daily.      . carvedilol (COREG) 25 MG tablet Take 25 mg by mouth 2 (two) times daily with a meal.     . colchicine (COLCRYS) 0.6 MG tablet Take 0.6 mg by mouth daily as needed. For gout pain    . DULoxetine (CYMBALTA) 30 MG capsule Take 30 mg by mouth daily.      . finasteride (PROSCAR) 5 MG tablet Take 5 mg by mouth daily.    Marland Kitchen  furosemide (LASIX) 40 MG tablet Alternate 40 mg with 60 mg every other day 45 tablet 6  . HYDROcodone-acetaminophen (NORCO/VICODIN) 5-325 MG per tablet Take 1 tablet by mouth 2 (two) times daily.    Marland Kitchen lisinopril (PRINIVIL,ZESTRIL) 40 MG tablet Take 40 mg by mouth daily.      . pravastatin (PRAVACHOL) 40 MG tablet Take 40 mg by mouth daily.      . fluticasone (FLONASE) 50 MCG/ACT nasal spray Place 2 sprays into the nose daily as needed. For dry nose     No current facility-administered medications for this visit.    Physical Exam: Filed Vitals:   12/22/14 0911  BP: 118/72  Pulse: 62  Height: 6' (1.829 m)  Weight: 306 lb (138.801 kg)  SpO2: 98%    GEN- The patient is obese appearing, alert and oriented x 3 today.   Head-  normocephalic, atraumatic Eyes-  Sclera clear, conjunctiva pink Ears- hearing intact Oropharynx- clear Lungs- Clear to ausculation bilaterally, normal work of breathing Chest- ICD pocket is well healed Heart- Regular rate and rhythm, no murmurs, rubs or gallops, PMI not laterally displaced GI- soft, NT, ND, + BS Extremities- no clubbing, cyanosis, or edema Walks slowly with a cane  ICD interrogation- reviewed in detail today,  See PACEART report  Assessment and Plan:  1.  VT Normal ICD function See Claudia Desanctis Art report VT is well controlled with low dose amiodarone (no VT in 2 years) Today, I advised that we stop amiodarone.  He is clear that he would prefer to continue amiodarone. Dr Manuella Ghazi following LFTs/TFTs per patients request I think that we should have a low threshold to stop amiodarone unless he has additional VT in the future.  2. Nonischemic cardiomyopathy Stable volume status Followed  in the optivol ICM clinic He is very appreciative of the phone calls. No changes  3. HTN Stable No change required today    [facility] Return to see me in 1 year  Thompson Grayer MD, [facility] 12/22/2014 10:00 AM

## 2014-12-22 NOTE — Patient Instructions (Addendum)
Your physician recommends that you continue on your current medications as directed. Please refer to the Current Medication list given to you today. Your next device check is on 01/23/15. Your physician recommends that you schedule a follow-up appointment in: 1 year with Dr. Rayann Heman. You can schedule this appointment today or you can wait for your letter to come in the mail in about 10 months reminding you to call and schedule this appointment. If you do not receive this letter, please contact our office for your appointment.

## 2015-01-23 ENCOUNTER — Telehealth: Payer: Self-pay

## 2015-01-23 ENCOUNTER — Ambulatory Visit (INDEPENDENT_AMBULATORY_CARE_PROVIDER_SITE_OTHER): Payer: Medicare Other

## 2015-01-23 DIAGNOSIS — I5022 Chronic systolic (congestive) heart failure: Secondary | ICD-10-CM

## 2015-01-23 DIAGNOSIS — Z95810 Presence of automatic (implantable) cardiac defibrillator: Secondary | ICD-10-CM

## 2015-01-23 NOTE — Telephone Encounter (Signed)
Telephone call to PCP, Dr Trena Platt office and asked if any recent BMP results.  Tina answering phone stated the last results were from 10/30/2014 and no new lab work.

## 2015-01-23 NOTE — Telephone Encounter (Signed)
Error

## 2015-01-23 NOTE — Progress Notes (Signed)
EPIC Encounter for ICM Monitoring  Patient Name: [name] is a 75 y.o. male Date: 01/23/2015 Primary Care Physican: Monico Blitz, MD Primary Cardiologist: Domenic Polite Electrophysiologist: Allred  Dry Weight: 314 lb       In the past month, have you:  1. Gained more than 2 pounds in a day or more than 5 pounds in a week? no  2. Had changes in your medications (with verification of current medications)? no  3. Had more shortness of breath than is usual for you? no  4. Limited your activity because of shortness of breath? no  5. Not been able to sleep because of shortness of breath? no  6. Had increased swelling in your feet or ankles? no  7. Had symptoms of dehydration (dizziness, dry mouth, increased thirst, decreased urine output) no  8. Had changes in sodium restriction? no  9. Been compliant with medication? Yes   ICM trend: 1 year view  ICM Trend: 3 month view   Follow-up plan: ICM clinic phone appointment is 01/31/2015.  Optivol thoracic impedance below baseline since 01/02/2015 with a couple of days at baseline.  He denied any HF symptoms.  He reported he has been taking Furosemide as prescribed alternating 40 mg with 60 mg every other day. He reported he thinks he limits his daily intake of salt but did say he has been eating deli ham and salami, store bought pimento cheese which I explained would be high in sodium  Advised to check the labels for amount of sodium per serving and add the amount up daily to check if more than 2000 mg.  Weight remains unchanged since last ICM call.    Advised would send to Dr Domenic Polite and Dr Rayann Heman for review and will call back with any recommendations.  Last Creatinine was 1.72, BUN 38, and Potassium 5.3 on 10/30/2014.  Will repeat transmission on 01/31/2015.   Copy of note sent to patient's primary care physician, primary cardiologist, and device following physician.    [name], RN, CCM 01/23/2015 3:23 PM   Satira Sark,  MD  [name], RN            He is very difficult to manage due to his dietary/salt indiscretions. This is the real issue for him.     Received call from patient.  He wanted to inform me that he thinks his diet is causing the fluid.  He stated he has been eating 4 bags of microwave popcorn per day.  He stated he has stopped eating after realizing this could contain a lot of sodium.   Education given to limit to 2000 mg per day.  Patient reference material, Low Sodium Eating Plan mailed to patient.  No changes at this time.  Repeat transmission on 01/31/2015.

## 2015-01-31 ENCOUNTER — Ambulatory Visit (INDEPENDENT_AMBULATORY_CARE_PROVIDER_SITE_OTHER): Payer: Medicare Other

## 2015-01-31 ENCOUNTER — Telehealth: Payer: Self-pay | Admitting: *Deleted

## 2015-01-31 DIAGNOSIS — I5022 Chronic systolic (congestive) heart failure: Secondary | ICD-10-CM

## 2015-01-31 DIAGNOSIS — Z95810 Presence of automatic (implantable) cardiac defibrillator: Secondary | ICD-10-CM

## 2015-01-31 NOTE — Progress Notes (Signed)
Call to patient and notified Dr Domenic Polite has ordered to take Lasix 60mg  daily x 1 week and return to the previous prescribed dosage of 40 mg every other day alternating with 60 mg every other day.  Also order for BMET on 02/07/2015. He stated he will go to [facility] lab.  He verbalized understanding regarding labs and medication.

## 2015-01-31 NOTE — Progress Notes (Signed)
EPIC Encounter for ICM Monitoring  Patient Name: [name] is a 75 y.o. male Date: 01/31/2015 Primary Care Physican: Monico Blitz, MD Primary Cardiologist: Domenic Polite Electrophysiologist: Allred Dry Weight: 316.8 lbs       In the past month, have you:  1. Gained more than 2 pounds in a day or more than 5 pounds in a week? Yes, he reported 4-5 pound weight gain since last week.    2. Had changes in your medications (with verification of current medications)? no  3. Had more shortness of breath than is usual for you? no  4. Limited your activity because of shortness of breath? no  5. Not been able to sleep because of shortness of breath? no  6. Had increased swelling in your feet or ankles? no  7. Had symptoms of dehydration (dizziness, dry mouth, increased thirst, decreased urine output) no  8. Had changes in sodium restriction? no  9. Been compliant with medication? Yes   ICM trend: 3 month view 01/31/2015  ICM trend: 1 year view 01/31/2015   Follow-up plan: ICM clinic phone appointment on 02/07/2015.  Optivol impedance continues below baseline since last ICM call on 01/23/2015 suggesting fluid retention.  He reported 4-5 lb weight gain in last week and denied any difficulty breathing or swelling of lower extremities.  Discussed diet and sodium limits.  Patient reviewed some of the foods he eats such as regular peanut butter, oscar mayer salami (4 slices), [id number] saltines a day.  He stated since last week he has stopped eating the daily 4 bags of microwave popcorn and pork skins.  Researched the daily current foods he is eating (salami, saltines and peanut butter) for sodium amounts and explained 1 slice of salami is 99991111 mg salt x 4 = 2,016 mg daily and 5 saltines of crackers = 150 mg and 2 servings would be 300 mg daily.  Education given the 2,016 mg sodium in the salami meets his daily limit.  Advised he is probably consuming twice the amount or more that is allowed.  He stated he  drinks a gallon of fluid daily and explained the recommendation is 64 oz a day.  Education given too much salt and fluid increases the fluid retention which makes his heart work harder to eliminate the extra fluid.  He stated he will try to cut back some more.  Will continue to educate patient on sodium content of foods.   Advise would send to Dr Domenic Polite and Dr Rayann Heman and if any recommendations, will call him back.  Patient has gained 4-5 lbs in last week.  Confirmed he is taking Furosemide 40 mg every other day, alternating with 60 mg every other day.   Will repeat transmission on 02/07/2015.    Copy of note sent to patient's primary care physician, primary cardiologist, and device following physician.  [name], RN, CCM 01/31/2015 11:32 AM

## 2015-01-31 NOTE — Telephone Encounter (Signed)
Satira Sark, MD at 01/31/2015 12:17 PM     Status: Sign at close encounter       Expand All Collapse All   Noted. Since he has actually demonstrated an increase in weight of 4-5 pounds, would recommend advancing Lasix to 60 mg daily for the next week, then return to previous dose. He should have a follow-up BMET as well. Reinforced sodium and fluid restriction guidelines as this seems to be the major issue for him.

## 2015-01-31 NOTE — Progress Notes (Signed)
Noted. Since he has actually demonstrated an increase in weight of 4-5 pounds, would recommend advancing Lasix to 60 mg daily for the next week, then return to previous dose. He should have a follow-up BMET as well. Reinforced sodium and fluid restriction guidelines as this seems to be the major issue for him.

## 2015-02-01 NOTE — Addendum Note (Signed)
Addended by: [name] on: 02/01/2015 09:17 AM   Modules accepted: Orders

## 2015-02-01 NOTE — Telephone Encounter (Signed)
Patient informed and and says that he was already informed of the below information by Sharman Cheek. Patient is aware to have blood work done on 02/07/15.

## 2015-02-02 ENCOUNTER — Telehealth: Payer: Self-pay

## 2015-02-02 NOTE — Telephone Encounter (Signed)
Telephone call to patient. He stated he was having a lot of stomach gas today and asked if that could be related to taking extra Lasix.  I stated the extra 20 mg he is taking every other day should not cause gas.  He stated he went to Dr Janyth Pupa office today because of the gas and waiting for the nurse to call in a script.  Advised the PCP is the best one to address the gas issue.  He stated that was all he wanted.    Received voice mail message from patient requesting a call back.

## 2015-02-07 ENCOUNTER — Ambulatory Visit (INDEPENDENT_AMBULATORY_CARE_PROVIDER_SITE_OTHER): Payer: Medicare Other

## 2015-02-07 DIAGNOSIS — Z95810 Presence of automatic (implantable) cardiac defibrillator: Secondary | ICD-10-CM

## 2015-02-07 DIAGNOSIS — I5022 Chronic systolic (congestive) heart failure: Secondary | ICD-10-CM

## 2015-02-08 NOTE — Progress Notes (Signed)
EPIC Encounter for ICM Monitoring  Patient Name: [name] is a 75 y.o. male Date: 02/08/2015 Primary Care Physican: Monico Blitz, MD Primary Cardiologist: Domenic Polite Electrophysiologist: Allred Dry Weight: 311 lbs       In the past month, have you:  1. Gained more than 2 pounds in a day or more than 5 pounds in a week? no  2. Had changes in your medications (with verification of current medications)? no  3. Had more shortness of breath than is usual for you? no  4. Limited your activity because of shortness of breath? no  5. Not been able to sleep because of shortness of breath? no  6. Had increased swelling in your feet or ankles? no  7. Had symptoms of dehydration (dizziness, dry mouth, increased thirst, decreased urine output) no  8. Had changes in sodium restriction? no  9. Been compliant with medication? Yes   ICM trend: 1 year view for 02/07/2015  ICM trend: 3 month view for 02/07/2015    Follow-up plan: ICM clinic phone appointment on 03/12/2015.  Optivol thoracic impedance returned back to reference line after 5 days of increased Lasix.  He lost 5 lbs and weight has returned to his baseline.    Reviewed 02/07/2015 lab results with patient.  Recommended he follow low salt diet and explained salt increases the fluid retention.  He stated he has cut back on how much he is drinking because he was drinking too much.  Confirmed he has resumed his previous Furosemide dosage of alternating 60 mg every other day with 40 mg every other day.   No changes today.  Encouraged to call should he experience any HF symptoms.     Copy of note sent to patient's primary care physician, primary cardiologist, and device following physician.  [name], RN, CCM 02/08/2015 9:15 AM

## 2015-03-12 ENCOUNTER — Telehealth: Payer: Self-pay | Admitting: Cardiology

## 2015-03-12 ENCOUNTER — Ambulatory Visit (INDEPENDENT_AMBULATORY_CARE_PROVIDER_SITE_OTHER): Payer: Medicare Other

## 2015-03-12 DIAGNOSIS — I5022 Chronic systolic (congestive) heart failure: Secondary | ICD-10-CM

## 2015-03-12 DIAGNOSIS — Z95810 Presence of automatic (implantable) cardiac defibrillator: Secondary | ICD-10-CM

## 2015-03-12 NOTE — Telephone Encounter (Signed)
Attempted to confirm remote transmission with pt. No answer and was unable to leave a message.   

## 2015-03-13 ENCOUNTER — Telehealth: Payer: Self-pay

## 2015-03-13 NOTE — Telephone Encounter (Signed)
Remote ICM transmission received.  Attempted patient call and no answer or answering machine.  

## 2015-03-16 NOTE — Progress Notes (Signed)
EPIC Encounter for ICM Monitoring  Patient Name: [name] is a 75 y.o. male Date: 03/16/2015 Primary Care Physican: Monico Blitz, MD Primary Cardiologist: Domenic Polite Electrophysiologist: Allred Dry Weight: 298 lbs       In the past month, have you:  1. Gained more than 2 pounds in a day or more than 5 pounds in a week? no  2. Had changes in your medications (with verification of current medications)? no  3. Had more shortness of breath than is usual for you? no  4. Limited your activity because of shortness of breath? no  5. Not been able to sleep because of shortness of breath? no  6. Had increased swelling in your feet or ankles? no  7. Had symptoms of dehydration (dizziness, dry mouth, increased thirst, decreased urine output) no  8. Had changes in sodium restriction? no  9. Been compliant with medication? Yes   ICM trend: 3 month view for 03/12/2015 transmission    ICM trend: 1 year view for 03/12/2015 transmission   Follow-up plan: ICM clinic phone appointment on 03/26/2015.  Optivol thoracic impedance below reference line since 03/03/2015 suggesting fluid accumulation.  Patient is currently in [facility] and the Furosemide was decreased to 40 mg daily.  Patient is very concerned he will have fluid overload during the his stay.  He asked me to speak with the nurse to inform her of the previous Furosemide dosage and current device readings.  Attempted to inform the nurse but she said I would need to provide her with an order to change anything. I explained I understood but was trying to provide her information.  Patient asked for Dr Rayann Heman or Dr Domenic Polite to call the [facility] to talk with the physician at the facility and number is [phone].  He stated he is always [name] of breath.  Denied any swelling.    Advised will send information, from 03/12/2015 transmission, to Dr Domenic Polite and Dr Rayann Heman for review and that if there are any recommendations to call the  [facility] [phone]) for changes.    Copy of note sent to patient's primary care physician, primary cardiologist, and device following physician.  [name], RN, CCM 03/16/2015 3:07 PM   Satira Sark, MD   Sent: Fri March 16, 2015 3:30 PM    To: [name], RN        Message     Based on his propensity fluid overload, would probably not reduce baseline Lasix dose. They may have reduced it based on renal insufficiency, I do not have the details. I would simply ask that this be followed very closely.

## 2015-03-23 NOTE — Telephone Encounter (Signed)
Spoke with patient.

## 2015-03-26 ENCOUNTER — Encounter: Payer: Medicare Other | Admitting: *Deleted

## 2015-03-30 ENCOUNTER — Encounter: Payer: Self-pay | Admitting: Cardiology

## 2015-03-30 NOTE — Progress Notes (Signed)
No ICM remote transmission sent on 03/26/2015.  Scheduled next remote ICM transmission for 04/18/2015.

## 2015-04-06 ENCOUNTER — Ambulatory Visit (INDEPENDENT_AMBULATORY_CARE_PROVIDER_SITE_OTHER): Payer: Medicare Other | Admitting: *Deleted

## 2015-04-06 DIAGNOSIS — I5022 Chronic systolic (congestive) heart failure: Secondary | ICD-10-CM

## 2015-04-06 DIAGNOSIS — Z95810 Presence of automatic (implantable) cardiac defibrillator: Secondary | ICD-10-CM

## 2015-04-06 DIAGNOSIS — I472 Ventricular tachycardia, unspecified: Secondary | ICD-10-CM

## 2015-04-06 DIAGNOSIS — I428 Other cardiomyopathies: Secondary | ICD-10-CM

## 2015-04-06 DIAGNOSIS — I429 Cardiomyopathy, unspecified: Secondary | ICD-10-CM | POA: Diagnosis not present

## 2015-04-09 NOTE — Progress Notes (Signed)
EPIC Encounter for ICM Monitoring  Patient Name: [name] is a 75 y.o. male Date: 04/09/2015 Primary Care Physican: Monico Blitz, MD Primary Cardiologist: Domenic Polite Electrophysiologist: Allred Dry Weight: unknown   In the past month, have you:  1. Gained more than 2 pounds in a day or more than 5 pounds in a week? No  2. Had changes in your medications (with verification of current medications)? No  3. Had more shortness of breath than is usual for you? No  4. Limited your activity because of shortness of breath? No  5. Not been able to sleep because of shortness of breath? No  6. Had increased swelling in your feet or ankles? No  7. Had symptoms of dehydration (dizziness, dry mouth, increased thirst, decreased urine output) No  8. Had changes in sodium restriction? No  9. Been compliant with medication? No   ICM trend: 3 month view for 04/06/2015   ICM trend: 1 year view for 04/06/2015   Follow-up plan: ICM clinic phone appointment on  04/18/2015.    Thoracic impedance below reference line from 03/04/2015 to 04/06/2015 suggesting fluid accumulation and did return to reference line x 3 days during that time.  Patient stated he has been on a low sodium diet since enter [facility] in [place] but some of the choice they give him are not always low sodium.  He has at least 2 more weeks at the center.  Patient stated he continues to received Furosemide 40 mg daily but he was on on Furosemide 40 mg alternating with 60 mg every other day.   Transmission reviewed with Dr Rayann Heman.  He ordered Furosemide 40 mg alternating with 60 mg every other day and BMET in a week.  Repeat transmission on 04/18/2015.  Call to [facility], [phone] and spoke with patient's nurse, Martie Lee and stated Dr Rayann Heman ordered Furosemide 40 mg alternating with 60 mg every other day and BMET in a week.  Requested BMET result to be faxed to [phone]. She verbalized understanding and read back orders.   Spoke  with patient and advised that Dr Rayann Heman has changed the Furosemide 40 mg alternating 60 mg every other day which he was taking prior to entering [facility].   Also advised BMET will be drawn in a week.   Copy of note sent to patient's primary care physician, primary cardiologist, and device following physician.  [name], RN, CCM 04/09/2015 10:30 AM

## 2015-04-10 NOTE — Progress Notes (Signed)
Remote ICD transmission.   

## 2015-04-18 ENCOUNTER — Encounter: Payer: Self-pay | Admitting: Internal Medicine

## 2015-04-18 ENCOUNTER — Ambulatory Visit (INDEPENDENT_AMBULATORY_CARE_PROVIDER_SITE_OTHER): Payer: Medicare Other

## 2015-04-18 DIAGNOSIS — Z95810 Presence of automatic (implantable) cardiac defibrillator: Secondary | ICD-10-CM

## 2015-04-18 DIAGNOSIS — I5022 Chronic systolic (congestive) heart failure: Secondary | ICD-10-CM

## 2015-04-18 NOTE — Progress Notes (Signed)
EPIC Encounter for ICM Monitoring  Patient Name: [name] is a 75 y.o. male Date: 04/18/2015 Primary Care Physican: Monico Blitz, MD Primary Cardiologist: Domenic Polite Electrophysiologist: Allred Dry Weight: unknown   In the past month, have you:  1. Gained more than 2 pounds in a day or more than 5 pounds in a week? unknown  2. Had changes in your medications (with verification of current medications)? no  3. Had more shortness of breath than is usual for you? no  4. Limited your activity because of shortness of breath? no  5. Not been able to sleep because of shortness of breath? no  6. Had increased swelling in your feet or ankles? no  7. Had symptoms of dehydration (dizziness, dry mouth, increased thirst, decreased urine output) no  8. Had changes in sodium restriction? no  9. Been compliant with medication? Yes   ICM trend: 3 month view for 04/18/2015   ICM trend: 1 year view for 04/18/2015   Follow-up plan: ICM clinic phone appointment on 05/22/2015.  Thoracic impedance above reference line from 04/12/2015 to 04/18/2015 suggesting stable fluid levels and improvement since last ICM remote on 04/06/2015.  Furosemide was increased 04/06/2015.  Patient denied any fluid symptoms.   He stated he is feeling fine and getting stronger.  Education given to limit sodium intake to < 2000 mg and fluid intake to 64 oz daily.  Encouraged to call for any fluid symptoms.  No changes today.    Received faxed copy of BMP results from [facility].  Potassium 5.8 from slightly hemolyze lab specimen, Creatinine 1.6.    Spoke with patients nurse, Rosalee and case manager, Jocelyn Lamer regarding patient.  They both stated patient is doing well and strength is improve.  Advised received copy of BMET and will review with Dr Rayann Heman.  She stated a copy was sent to PCP, Dr Manuella Ghazi as well.    Call back to [place], Therapist, sports at [facility].  Advised Dr Rayann Heman ordered BMP for tomorrow, 04/19/2015.  Also patient should  follow low potassium diet.  Advised to send copy of lab results to [phone] and will review with Dr Rayann Heman.    Copy of note sent to patient's primary care physician, primary cardiologist, and device following physician.  [name], RN, CCM 04/18/2015 12:25 PM

## 2015-04-20 ENCOUNTER — Encounter: Payer: Self-pay | Admitting: Internal Medicine

## 2015-04-20 MED ORDER — FUROSEMIDE 40 MG PO TABS: 40.0000 mg | ORAL_TABLET | Freq: Every day | ORAL | Status: DC

## 2015-04-20 NOTE — Addendum Note (Signed)
Addended by: [name] on: 04/20/2015 01:00 PM   Modules accepted: Orders, Medications

## 2015-04-20 NOTE — Progress Notes (Addendum)
Reviewed 04/19/2015 labs with Dr Rayann Heman.  Dr Rayann Heman consulted with PCP Dr Manuella Ghazi and will refer patient to Nephrologist, Dr Lowanda Foster in [place].  Will contact patient regarding referral.  Dr Rayann Heman ordered to decrease Furosemide to 40 mg daily.

## 2015-04-20 NOTE — Progress Notes (Addendum)
Call to patient at [facility] at [phone].  Spoke with patient and advised of Dr Jackalyn Lombard orders for decreasing Furosemide to 40 mg daily and referral to nephrologist due to high Creatinine and BUN which is used to monitor kidneys.  Advised to avoid high potassium foods and reviewed food list.  Advised would make a referral to Dr Lowanda Foster, Nephrologist.

## 2015-04-20 NOTE — Progress Notes (Signed)
Received faxed copy of lab results drawn on 04/19/2015 from [facility].   BUN 63 Creatinine 2.45 eGFR 32 Calcium 8.9 Sodium 137 Potassium 5.5 Choloride 96 Co2 26.8 Anion GAP 18

## 2015-04-20 NOTE — Progress Notes (Addendum)
Call to [facility] at [phone] and spoke with patient's nurse, Arlis Porta.  Advised of Dr Jackalyn Lombard order to decrease Furosemide to 40 mg daily due to increase in Creatinine.  Advised for patient to continue to avoid high potassium foods.  She stated she did not know what foods are high potassium and requested a list be faxed to [phone].  Faxed copy of Potassium content of foods.

## 2015-04-23 NOTE — Progress Notes (Signed)
Message sent to Dr Domenic Polite providing update on labs and nephrology referral.    Satira Sark, MD  [name], RN            Thank you for the update Margarita Grizzle.       Previous Messages

## 2015-04-26 LAB — CUP PACEART REMOTE DEVICE CHECK
Date Time Interrogation Session: 20170317161203
HighPow Impedance: 342 Ohm
HighPow Impedance: 50 Ohm
HighPow Impedance: 60 Ohm
Implantable Lead Model: 7121
Lead Channel Sensing Intrinsic Amplitude: 14.75 mV
Lead Channel Setting Pacing Pulse Width: 0.4 ms
Lead Channel Setting Sensing Sensitivity: 0.3 mV
MDC IDC LEAD IMPLANT DT: 20131007
MDC IDC LEAD LOCATION: 753860
MDC IDC MSMT BATTERY VOLTAGE: 3.13 V
MDC IDC MSMT LEADCHNL RV IMPEDANCE VALUE: 456 Ohm
MDC IDC MSMT LEADCHNL RV PACING THRESHOLD AMPLITUDE: 0.625 V
MDC IDC MSMT LEADCHNL RV PACING THRESHOLD PULSEWIDTH: 0.4 ms
MDC IDC MSMT LEADCHNL RV SENSING INTR AMPL: 14.75 mV
MDC IDC SET LEADCHNL RV PACING AMPLITUDE: 2.5 V
MDC IDC STAT BRADY RV PERCENT PACED: 0.01 %

## 2015-04-30 ENCOUNTER — Ambulatory Visit (INDEPENDENT_AMBULATORY_CARE_PROVIDER_SITE_OTHER): Payer: Medicare Other

## 2015-04-30 DIAGNOSIS — Z95810 Presence of automatic (implantable) cardiac defibrillator: Secondary | ICD-10-CM

## 2015-04-30 DIAGNOSIS — I5022 Chronic systolic (congestive) heart failure: Secondary | ICD-10-CM

## 2015-04-30 NOTE — Progress Notes (Signed)
Call to [facility], RN at [facility].  Advised Dr Domenic Polite ordered the Lasix to be stopped and ordered Demadex 40 mg daily.  She verbalized understanding.   Asked if patient's nephrologist appointment can be move to earlier appointment.  She reported Inez Catalina at the center makes the appointments and provides transportation and that was the earliest appointment that was available.  I stated I would try to contact Dr Florentina Addison office in [place] to request earlier appointment.

## 2015-04-30 NOTE — Progress Notes (Signed)
Call to Dr Florentina Addison office and spoke with Erline Levine.  Advised Dr Domenic Polite would like for patient to have an earlier appointment than May due to progressive renal insufficiency.  She stated she has an appointment 05/15/2015 at 10:00 am and she will call Inez Catalina at the [facility] to coordinate transportation.

## 2015-04-30 NOTE — Progress Notes (Signed)
EPIC Encounter for ICM Monitoring  Patient Name: [name] is a 75 y.o. male Date: 04/30/2015 Primary Care Physican: Monico Blitz, MD Primary Cardiologist: Domenic Polite Electrophysiologist: Allred Dry Weight: 310 lbs   In the past month, have you:  1. Gained more than 2 pounds in a day or more than 5 pounds in a week? Yes, 8-9 lbs in last week.  Baseline weight is 300 lbs  2. Had changes in your medications (with verification of current medications)? no  3. Had more shortness of breath than is usual for you? no  4. Limited your activity because of shortness of breath? no  5. Not been able to sleep because of shortness of breath? no  6. Had increased swelling in your feet or ankles? no  7. Had symptoms of dehydration (dizziness, dry mouth, increased thirst, decreased urine output) no  8. Had changes in sodium restriction? no  9. Been compliant with medication? Yes   ICM trend: 3 month view for 04/30/2015   ICM trend: 1 year view for 04/30/2015   Follow-up plan: ICM clinic phone appointment on 05/04/2015.   Received call from patient reporting weight gain and wanted to send a transmission to check his fluid levels.  He is still in [facility] for rehab.  Thoracic impedance below reference line from 04/18/2015 to 04/30/2015 suggesting fluid accumulation.  Patient has gained 8-9 lbs in the last week.  He denied other fluid symptoms.  Asked if the referral appointment with the Nephrologist has been made and he stated was 06/16/2015.   Advised would need to check with physician for recommendation.  04/20/2015 Creatinine 2.45, BUN 63, Potassium 5.5  04/16/2015 Creatinine 1.66, BUN 29, Potassium 5.8 10/30/2014 Creatinine 1.72, BUN 38, Potassium 5.3        Patient currently taking Furosemide 40 mg daily (was decreased on 04/20/2015 from 60 mg alternating with 40 mg every other day)        [facility] phone number [phone] to reach patient and nurses for any orders.   Advised would  send to Dr Rayann Heman and Dr Domenic Polite for review and recommendation for 8-9 weight gain in the last week.  1st Nephrologist appointment 06/16/2015.  Copy of note sent to patient's primary care physician, primary cardiologist, and device following physician.  [name], RN, CCM 04/30/2015 12:57 PM

## 2015-04-30 NOTE — Progress Notes (Signed)
Noted. Lasix was last decreased to 40 mg daily by Dr. Rayann Heman based on progressive renal insufficiency. With further weight gain however, we need to either go back up on Lasix or consider switching to [facility] which may have better bioavailability. My recommendation at this time would be to switch from Lasix to Demadex 40 mg daily, and also see if the patient's nephrology office can be contacted to move up his visit to an earlier appointment.

## 2015-05-01 ENCOUNTER — Encounter: Payer: Self-pay | Admitting: Cardiology

## 2015-05-18 ENCOUNTER — Ambulatory Visit (INDEPENDENT_AMBULATORY_CARE_PROVIDER_SITE_OTHER): Payer: Medicare Other

## 2015-05-18 ENCOUNTER — Telehealth: Payer: Self-pay

## 2015-05-18 DIAGNOSIS — I5022 Chronic systolic (congestive) heart failure: Secondary | ICD-10-CM

## 2015-05-18 DIAGNOSIS — Z95810 Presence of automatic (implantable) cardiac defibrillator: Secondary | ICD-10-CM

## 2015-05-18 NOTE — Telephone Encounter (Signed)
Returned call to patient as requested at [facility] in [place], [phone].   Patient stated he has gained 4 lbs in last week and wanted to check his fluid levels.   He denied any other fluid symptoms.  Asked him to send a remote transmission today and will review.

## 2015-05-18 NOTE — Progress Notes (Signed)
EPIC Encounter for ICM Monitoring  Patient Name: [name] is a 75 y.o. male Date: 05/18/2015 Primary Care Physican: Monico Blitz, MD Primary Cardiologist: Domenic Polite Electrophysiologist: Allred Dry Weight: 314 lbs    In the past month, have you:  1. Gained more than 2 pounds in a day or more than 5 pounds in a week? Yes, 4 lbs in the last few weeks  2. Had changes in your medications (with verification of current medications)? no  3. Had more shortness of breath than is usual for you? no  4. Limited your activity because of shortness of breath? no  5. Not been able to sleep because of shortness of breath? no  6. Had increased swelling in your feet or ankles? no  7. Had symptoms of dehydration (dizziness, dry mouth, increased thirst, decreased urine output) no  8. Had changes in sodium restriction? no  9. Been compliant with medication? Yes  ICM trend: 3 month view for 05/18/2015  ICM trend: 1 year view for 05/18/2015   Follow-up plan: ICM clinic phone appointment 06/21/2015.  Patient called requesting to send a transmission because he has gained some weight.   Patient continues to be inpatient at [facility] in [place].   FLUID LEVELS: Optivol thoracic impedance decreased 04/21/2015 to 05/11/2015 suggesting fluid accumulation and returned to baseline 05/11/2015.     SYMPTOMS: He reported weight gain in the last couple of weeks but thinks it may be because he is eating more.  He had appointment with nephrologist on 05/15/2015 and no changes made with medications.  He has another appointment in 6 weeks for follow up.  Encouraged to call for any fluid symptoms.  EDUCATION:  Reminded to limit sodium intake to < 2000 mg and fluid intake to 64 oz daily.   No changes today.     [name], RN, CCM 05/18/2015 2:16 PM

## 2015-06-21 ENCOUNTER — Ambulatory Visit (INDEPENDENT_AMBULATORY_CARE_PROVIDER_SITE_OTHER): Payer: Medicare Other

## 2015-06-21 DIAGNOSIS — Z95810 Presence of automatic (implantable) cardiac defibrillator: Secondary | ICD-10-CM | POA: Diagnosis not present

## 2015-06-21 DIAGNOSIS — I5022 Chronic systolic (congestive) heart failure: Secondary | ICD-10-CM

## 2015-06-21 NOTE — Progress Notes (Signed)
EPIC Encounter for ICM Monitoring  Patient Name: [name] is a 75 y.o. male Date: 06/21/2015 Primary Care Physican: Monico Blitz, MD Primary Cardiologist: Domenic Polite Electrophysiologist: Allred Dry Weight: 308 lbs     In the past month, have you:  1. Gained more than 2 pounds in a day or more than 5 pounds in a week? no  2. Had changes in your medications (with verification of current medications)? no  3. Had more shortness of breath than is usual for you? no  4. Limited your activity because of shortness of breath? no  5. Not been able to sleep because of shortness of breath? no  6. Had increased swelling in your feet, ankles, legs or stomach area? no  7. Had symptoms of dehydration (dizziness, dry mouth, increased thirst, decreased urine output) no  8. Had changes in sodium restriction? no  9. Been compliant with medication? Yes  ICM trend: 3 month view for 06/21/2015   ICM trend: 1 year view for 06/21/2015   Follow-up plan: ICM clinic phone appointment 07/26/2015.    FLUID LEVELS:  Optivol thoracic impedance trending close to baseline suggesting stable fluid levels.    SYMPTOMS:  He stated he is doing well at this time.  He has been back to kidney MD and no changes at the appointment.   Denied any symptoms such as weight gain of 3 pounds overnight or 5 pounds within a week, SOB and/or lower extremity swelling.  Encouraged to call for any fluid symptoms.   He continues to be inpatient at [facility] center, [phone].     EDUCATION: Limit sodium intake to < 2000 mg and fluid intake to 64 oz daily.     RECOMMENDATIONS: No changes today.    [name], RN, CCM 06/21/2015 12:22 PM

## 2015-07-26 ENCOUNTER — Ambulatory Visit (INDEPENDENT_AMBULATORY_CARE_PROVIDER_SITE_OTHER): Payer: Medicare Other | Admitting: *Deleted

## 2015-07-26 DIAGNOSIS — I429 Cardiomyopathy, unspecified: Secondary | ICD-10-CM

## 2015-07-26 DIAGNOSIS — I428 Other cardiomyopathies: Secondary | ICD-10-CM

## 2015-07-26 DIAGNOSIS — I472 Ventricular tachycardia, unspecified: Secondary | ICD-10-CM

## 2015-07-26 DIAGNOSIS — Z95810 Presence of automatic (implantable) cardiac defibrillator: Secondary | ICD-10-CM

## 2015-07-26 DIAGNOSIS — I5022 Chronic systolic (congestive) heart failure: Secondary | ICD-10-CM | POA: Diagnosis not present

## 2015-07-26 NOTE — Progress Notes (Signed)
Remote ICD transmission.   

## 2015-07-26 NOTE — Progress Notes (Signed)
EPIC Encounter for ICM Monitoring  Patient Name: [name] is a 75 y.o. male Date: 07/26/2015 Primary Care Physican: Monico Blitz, MD Primary Cardiologist: Domenic Polite Electrophysiologist: Allred Dry Weight: unknown        Heart Failure questions reviewed, pt asymptomatic.   Thoracic impedence returning to baseline 07/26/2015.  Impedance had been below baseline 07/17/2015 to 07/25/2015 and he reported drinking too much fluids.   Low sodium diet education provided Patient continues to be inpatient at [facility] in [place], phone [phone]  ICM trend: 07/26/2015    Follow-up plan: ICM clinic phone appointment on 08/29/2015.  Office appointment with Dr Domenic Polite 08/03/2015.   Copy of ICM check sent to primary cardiologist and device physician.   [name], RN 07/26/2015 1:19 PM

## 2015-07-31 LAB — CUP PACEART REMOTE DEVICE CHECK
Battery Voltage: 3.13 V
Brady Statistic RV Percent Paced: 0.04 %
HIGH POWER IMPEDANCE MEASURED VALUE: 304 Ohm
HIGH POWER IMPEDANCE MEASURED VALUE: 60 Ohm
HighPow Impedance: 47 Ohm
Implantable Lead Location: 753860
Implantable Lead Model: 7121
Lead Channel Impedance Value: 418 Ohm
Lead Channel Pacing Threshold Amplitude: 0.75 V
Lead Channel Pacing Threshold Pulse Width: 0.4 ms
Lead Channel Sensing Intrinsic Amplitude: 11.625 mV
Lead Channel Setting Pacing Amplitude: 2.5 V
MDC IDC LEAD IMPLANT DT: 20131007
MDC IDC MSMT LEADCHNL RV SENSING INTR AMPL: 11.625 mV
MDC IDC SESS DTM: 20170705151356
MDC IDC SET LEADCHNL RV PACING PULSEWIDTH: 0.4 ms
MDC IDC SET LEADCHNL RV SENSING SENSITIVITY: 0.3 mV

## 2015-08-01 ENCOUNTER — Encounter: Payer: Self-pay | Admitting: Cardiology

## 2015-08-03 ENCOUNTER — Ambulatory Visit (INDEPENDENT_AMBULATORY_CARE_PROVIDER_SITE_OTHER): Payer: Medicare Other | Admitting: Cardiology

## 2015-08-03 ENCOUNTER — Encounter: Payer: Self-pay | Admitting: Cardiology

## 2015-08-03 VITALS — BP 95/58 | HR 59 | Ht 72.0 in | Wt 309.0 lb

## 2015-08-03 DIAGNOSIS — I5022 Chronic systolic (congestive) heart failure: Secondary | ICD-10-CM

## 2015-08-03 DIAGNOSIS — I429 Cardiomyopathy, unspecified: Secondary | ICD-10-CM

## 2015-08-03 DIAGNOSIS — Z79899 Other long term (current) drug therapy: Secondary | ICD-10-CM | POA: Diagnosis not present

## 2015-08-03 DIAGNOSIS — Z5181 Encounter for therapeutic drug level monitoring: Secondary | ICD-10-CM | POA: Diagnosis not present

## 2015-08-03 DIAGNOSIS — I428 Other cardiomyopathies: Secondary | ICD-10-CM

## 2015-08-03 DIAGNOSIS — I472 Ventricular tachycardia, unspecified: Secondary | ICD-10-CM

## 2015-08-03 NOTE — Progress Notes (Signed)
Cardiology Office Note  Date: 08/03/2015   ID: [name], DOB 09/23/1940, MRN [id number]  PCP: Monico Blitz, MD  Primary Cardiologist: Rozann Lesches, MD   Chief Complaint  Patient presents with  . Cardiomyopathy    History of Present Illness: [name] is a 75 y.o. male last seen in August 2016. He presents for a follow-up visit, missed his last scheduled appointment. He is a [facility] at this time undergoing rehabilitation. Since I last saw him his weight is down nearly 30 pounds. I went over his medical regimen which is outlined below.  He continues to follow in the device clinic with Dr. Rayann Heman. Recent thoracic impedance measurements noted. I see that Dr. Rayann Heman recommended stopping amiodarone since he had had no VT in the last 2 years but Mr. Rimer was hesitant to do so. He is on amiodarone at 100 mg daily. He has not had recent TSH or LFTs.  He has CKD stage 3-4, following with a nephrologist at this time. Last creatinine was 2.4.  He reports no chest pain, palpitations, or device shocks.  Past Medical History  Diagnosis Date  . Nonischemic dilated cardiomyopathy (HCC)     EF 35-40%, Cath 10/4 showed normal coronaries, EF 40-45%  . CKD (chronic kidney disease), stage II   . Essential hypertension, benign   . Obstructive sleep apnea   . Dyslipidemia   . Type 2 diabetes mellitus ([place])   . DVT of upper extremity (deep vein thrombosis) ([place]) 2012    Right arm  . Depression   . Arthritis   . Gout     Right foot  . Ventricular tachycardia [facility])     s/p Medtronic single chamber ICD 10/27/11    Past Surgical History  Procedure Laterality Date  . Bunionectomy      Right foot  . Carpal tunnel release      Right hand  . Joint replacement    . Total hip arthroplasty      Left  . Total knee arthroplasty      Right  . Appendectomy  1961  . Tonsillectomy and adenoidectomy  ~ 1950  . Cataract extraction w/ intraocular lens  implant,  bilateral    . Cardiac defibrillator placement  10/27/11    MDT Protecta XT VR ICD implanted by Dr Caryl Comes for VT  . Left heart catheterization with coronary angiogram N/A 12/13/2010    Procedure: LEFT HEART CATHETERIZATION WITH CORONARY ANGIOGRAM;  Surgeon: Hillary Bow, MD;  Location: [facility] CATH LAB;  Service: Cardiovascular;  Laterality: N/A;  . Right heart catheterization Right 12/13/2010    Procedure: RIGHT HEART CATH;  Surgeon: Hillary Bow, MD;  Location: [facility] CATH LAB;  Service: Cardiovascular;  Laterality: Right;  . Left heart catheterization with coronary angiogram N/A 10/24/2011    Procedure: LEFT HEART CATHETERIZATION WITH CORONARY ANGIOGRAM;  Surgeon: Thayer Headings, MD;  Location: [facility] CATH LAB;  Service: Cardiovascular;  Laterality: N/A;  . Implantable cardioverter defibrillator implant N/A 10/27/2011    Procedure: IMPLANTABLE CARDIOVERTER DEFIBRILLATOR IMPLANT;  Surgeon: Deboraha Sprang, MD;  Location: [facility] CATH LAB;  Service: Cardiovascular;  Laterality: N/A;    Current Outpatient Prescriptions  Medication Sig Dispense Refill  . acetaminophen (TYLENOL) 500 MG tablet Take 500 mg by mouth every 6 (six) hours as needed.    Marland Kitchen amiodarone (PACERONE) 200 MG tablet Take 100 mg by mouth daily.    Marland Kitchen amLODipine (NORVASC) 10 MG tablet Take 1 tablet (  10 mg total) by mouth daily. 30 tablet 2  . aspirin (ASPIR-81) 81 MG EC tablet Take 81 mg by mouth daily.      . carvedilol (COREG) 25 MG tablet Take 25 mg by mouth 2 (two) times daily with a meal.     . Dextromethorphan-Guaifenesin (ROBITUSSIN DM) 10-100 MG/5ML liquid Take 10 mLs by mouth every 4 (four) hours as needed (cough).    . diphenhydrAMINE (BENADRYL) 25 MG tablet Take 50 mg by mouth every 4 (four) hours as needed for itching.    . DULoxetine (CYMBALTA) 30 MG capsule Take 30 mg by mouth daily.      . fluticasone (FLONASE) 50 MCG/ACT nasal spray Place 2 sprays into the nose daily as needed. For dry nose    . HYDROcodone-acetaminophen  (NORCO/VICODIN) 5-325 MG per tablet Take 1 tablet by mouth 2 (two) times daily.    Marland Kitchen lisinopril (PRINIVIL,ZESTRIL) 40 MG tablet Take 40 mg by mouth daily.      . tamsulosin (FLOMAX) 0.4 MG CAPS capsule Take 0.4 mg by mouth daily.    Marland Kitchen torsemide (DEMADEX) 20 MG tablet Take 40 mg by mouth every morning.     No current facility-administered medications for this visit.   Allergies:  Codeine; Milk-related compounds; and Warfarin sodium   Social History: The patient  reports that he quit smoking about 33 years ago. His smoking use included Cigarettes. He has a 15 pack-year smoking history. He has never used smokeless tobacco. He reports that he does not drink alcohol or use illicit drugs.   ROS:  Please see the history of present illness. Otherwise, complete review of systems is positive for recent gout flare.  All other systems are reviewed and negative.   Physical Exam: VS:  BP 95/58 mmHg  Pulse 59  Ht 6' (1.829 m)  Wt 309 lb (140.161 kg)  BMI 41.90 kg/m2  SpO2 96%, BMI Body mass index is 41.9 kg/(m^2).  Wt Readings from Last 3 Encounters:  08/03/15 309 lb (140.161 kg)  12/22/14 306 lb (138.801 kg)  08/21/14 327 lb (148.326 kg)    Morbidly obese male in no acute distress. Seated in wheelchair. HEENT: Conjunctiva and lids normal, oropharynx clear.  Neck: Supple, no obvious elevated JVP with increased girth, no carotid bruits, no thyromegaly.  Lungs: Clear to auscultation, diminished throughout, nonlabored breathing at rest.  Thorax: Stable device pocket site.  Cardiac: Regular rate and rhythm, no S3, distant heart sounds, no pericardial rub.  Abdomen: Soft, nontender, bowel sounds present.  Extremities: No pitting edema, distal pulses 2+.   ECG: I personally reviewed the tracing from 08/21/2014 showed sinus bradycardia with prolonged PR interval and PVCs, IVCD as well.  Recent Labwork:  March 2017: BUN 63, creatinine 2.5, potassium 5.5  Other Studies Reviewed  Today:  Echocardiogram 03/17/2012: Study Conclusions  - Left ventricle: The cavity size was moderately dilated. Wall thickness was increased in a pattern of mild LVH. Systolic function was mildly to moderately reduced. The estimated ejection fraction was in the range of 40% to 45%. Diffuse hypokinesis - most prominent, nearly akineticin basal inferoposterior wall. Doppler parameters are consistent with abnormal left ventricular relaxation (grade 1 diastolic dysfunction). Unable to compare with previous study. - Mitral valve: Mild regurgitation. - Left atrium: The atrium was mildly to moderately dilated. - Right ventricle: Pacer wire or catheter noted in right ventricle. - Right atrium: The atrium was mildly dilated. - Tricuspid valve: Trivial regurgitation. - Pulmonary arteries: PA peak pressure: 15mm Hg (S). -  Pericardium, extracardiac: There was no pericardial effusion.  Assessment and Plan:  1. 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 Weight is down significantly since my last encounter with him. He has had stable thoracic impedance measurements per most recent device check. I reviewed his medications, he continues on diuretics. Also spoke with him about salt intake and diet.  2. 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 He has follow-up with nephrology.  3. History of VT with Medtronic ICD in place, followed by Dr. Rayann Heman. Continues on low-dose amiodarone at 100 mg daily. Follow-up TSH and LFTs.  4. Essential hypertension by history. Blood pressure is low normal today. He is a symptomatic.  Current medicines were reviewed with the patient today.   Orders Placed This Encounter  Procedures  . Basic metabolic panel  . Hepatic function panel    Disposition: Follow-up with me in 6 months.  Signed, Satira Sark, MD, [facility] 08/03/2015 4:37 PM    [place] [facility] [place], [place], [place] [address] Phone:  [phone]; Fax: [phone]

## 2015-08-03 NOTE — Patient Instructions (Addendum)
Medication Instructions:  Your physician recommends that you continue on your current medications as directed. Please refer to the Current Medication list given to you today.  Labwork: Your physician recommends that you return for lab work in: to check your TSH & LFT's. Please have this completed at the [facility].  Testing/Procedures: NONE  Follow-Up: Your physician recommends that you schedule a follow-up appointment in: 6 months. You will receive a reminder letter in the mail in about 4 months reminding you to call and schedule your appointment. If you don't receive this letter, please contact our office.  Any Other Special Instructions Will Be Listed Below (If Applicable).  If you need a refill on your cardiac medications before your next appointment, please call your pharmacy.

## 2015-08-17 ENCOUNTER — Other Ambulatory Visit: Payer: Self-pay | Admitting: Neurology

## 2015-08-17 DIAGNOSIS — M545 Low back pain, unspecified: Secondary | ICD-10-CM

## 2015-08-17 DIAGNOSIS — G8929 Other chronic pain: Secondary | ICD-10-CM

## 2015-08-23 ENCOUNTER — Telehealth: Payer: Self-pay | Admitting: *Deleted

## 2015-08-23 NOTE — Telephone Encounter (Signed)
Patient's nurse informed and copy sent to PCP.

## 2015-08-23 NOTE — Telephone Encounter (Signed)
-----   Message from Satira Sark, MD sent at 08/22/2015  7:34 AM EDT ----- Results reviewed. Creatinine is down to 1.9 from 2.4. Potassium mildly elevated. LFTs normal and TSH normal. Continue same treatment for now. A copy of this test should be forwarded to [facility], MD.

## 2015-08-29 ENCOUNTER — Ambulatory Visit (INDEPENDENT_AMBULATORY_CARE_PROVIDER_SITE_OTHER): Payer: Medicare Other

## 2015-08-29 DIAGNOSIS — Z95810 Presence of automatic (implantable) cardiac defibrillator: Secondary | ICD-10-CM

## 2015-08-29 DIAGNOSIS — I5022 Chronic systolic (congestive) heart failure: Secondary | ICD-10-CM

## 2015-08-29 NOTE — Progress Notes (Signed)
EPIC Encounter for ICM Monitoring  Patient Name: [name] is a 75 y.o. male Date: 08/29/2015 Primary Care Physican: Monico Blitz, MD Primary Cardiologist: Domenic Polite Electrophysiologist: Allred Dry Weight: 308 lb       Heart Failure questions reviewed, pt asymptomatic   Thoracic impedance abnormal suggesting fluid accumulation since 08/06/2015.  Recommendations:   Reviewed transmission with Chanetta Marshall, NP in the office.  Will continue to monitor and advise patient to call if becomes symptomatic.  Repeat transmission on 09/05/2015.  Call back to [facility] and spoke with [name], patient's nurse.  Advise to have patient call if he becomes symptomatic.      Repeat ICM transmission to check fluid levels on 09/05/2015.  ICM trend: 08-29-2015     Follow-up plan: ICM clinic phone appointment on 09/05/2015.  Copy of ICM check sent to primary cardiologist and device physician.   [name], RN 08/29/2015 2:27 PM

## 2015-08-31 ENCOUNTER — Ambulatory Visit
Admission: RE | Admit: 2015-08-31 | Discharge: 2015-08-31 | Disposition: A | Discharge status: Home / Self Care | Payer: Medicare Other | Source: Ambulatory Visit | Attending: Neurology | Admitting: Neurology

## 2015-08-31 DIAGNOSIS — G8929 Other chronic pain: Secondary | ICD-10-CM

## 2015-08-31 DIAGNOSIS — M545 Low back pain: Principal | ICD-10-CM

## 2015-08-31 MED ORDER — METHYLPREDNISOLONE ACETATE 40 MG/ML INJ SUSP (RADIOLOG
120.0000 mg | Freq: Once | INTRAMUSCULAR | Status: AC
  Administered 2015-08-31: 120 mg via EPIDURAL

## 2015-08-31 MED ORDER — IOPAMIDOL (ISOVUE-M 200) INJECTION 41%
1.0000 mL | Freq: Once | INTRAMUSCULAR | Status: AC
  Administered 2015-08-31: 1 mL via EPIDURAL

## 2015-08-31 NOTE — Discharge Instructions (Signed)

## 2015-09-05 ENCOUNTER — Ambulatory Visit (INDEPENDENT_AMBULATORY_CARE_PROVIDER_SITE_OTHER): Payer: Medicare Other

## 2015-09-05 ENCOUNTER — Telehealth: Payer: Self-pay | Admitting: Cardiology

## 2015-09-05 DIAGNOSIS — I5022 Chronic systolic (congestive) heart failure: Secondary | ICD-10-CM

## 2015-09-05 DIAGNOSIS — Z95810 Presence of automatic (implantable) cardiac defibrillator: Secondary | ICD-10-CM

## 2015-09-05 NOTE — Telephone Encounter (Signed)
LMOVM reminding pt to send remote transmission.   

## 2015-09-06 ENCOUNTER — Telehealth: Payer: Self-pay

## 2015-09-06 NOTE — Telephone Encounter (Signed)
Received voice mail message from patient stating he was checking if the transmission was received on 09/05/2015.    Call to patient at [facility] [phone] and advised the transmission to recheck his fluid levels was not received. Requested he send manual transmission.

## 2015-09-07 NOTE — Progress Notes (Signed)
EPIC Encounter for ICM Monitoring  Patient Name: [name] is a 75 y.o. male Date: 09/07/2015 Primary Care Physican: Monico Blitz, MD Primary Cardiologist: Domenic Polite Electrophysiologist: Allred Dry Weight: 308 lbs       Spoke with patient at [facility], [phone].  Heart Failure questions reviewed, pt asymptomatic   Thoracic impedance abnormal suggesting fluid accumulation but starting to trend toward baseline.  08/04/2015 Creatinine 1.91, BUN 54, Potassium 5.2 Sodium 143  04/20/2015 Creatinine 2.45, BUN 63, Potassium 5.5  04/16/2015 Creatinine 1.66, BUN 29, Potassium 5.8 10/30/2014 Creatinine 1.72, BUN 38, Potassium 5.3         Recommendations:  Patient reported he has been eating bacon in the mornings and will cut back on that since it has a lot of salt.  Advised would send to Dr Rayann Heman and Dr Domenic Polite for review and any recommendations.  Next nephrologist appointment is in 3 months.    Follow-up plan: ICM clinic phone appointment on 09/19/2015.  Copy of ICM check sent to primary cardiologist and device physician.   ICM trend: 09/05/2015       [name], RN 09/07/2015 7:49 AM

## 2015-09-19 ENCOUNTER — Ambulatory Visit (INDEPENDENT_AMBULATORY_CARE_PROVIDER_SITE_OTHER): Payer: Medicare Other

## 2015-09-19 ENCOUNTER — Telehealth: Payer: Self-pay

## 2015-09-19 DIAGNOSIS — I5022 Chronic systolic (congestive) heart failure: Secondary | ICD-10-CM

## 2015-09-19 DIAGNOSIS — Z95810 Presence of automatic (implantable) cardiac defibrillator: Secondary | ICD-10-CM

## 2015-09-19 NOTE — Telephone Encounter (Signed)
Remote ICM transmission received.  Attempted patient call to [facility] [phone] and left message with staff for patient to send remote transmission today.

## 2015-09-20 ENCOUNTER — Telehealth: Payer: Self-pay

## 2015-09-20 NOTE — Progress Notes (Signed)
EPIC Encounter for ICM Monitoring  Patient Name: [name] is a 75 y.o. male Date: 09/20/2015 Primary Care Physican: Monico Blitz, MD Primary Cardiologist: Domenic Polite Electrophysiologist: Allred Dry Weight: 308 lb       Heart Failure questions reviewed, pt asymptomatic.  Thoracic impedance returned normal since 09/13/2015.    Recommendations: No changes.  Low sodium diet education provided.  Patient reported he stopped eating bacon as we discussed at last ICM call.     Follow-up plan: ICM clinic phone appointment on 10/25/2015.  Copy of ICM check sent to primary cardiologist and device physician.   ICM trend: 09/20/2015       [name], RN 09/20/2015 1:26 PM

## 2015-09-20 NOTE — Telephone Encounter (Signed)
Return call to patient.  He reported he has tried to send remote transmission at least 3 times and advised it was not received.  Provided tech service number [phone]

## 2015-10-25 ENCOUNTER — Ambulatory Visit (INDEPENDENT_AMBULATORY_CARE_PROVIDER_SITE_OTHER): Payer: Medicare Other | Admitting: *Deleted

## 2015-10-25 DIAGNOSIS — I5022 Chronic systolic (congestive) heart failure: Secondary | ICD-10-CM | POA: Diagnosis not present

## 2015-10-25 DIAGNOSIS — Z95810 Presence of automatic (implantable) cardiac defibrillator: Secondary | ICD-10-CM

## 2015-10-25 DIAGNOSIS — I428 Other cardiomyopathies: Secondary | ICD-10-CM

## 2015-10-25 NOTE — Progress Notes (Signed)
EPIC Encounter for ICM Monitoring  Patient Name: [name] is a 75 y.o. male Date: 10/25/2015 Primary Care Physican: Monico Blitz, MD Primary Cardiologist:McDowell Electrophysiologist: Allred Dry Weight: 308 lb       Heart Failure questions reviewed, pt asymptomatic   Thoracic impedance slightly below baseline suggesting fluid accumulation.  08/04/2015 Creatinine 1.91, BUN 54, Potassium 5.2 Sodium 143  04/20/2015 Creatinine 2.45, BUN 63, Potassium 5.5  04/16/2015 Creatinine 1.66, BUN 29, Potassium 5.8 10/30/2014 Creatinine 1.72, BUN 38, Potassium 5.3  Recommendations: No changes.  Advised to limit salt intake to 2000 mg daily.     Follow-up plan: ICM clinic phone appointment on 11/28/2015.  Copy of ICM check sent to device physician.   ICM trend: 10/25/2015       [name], RN 10/25/2015 4:32 PM

## 2015-10-25 NOTE — Progress Notes (Signed)
Remote ICD transmission.   

## 2015-10-26 ENCOUNTER — Encounter: Payer: Self-pay | Admitting: Cardiology

## 2015-11-04 LAB — CUP PACEART REMOTE DEVICE CHECK
Brady Statistic RV Percent Paced: 0 %
Date Time Interrogation Session: 20171005141709
HIGH POWER IMPEDANCE MEASURED VALUE: 304 Ohm
HIGH POWER IMPEDANCE MEASURED VALUE: 45 Ohm
HighPow Impedance: 57 Ohm
Lead Channel Impedance Value: 418 Ohm
Lead Channel Pacing Threshold Amplitude: 0.625 V
Lead Channel Pacing Threshold Pulse Width: 0.4 ms
Lead Channel Sensing Intrinsic Amplitude: 13.5 mV
Lead Channel Sensing Intrinsic Amplitude: 13.5 mV
Lead Channel Setting Sensing Sensitivity: 0.3 mV
MDC IDC LEAD IMPLANT DT: 20131007
MDC IDC LEAD LOCATION: 753860
MDC IDC LEAD MODEL: 7121
MDC IDC MSMT BATTERY VOLTAGE: 3.12 V
MDC IDC SET LEADCHNL RV PACING AMPLITUDE: 2.5 V
MDC IDC SET LEADCHNL RV PACING PULSEWIDTH: 0.4 ms

## 2015-11-28 ENCOUNTER — Ambulatory Visit (INDEPENDENT_AMBULATORY_CARE_PROVIDER_SITE_OTHER): Payer: Medicare Other

## 2015-11-28 DIAGNOSIS — I5022 Chronic systolic (congestive) heart failure: Secondary | ICD-10-CM | POA: Diagnosis not present

## 2015-11-28 DIAGNOSIS — Z95810 Presence of automatic (implantable) cardiac defibrillator: Secondary | ICD-10-CM

## 2015-11-29 NOTE — Progress Notes (Signed)
Patient's weight was down significantly the last time I saw him. Chart indicates Demadex 40 mg daily. Suggest that he increase Demadex to 60 mg daily for the next 3 days, keep an eye on weight, and then return to prior dose.

## 2015-11-29 NOTE — Progress Notes (Signed)
Call to [facility] and spoke with patient's nurse, [name].   Advised Dr Domenic Polite ordered to increase Demadex to 60 mg daily x next 3 days and then return to prior dose of 40 mg a day.  Advised to monitor weight and she will do daily weights x 1 week.   Recheck fluid levels on 12/05/2015.

## 2015-11-29 NOTE — Progress Notes (Signed)
EPIC Encounter for ICM Monitoring  Patient Name: [name] is a 75 y.o. male Date: 11/29/2015 Primary Care Physican: Monico Blitz, MD Primary Cardiologist:McDowell Electrophysiologist: Allred Dry Weight:    320 lb        Heart Failure questions reviewed, pt reported shortness of breath and weight gain but he was unsure how much weight gain.      Thoracic impedance abnormal suggesting fluid accumulation since 10/24/2015.  Labs:  08/30/2015 Creatinine 1.94 Potassium 4.6 - verbally obtained from [place], [name]. 08/04/2015 Creatinine 1.91, BUN 54, Potassium 5.2 Sodium 143  04/20/2015 Creatinine 2.45, BUN 63, Potassium 5.5  04/16/2015 Creatinine 1.66, BUN 29, Potassium 5.8 10/30/2014 Creatinine 1.72, BUN 38, Potassium 5.3  Recommendations:  Copy of ICM check sent to primary cardiologist and device physician.     Follow-up plan: ICM clinic phone appointment on 12/05/2015 to recheck fluid levels.  Office appointment with Dr Rayann Heman on 12/21/2015.    ICM trend: 11/29/2015       [name], RN 11/29/2015 8:08 AM

## 2015-12-04 ENCOUNTER — Ambulatory Visit (INDEPENDENT_AMBULATORY_CARE_PROVIDER_SITE_OTHER): Payer: Medicare Other

## 2015-12-04 ENCOUNTER — Telehealth: Payer: Self-pay

## 2015-12-04 DIAGNOSIS — I5022 Chronic systolic (congestive) heart failure: Secondary | ICD-10-CM

## 2015-12-04 DIAGNOSIS — Z95810 Presence of automatic (implantable) cardiac defibrillator: Secondary | ICD-10-CM

## 2015-12-04 NOTE — Telephone Encounter (Signed)
Received transmission.  See ICM note.

## 2015-12-04 NOTE — Telephone Encounter (Signed)
Call to [facility] [phone] and spoke with patient's nurse Marlo.  Advised no new remote transmission was received and requested for patient to try again if he is still feeling Ceciley Buist of breath.  She stated she will let him know to try and send again.

## 2015-12-04 NOTE — Progress Notes (Signed)
EPIC Encounter for ICM Monitoring  Patient Name: [name] is a 75 y.o. male Date: 12/04/2015 Primary Care Physican: Monico Blitz, MD Primary Cardiologist: Domenic Polite Electrophysiologist: Allred Dry Weight: 314 lb         Spoke with patient on 11/13 and he complains of increased SOB since last ICM call on 11/8.   Spoke with his nurse today and patient not as SOB as yesterday., no swelling and weight has dropped from 320 lbs last week to 314 lbs today.   Thoracic impedance worsened since last ICM transmission on 11/8.  Impedance is abnormal suggesting fluid accumulation after 3 days of increased Torsemide 11/8.  Confirmed he is taking Torsemide 40 mg daily.  Recommendations:  Pending  Follow-up plan: ICM clinic phone appointment with in the next week pending any recommendations.  Copy of ICM check sent to Dr Domenic Polite and Dr Rayann Heman for recommendations.     ICM trend: 12/04/2015       [name], RN 12/04/2015 10:36 AM

## 2015-12-04 NOTE — Telephone Encounter (Signed)
Received call on 12/03/2015 from patients nurse, [facility] stating patient was feeling increase in shortness of breath.  She stated patient wanted to send a new remote transmission to look at the fluid levels.  Advised to send and will review.

## 2015-12-04 NOTE — Progress Notes (Signed)
Would change his standing Torsemide dose to 40 mg alternating with 60 mg every other day. Check BMET in 2 weeks.

## 2015-12-05 MED ORDER — TORSEMIDE 20 MG PO TABS: ORAL_TABLET | ORAL | 3 refills | Status: DC

## 2015-12-05 NOTE — Progress Notes (Signed)
Call to [facility] at [phone] and spoke with patient's nurse, Rosalee.  Provided Dr McDowell's verbal order Torsemide dose to 40 mg alternating with 60 mg every other day. Check BMET in 2 weeks. She repeated order correctly.  Recheck ICM transmission on 12/10/2015.

## 2015-12-10 ENCOUNTER — Ambulatory Visit (INDEPENDENT_AMBULATORY_CARE_PROVIDER_SITE_OTHER): Payer: Medicare Other

## 2015-12-10 DIAGNOSIS — Z95810 Presence of automatic (implantable) cardiac defibrillator: Secondary | ICD-10-CM

## 2015-12-10 DIAGNOSIS — I5022 Chronic systolic (congestive) heart failure: Secondary | ICD-10-CM

## 2015-12-10 MED ORDER — TORSEMIDE 20 MG PO TABS: ORAL_TABLET | ORAL | 3 refills | Status: DC

## 2015-12-10 NOTE — Progress Notes (Signed)
Error in note, patient is on Demadex not Furosemide.  Call to [facility], nurse at [facility] and advised to increase Demadex to 60 mg twice daily and Dr Domenic Polite is concerned patient may need to be hospitalized for IV diuresis if he clinically worsens.  She verbalized understanding of medication change.  Next ICM transmission 12/17/2015.

## 2015-12-10 NOTE — Progress Notes (Signed)
EPIC Encounter for ICM Monitoring  Patient Name: [name] is a 75 y.o. male Date: 12/10/2015 Primary Care Physican: Monico Blitz, MD Primary Cardiologist: Domenic Polite Electrophysiologist: Allred Dry Weight:    unknown       Spoke with nurse, Leo Grosser, at [facility].  She stated patient was complaining of shortness of breath this morning.  Without oxygen, his O2 sat was 75% and after Oxygen was 95%.  She stated he is moving very little and so far has not gotten out of bed this morning.  She has encouraged him to get up and move around.    Thoracic impedance continues to abnormal despite an increase in Furosemide on 12/04/2015 to 40 mg alternating with 60 mg every other day. Activity level has dropped from 0.5/hr to 0.2 per hour.    Labs:  08/30/2015 Creatinine 1.94 Potassium 4.6 - verbally obtained from [place], [name]. 08/04/2015 Creatinine 1.91, BUN 54, Potassium 5.2 Sodium 143  04/20/2015 Creatinine 2.45, BUN 63, Potassium 5.5  04/16/2015 Creatinine 1.66, BUN 29, Potassium 5.8 10/30/2014 Creatinine 1.72, BUN 38, Potassium 5.3  Recommendations: Advised nurse would call back if any recommendations.    Follow-up plan: ICM clinic phone appointment on 12/17/2015. Office visit with Dr Rayann Heman on 12/21/2015.    Copy of ICM check sent to primary cardiologist and device physician.   ICM trend: 12/10/2015       [name], RN 12/10/2015 12:21 PM

## 2015-12-10 NOTE — Progress Notes (Signed)
He should be on Demadex not Lasix. Change Demadex to 60 mg twice daily. If he continues to worsen clinically, may need to be hospitalized for IV diuresis.

## 2015-12-17 ENCOUNTER — Telehealth: Payer: Self-pay | Admitting: Cardiology

## 2015-12-17 ENCOUNTER — Ambulatory Visit (INDEPENDENT_AMBULATORY_CARE_PROVIDER_SITE_OTHER): Payer: Medicare Other

## 2015-12-17 ENCOUNTER — Telehealth: Payer: Self-pay

## 2015-12-17 DIAGNOSIS — Z95810 Presence of automatic (implantable) cardiac defibrillator: Secondary | ICD-10-CM

## 2015-12-17 DIAGNOSIS — I5022 Chronic systolic (congestive) heart failure: Secondary | ICD-10-CM

## 2015-12-17 NOTE — Telephone Encounter (Signed)
LMOVM reminding pt to send remote transmission.   

## 2015-12-17 NOTE — Telephone Encounter (Signed)
Attempted call to patient at [facility] and patient was unavailable.

## 2015-12-17 NOTE — Progress Notes (Signed)
EPIC Encounter for ICM Monitoring  Patient Name: [name] is a 75 y.o. male Date: 12/17/2015 Primary Care Physican: Monico Blitz, MD Primary Cardiologist:McDowell Electrophysiologist: Allred Weight:  unknown      Attempted ICM call to [facility] and patient was not available.   Transmission reviewed.   Thoracic impedance shows slight improvement but still abnormal, after Demadex was increased to 60 mg twice daily, suggesting fluid accumulation.  Labs:  08/30/2015 Creatinine 1.94 Potassium 4.6 - verbally obtained from [place], [name]. 08/04/2015 Creatinine 1.91, BUN 54, Potassium 5.2 Sodium 143  04/20/2015 Creatinine 2.45, BUN 63, Potassium 5.5  04/16/2015 Creatinine 1.66, BUN 29, Potassium 5.8 10/30/2014 Creatinine 1.72, BUN 38, Potassium 5.3  Recommendations: NONE- unable to reach  Follow-up plan: ICM clinic phone appointment on 01/03/2016.  Office appointment with Dr Rayann Heman on 12/21/2015.  Copy of ICM check sent to primary cardiologist and device physician.   ICM trend: 12/17/2015       [name], RN 12/17/2015 2:09 PM

## 2015-12-18 NOTE — Progress Notes (Signed)
Continue current dose of Demadex. He needs a follow-up BMET.

## 2015-12-18 NOTE — Progress Notes (Signed)
Call to [facility] and spoke with nurse, Martie Lee.  Advised patient needs a BMET drawn and was ordered by Dr Domenic Polite and is scheduled for tomorrow.  Advised to continue current dosage of Demadex.

## 2015-12-18 NOTE — Progress Notes (Signed)
Call to patient at [facility].  Confirmed with nurse that patient is taking the increased dosage of Torsemide which is 60 mg bid as prescribed on 12/10/2015.  Patient denied any fluid symptoms and stated he is feeling fine.  He reported he tries to follow low salt diet but his food choices are still high in sodium.  He eats a lot of ice cream.  Advised to limit salt to 2000 mg a day and he stated since he lives at the rehab center it is difficult to limit salt.   Advised would send copy of report to Dr Domenic Polite and Dr Rayann Heman.  Reminded him of office appointment with Dr Rayann Heman on 12/21/2015.  Advised if fluid levels are high at office visit, Dr Rayann Heman will make recommendations if needed.   Next ICM remote transmission on 01/03/2016

## 2015-12-18 NOTE — Progress Notes (Signed)
Attempted call to patient at [facility] and no answer.

## 2015-12-21 ENCOUNTER — Encounter: Payer: Self-pay | Admitting: Internal Medicine

## 2015-12-21 ENCOUNTER — Ambulatory Visit (INDEPENDENT_AMBULATORY_CARE_PROVIDER_SITE_OTHER): Payer: Medicare Other | Admitting: Internal Medicine

## 2015-12-21 VITALS — BP 103/62 | HR 60 | Ht 72.0 in | Wt 312.4 lb

## 2015-12-21 DIAGNOSIS — I5022 Chronic systolic (congestive) heart failure: Secondary | ICD-10-CM

## 2015-12-21 DIAGNOSIS — I472 Ventricular tachycardia, unspecified: Secondary | ICD-10-CM

## 2015-12-21 DIAGNOSIS — I10 Essential (primary) hypertension: Secondary | ICD-10-CM

## 2015-12-21 MED ORDER — AMIODARONE HCL 200 MG PO TABS: 100.0000 mg | ORAL_TABLET | ORAL | Status: DC

## 2015-12-21 NOTE — Progress Notes (Signed)
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 male who presents today for routine electrophysiology followup.  Since having his last visit, the patient reports doing reasonably well.  He has chronic but stable SOB.  He has started O2.  He remains very overweight but has been able to lose some weight.  He lives at the [facility].  Today, he denies symptoms of palpitations, chest pain, ,  lower extremity edema, dizziness, presyncope, syncope, or ICD shocks.  The patient is otherwise without complaint today.   Past Medical History:  Diagnosis Date  . Arthritis   . CKD (chronic kidney disease), stage II   . Depression   . DVT of upper extremity (deep vein thrombosis) ([place]) 2012   Right arm  . Dyslipidemia   . Essential hypertension, benign   . Gout    Right foot  . Nonischemic dilated cardiomyopathy (HCC)    EF 35-40%, Cath 10/4 showed normal coronaries, EF 40-45%  . Obstructive sleep apnea   . Type 2 diabetes mellitus ([place])   . Ventricular tachycardia ([place])    s/p Medtronic single chamber ICD 10/27/11   Past Surgical History:  Procedure Laterality Date  . APPENDECTOMY  1961  . BUNIONECTOMY     Right foot  . CARDIAC DEFIBRILLATOR PLACEMENT  10/27/11   MDT Sim Boast XT VR ICD implanted by Dr Caryl Comes for VT  . CARPAL TUNNEL RELEASE     Right hand  . CATARACT EXTRACTION W/ INTRAOCULAR LENS  IMPLANT, BILATERAL    . IMPLANTABLE CARDIOVERTER DEFIBRILLATOR IMPLANT N/A 10/27/2011   Procedure: IMPLANTABLE CARDIOVERTER DEFIBRILLATOR IMPLANT;  Surgeon: Deboraha Sprang, MD;  Location: [facility] CATH LAB;  Service: Cardiovascular;  Laterality: N/A;  . JOINT REPLACEMENT    . LEFT HEART CATHETERIZATION WITH CORONARY ANGIOGRAM N/A 12/13/2010   Procedure: LEFT HEART CATHETERIZATION WITH CORONARY ANGIOGRAM;  Surgeon: Hillary Bow, MD;  Location: [facility] CATH LAB;  Service: Cardiovascular;  Laterality: N/A;  . LEFT HEART CATHETERIZATION WITH CORONARY ANGIOGRAM N/A  10/24/2011   Procedure: LEFT HEART CATHETERIZATION WITH CORONARY ANGIOGRAM;  Surgeon: Thayer Headings, MD;  Location: [facility] CATH LAB;  Service: Cardiovascular;  Laterality: N/A;  . RIGHT HEART CATHETERIZATION Right 12/13/2010   Procedure: RIGHT HEART CATH;  Surgeon: Hillary Bow, MD;  Location: [facility] CATH LAB;  Service: Cardiovascular;  Laterality: Right;  . TONSILLECTOMY AND ADENOIDECTOMY  ~ 1950  . TOTAL HIP ARTHROPLASTY     Left  . TOTAL KNEE ARTHROPLASTY     Right    Current Outpatient Prescriptions  Medication Sig Dispense Refill  . acetaminophen (TYLENOL) 500 MG tablet Take 500 mg by mouth every 6 (six) hours as needed.    Marland Kitchen alum & mag hydroxide-simeth (MYLANTA) [id number] MG/5ML suspension Take 20 mLs by mouth every 6 (six) hours as needed for indigestion or heartburn.    Marland Kitchen amiodarone (PACERONE) 200 MG tablet Take 100 mg by mouth daily.    Marland Kitchen amLODipine (NORVASC) 10 MG tablet Take 1 tablet (10 mg total) by mouth daily. 30 tablet 2  . aspirin (ASPIR-81) 81 MG EC tablet Take 81 mg by mouth daily.      . carvedilol (COREG) 25 MG tablet Take 25 mg by mouth 2 (two) times daily with a meal.     . Dextromethorphan-Guaifenesin (ROBITUSSIN DM) 10-100 MG/5ML liquid Take 10 mLs by mouth every 4 (four) hours as needed (cough).    . diphenhydrAMINE (BENADRYL) 25 MG tablet Take 50 mg  by mouth every 4 (four) hours as needed for itching.    . DULoxetine (CYMBALTA) 20 MG capsule Take 20 mg by mouth daily.    . fluticasone (FLONASE) 50 MCG/ACT nasal spray Place 2 sprays into the nose daily as needed. For dry nose    . HYDROcodone-acetaminophen (NORCO/VICODIN) 5-325 MG per tablet Take 1 tablet by mouth 2 (two) times daily.    . indomethacin (INDOCIN) 50 MG capsule Take 50 mg by mouth 3 (three) times daily as needed.    . iron polysaccharides (NIFEREX) 150 MG capsule Take 150 mg by mouth daily.    Marland Kitchen lisinopril (PRINIVIL,ZESTRIL) 20 MG tablet Take 40 mg by mouth daily.    Marland Kitchen loperamide (IMODIUM A-D) 2 MG  tablet Take 2 mg by mouth 4 (four) times daily as needed for diarrhea or loose stools.    . simethicone (MYLICON) 80 MG chewable tablet Chew 80 mg by mouth every 6 (six) hours as needed for flatulence.    . tamsulosin (FLOMAX) 0.4 MG CAPS capsule Take 0.4 mg by mouth daily.    Marland Kitchen torsemide (DEMADEX) 20 MG tablet Take 3 tablets (60 mg total) by mouth twice daily 540 tablet 3   No current facility-administered medications for this visit.     Physical Exam: Vitals:   12/21/15 0904  BP: 103/62  Pulse: 60  SpO2: 98%  Weight: (!) 312 lb 6.4 oz (141.7 kg)  Height: 6' (1.829 m)    GEN- The patient is obese appearing, alert and oriented x 3 today.   Head- normocephalic, atraumatic Eyes-  Sclera clear, conjunctiva pink Ears- hearing intact Oropharynx- clear Lungs- decreased BS at bases, normal work of breathing Chest- ICD pocket is well healed Heart- Regular rate and rhythm, no murmurs, rubs or gallops, PMI not laterally displaced GI- soft, NT, ND, + BS Extremities- no clubbing, cyanosis, or edema Wearing O2, in a wheelchair today + JVD  ICD interrogation- reviewed in detail today,  See PACEART report ekg today reveals sinus rhythm PR 234 msec, QRS 134 msec (IVCD), PVCs  Assessment and Plan:  1.  VT Normal ICD function See Claudia Desanctis Art report VT is well controlled with low dose amiodarone (no VT in 3 years) Though he is unwillling to stop amiodarone due to concerns of ICD shock, he is willing to reduce amiodarone to 100mg  QOD.  Risks of long term amiodarone were discussed with patient.  LFTs from 7/17 reviewed. Will need TFTs, LFTs upon follow-up with Dr Domenic Polite.  2. Nonischemic cardiomyopathy/ chronic systolic dysfunction Volume overloaded today Volume management is complicated by CRI.   Increase demadex to 80mg  BID x 5 days then return to 60mg  BID (current dose) Followed  in the [facility] ICM clinic No changes  3. HTN Stable No change required today    [facility] Return to see  me in 1 year Follow-up with Dr Domenic Polite as scheduled  Thompson Grayer MD, [facility] 12/21/2015 9:33 AM

## 2015-12-21 NOTE — Patient Instructions (Addendum)
Medication Instructions:   Decrease Amiodarone to 100mg  every other day.  Increase Torsemide to 80mg  twice a day  X 5 days, then back to previous dose of 60mg  twice a day.  Continue all other medications.    Labwork: none  Testing/Procedures: none  Follow-Up: Your physician wants you to follow up in:  1 year.  You will receive a reminder letter in the mail one-two months in advance.  If you don't receive a letter, please call our office to schedule the follow up appointment.  Any Other Special Instructions Will Be Listed Below (If Applicable).  Remote monitoring is used to monitor your Pacemaker of ICD from home. This monitoring reduces the number of office visits required to check your device to one time per year. It allows [place] to keep an eye on the functioning of your device to ensure it is working properly. You are scheduled for a device check from home on 03/24/2016. You may send your transmission at any time that day. If you have a wireless device, the transmission will be sent automatically. After your physician reviews your transmission, you will receive a postcard with your next transmission date.   2 gm sodium diet   If you need a refill on your cardiac medications before your next appointment, please call your pharmacy.

## 2015-12-25 LAB — CUP PACEART INCLINIC DEVICE CHECK
HIGH POWER IMPEDANCE MEASURED VALUE: 304 Ohm
HIGH POWER IMPEDANCE MEASURED VALUE: 46 Ohm
HIGH POWER IMPEDANCE MEASURED VALUE: 59 Ohm
Implantable Lead Implant Date: 20131007
Lead Channel Pacing Threshold Amplitude: 0.625 V
Lead Channel Pacing Threshold Pulse Width: 0.4 ms
Lead Channel Sensing Intrinsic Amplitude: 10 mV
Lead Channel Sensing Intrinsic Amplitude: 10.25 mV
Lead Channel Setting Pacing Pulse Width: 0.4 ms
MDC IDC LEAD LOCATION: 753860
MDC IDC LEAD MODEL: 7121
MDC IDC MSMT BATTERY VOLTAGE: 3.1 V
MDC IDC MSMT LEADCHNL RV IMPEDANCE VALUE: 418 Ohm
MDC IDC PG IMPLANT DT: 20131007
MDC IDC SESS DTM: 20171201152456
MDC IDC SET LEADCHNL RV PACING AMPLITUDE: 2.5 V
MDC IDC SET LEADCHNL RV SENSING SENSITIVITY: 0.3 mV
MDC IDC STAT BRADY RV PERCENT PACED: 0.02 %

## 2016-01-03 ENCOUNTER — Telehealth: Payer: Self-pay | Admitting: Cardiology

## 2016-01-03 NOTE — Telephone Encounter (Signed)
Confirmed remote transmission w/ pt caregiver.   

## 2016-01-04 NOTE — Progress Notes (Signed)
No ICM remote transmission received on 01/03/2016 due to patients monitor is not working.  He is waiting on new monitor.   Next ICM transmission scheduled for 01/29/2016.

## 2016-01-29 ENCOUNTER — Ambulatory Visit (INDEPENDENT_AMBULATORY_CARE_PROVIDER_SITE_OTHER): Payer: Medicare Other

## 2016-01-29 ENCOUNTER — Telehealth: Payer: Self-pay

## 2016-01-29 DIAGNOSIS — I5022 Chronic systolic (congestive) heart failure: Secondary | ICD-10-CM | POA: Diagnosis not present

## 2016-01-29 DIAGNOSIS — Z95810 Presence of automatic (implantable) cardiac defibrillator: Secondary | ICD-10-CM | POA: Diagnosis not present

## 2016-01-29 NOTE — Telephone Encounter (Signed)
Remote ICM transmission received.  Attempted patient call and no answer 

## 2016-01-29 NOTE — Progress Notes (Signed)
EPIC Encounter for ICM Monitoring  Patient Name: [name] is a 76 y.o. male Date: 01/29/2016 Primary Care Physican: Monico Blitz, MD Primary Cardiologist: Domenic Polite Electrophysiologist: Allred Dry Weight:  unknown        Attempted patient call to [facility] at [phone] and no answer.  Thoracic impedance close to baseline.  Recommendations:  Unable to reach   Follow-up plan: ICM clinic phone appointment on 02/28/2016.  Copy of ICM check sent to primary cardiologist and device physician.   3 month ICM trend : 01/29/2016   1 Year ICM trend:      [name], RN 01/29/2016 10:10 AM

## 2016-02-06 ENCOUNTER — Ambulatory Visit: Payer: Self-pay | Admitting: "Endocrinology

## 2016-02-20 NOTE — Progress Notes (Signed)
Cardiology Office Note  Date: 02/21/2016   ID: [name], DOB 12/19/1940, MRN [id number]  PCP: Monico Blitz, MD  Primary Cardiologist: Rozann Lesches, MD   Chief Complaint  Patient presents with  . Cardiomyopathy    History of Present Illness: [name] is a 76 y.o. male last seen in July 2017. He presents for a routine follow-up visit. He continues to reside at the [facility]. He does not report any change in dyspnea on exertion. In reviewing his medicines I see that his Demadex dose was cut back to 20 mg once daily by Dr. Lowanda Foster, presumably due to worsening renal insufficiency. His weight is actually down about 20 pounds from when I saw him last time. He has no leg edema at present.  He continues to follow in the device clinic with Dr. Rayann Heman, Medtronic ICD in place. In general his thoracic impedance measurements have been consistent with volume overload.  Blood pressure is low today. We reviewed his medications and discussed reducing Norvasc dose. Dr. Rayann Heman also cut back his amiodarone dose further at the last encounter. He continues to follow with Dr. Manuella Ghazi for routine lab work.  Past Medical History:  Diagnosis Date  . Arthritis   . CKD (chronic kidney disease), stage II   . Depression   . DVT of upper extremity (deep vein thrombosis) ([place]) 2012   Right arm  . Dyslipidemia   . Essential hypertension, benign   . Gout    Right foot  . Nonischemic dilated cardiomyopathy (HCC)    EF 35-40%, Cath 10/4 showed normal coronaries, EF 40-45%  . Obstructive sleep apnea   . Type 2 diabetes mellitus ([place])   . Ventricular tachycardia ([place])    s/p Medtronic single chamber ICD 10/27/11    Past Surgical History:  Procedure Laterality Date  . APPENDECTOMY  1961  . BUNIONECTOMY     Right foot  . CARDIAC DEFIBRILLATOR PLACEMENT  10/27/11   MDT Sim Boast XT VR ICD implanted by Dr Caryl Comes for VT  . CARPAL TUNNEL RELEASE     Right hand  . CATARACT EXTRACTION W/  INTRAOCULAR LENS  IMPLANT, BILATERAL    . IMPLANTABLE CARDIOVERTER DEFIBRILLATOR IMPLANT N/A 10/27/2011   Procedure: IMPLANTABLE CARDIOVERTER DEFIBRILLATOR IMPLANT;  Surgeon: Deboraha Sprang, MD;  Location: [facility] CATH LAB;  Service: Cardiovascular;  Laterality: N/A;  . JOINT REPLACEMENT    . LEFT HEART CATHETERIZATION WITH CORONARY ANGIOGRAM N/A 12/13/2010   Procedure: LEFT HEART CATHETERIZATION WITH CORONARY ANGIOGRAM;  Surgeon: Hillary Bow, MD;  Location[facility] C[facility] LAB;  Service: Cardiovascular;  Laterality: N/A;  . LEFT HEART CATHETERIZATION WITH CORONARY ANGIOGRAM N/A 10/24/2011   Procedure: LEFT HEART CATHETERIZATION WITH CORONARY ANGIOGRAM;  Surgeon: Thayer Headings, MD;  Location: [facility] CATH LAB;  Service: Cardiovascular;  Laterality: N/A;  . RIGHT HEART CATHETERIZATION Right 12/13/2010   Procedure: RIGHT HEART CATH;  Surgeon: Hillary Bow, MD;  Location: [facility] CATH LAB;  Service: Cardiovascular;  Laterality: Right;  . TONSILLECTOMY AND ADENOIDECTOMY  ~ 1950  . TOTAL HIP ARTHROPLASTY     Left  . TOTAL KNEE ARTHROPLASTY     Right    Current Outpatient Prescriptions  Medication Sig Dispense Refill  . acetaminophen (TYLENOL) 500 MG tablet Take 500 mg by mouth every 6 (six) hours as needed.    Marland Kitchen alum & mag hydroxide-simeth (MYLANTA) [id number] MG/5ML suspension Take 20 mLs by mouth every 6 (six) hours as needed for indigestion or heartburn.    Marland Kitchen amiodarone (  PACERONE) 200 MG tablet Take 0.5 tablets (100 mg total) by mouth every other day.    Marland Kitchen amLODipine (NORVASC) 10 MG tablet Take 1 tablet (10 mg total) by mouth daily. 30 tablet 2  . aspirin (ASPIR-81) 81 MG EC tablet Take 81 mg by mouth daily.      . carvedilol (COREG) 25 MG tablet Take 25 mg by mouth 2 (two) times daily with a meal.     . Dextromethorphan-Guaifenesin (ROBITUSSIN DM) 10-100 MG/5ML liquid Take 10 mLs by mouth every 4 (four) hours as needed (cough).    . diphenhydrAMINE (BENADRYL) 25 MG tablet Take 50 mg by mouth every 4  (four) hours as needed for itching.    . DULoxetine (CYMBALTA) 20 MG capsule Take 20 mg by mouth daily.    . finasteride (PROSCAR) 5 MG tablet Take 5 mg by mouth daily.    . fluticasone (FLONASE) 50 MCG/ACT nasal spray Place 2 sprays into the nose daily as needed. For dry nose    . HYDROcodone-acetaminophen (NORCO/VICODIN) 5-325 MG per tablet Take 1 tablet by mouth 2 (two) times daily.    . indomethacin (INDOCIN) 50 MG capsule Take 50 mg by mouth 3 (three) times daily as needed.    . iron polysaccharides (NIFEREX) 150 MG capsule Take 150 mg by mouth daily.    Marland Kitchen lisinopril (PRINIVIL,ZESTRIL) 20 MG tablet Take 40 mg by mouth daily.    Marland Kitchen loperamide (IMODIUM A-D) 2 MG tablet Take 2 mg by mouth 4 (four) times daily as needed for diarrhea or loose stools.    . simethicone (MYLICON) 80 MG chewable tablet Chew 80 mg by mouth every 6 (six) hours as needed for flatulence.    . tamsulosin (FLOMAX) 0.4 MG CAPS capsule Take 0.4 mg by mouth daily.    Marland Kitchen torsemide (DEMADEX) 20 MG tablet Take 3 tablets (60 mg total) by mouth twice daily 540 tablet 3   No current facility-administered medications for this visit.    Allergies:  Codeine; Milk-related compounds; and Warfarin sodium   Social History: The patient  reports that he quit smoking about 34 years ago. His smoking use included Cigarettes. He has a 15.00 pack-year smoking history. He has never used smokeless tobacco. He reports that he does not drink alcohol or use drugs.   ROS:  Please see the history of present illness. Otherwise, complete review of systems is positive for cold-like symptoms.  All other systems are reviewed and negative.   Physical Exam: VS:  BP (!) 88/52   Pulse (!) 59   Ht 6' (1.829 m)   Wt 289 lb (131.1 kg)   SpO2 98%   BMI 39.20 kg/m , BMI Body mass index is 39.2 kg/m.  Wt Readings from Last 3 Encounters:  02/21/16 289 lb (131.1 kg)  12/21/15 (!) 312 lb 6.4 oz (141.7 kg)  08/03/15 (!) 309 lb (140.2 kg)    Morbidly obese  male in no acute distress. Seated in wheelchair. HEENT: Conjunctiva and lids normal, oropharynx clear.  Neck: Supple, no obvious elevated JVP with increased girth, no carotid bruits, no thyromegaly.  Lungs: Clear to auscultation, diminished throughout, nonlabored breathing at rest.  Thorax: Stable device pocket site.  Cardiac: Regular rate and rhythm, no S3, distant heart sounds, no pericardial rub.  Abdomen: Soft, nontender, bowel sounds present.  Extremities: No pitting edema, distal pulses 2+.  Skin: Warm and dry. Musculoskeletal: No kyphosis. Neuropsychiatric: Alert and oriented 3, affect appropriate.  ECG: I personally reviewed the tracing from  12/21/2015 which showed sinus bradycardia with prolonged PR interval and PVCs, IVCD, old anterior infarct pattern.  Recent Labwork:  November 2017: Potassium 4.8, BUN 71, creatinine 2.6  Other Studies Reviewed Today:  Echocardiogram 03/17/2012: Study Conclusions  - Left ventricle: The cavity size was moderately dilated. Wall thickness was increased in a pattern of mild LVH. Systolic function was mildly to moderately reduced. The estimated ejection fraction was in the range of 40% to 45%. Diffuse hypokinesis - most prominent, nearly akineticin basal inferoposterior wall. Doppler parameters are consistent with abnormal left ventricular relaxation (grade 1 diastolic dysfunction). Unable to compare with previous study. - Mitral valve: Mild regurgitation. - Left atrium: The atrium was mildly to moderately dilated. - Right ventricle: Pacer wire or catheter noted in right ventricle. - Right atrium: The atrium was mildly dilated. - Tricuspid valve: Trivial regurgitation. - Pulmonary arteries: PA peak pressure: 21mm Hg (S). - Pericardium, extracardiac: There was no pericardial effusion.  Assessment and Plan:  1. Nonischemic cardiomyopathy with LVEF 40-45% by last assessment. Weight is down 20 pounds from the  last encounter and his Demadex was cut back by Dr. Lowanda Foster in early January. Recommend keeping a close eye on weight for subsequent fluid gain as he will likely need further adjustments in his diuretics. This is been limited by progressive renal insufficiency.  2. History of essential hypertension, blood pressure low at this time. Reduce Norvasc to 5 mg daily.  3. CKD stage 3-4, followed by Dr. Lowanda Foster.  4. Chronic ICD in place, followed by Dr. Rayann Heman. Patient remains on very low-dose amiodarone with prior history of VT.  Current medicines were reviewed with the patient today.  Disposition: Follow-up in 3 months.  Signed, Satira Sark, MD, [facility] 02/21/2016 9:35 AM    [facility] [place], [place], [place] [address] Phone: [phone]; Fax: [phone]

## 2016-02-21 ENCOUNTER — Ambulatory Visit (INDEPENDENT_AMBULATORY_CARE_PROVIDER_SITE_OTHER): Payer: Medicare Other | Admitting: Cardiology

## 2016-02-21 ENCOUNTER — Encounter: Payer: Self-pay | Admitting: Cardiology

## 2016-02-21 VITALS — BP 88/52 | HR 59 | Ht 72.0 in | Wt 289.0 lb

## 2016-02-21 DIAGNOSIS — I5022 Chronic systolic (congestive) heart failure: Secondary | ICD-10-CM | POA: Diagnosis not present

## 2016-02-21 DIAGNOSIS — Z95810 Presence of automatic (implantable) cardiac defibrillator: Secondary | ICD-10-CM

## 2016-02-21 DIAGNOSIS — N184 Chronic kidney disease, stage 4 (severe): Secondary | ICD-10-CM

## 2016-02-21 DIAGNOSIS — I428 Other cardiomyopathies: Secondary | ICD-10-CM | POA: Diagnosis not present

## 2016-02-21 MED ORDER — TORSEMIDE 20 MG PO TABS: 20.0000 mg | ORAL_TABLET | Freq: Every day | ORAL | Status: DC

## 2016-02-21 MED ORDER — AMLODIPINE BESYLATE 5 MG PO TABS: 5.0000 mg | ORAL_TABLET | Freq: Every day | ORAL | Status: DC

## 2016-02-21 NOTE — Patient Instructions (Signed)
Your physician recommends that you schedule a follow-up appointment in: 3 months with Dr. Domenic Polite.   Your physician has recommended you make the following change in your medication:  Decrease Norvasc to 5mg  daily Continue Demadex at 20mg  daily   Thank you for choosing [facility]!!

## 2016-02-29 ENCOUNTER — Telehealth: Payer: Self-pay

## 2016-02-29 ENCOUNTER — Ambulatory Visit (INDEPENDENT_AMBULATORY_CARE_PROVIDER_SITE_OTHER): Payer: Medicare Other

## 2016-02-29 DIAGNOSIS — I5022 Chronic systolic (congestive) heart failure: Secondary | ICD-10-CM | POA: Diagnosis not present

## 2016-02-29 DIAGNOSIS — Z95810 Presence of automatic (implantable) cardiac defibrillator: Secondary | ICD-10-CM

## 2016-02-29 NOTE — Telephone Encounter (Signed)
Remote ICM transmission received.  Attempted patient call at [facility] and no answer.

## 2016-02-29 NOTE — Progress Notes (Signed)
EPIC Encounter for ICM Monitoring  Patient Name: [name] is a 76 y.o. male Date: 02/29/2016 Primary Care Physican: Monico Blitz, MD Primary Cardiologist:[place] Electrophysiologist: Allred Dry Weight:unknown               Attempted call to patient at [facility] and unable to reach. Transmission reviewed.   Thoracic impedance normal   Recommendations: NONE - Unable to reach patient   Follow-up plan: ICM clinic phone appointment on 03/31/2016.  Copy of ICM check sent to device physician.   3 month ICM trend: 02/29/2016   1 Year ICM trend:      [name], RN 02/29/2016 8:25 AM

## 2016-03-06 ENCOUNTER — Ambulatory Visit (INDEPENDENT_AMBULATORY_CARE_PROVIDER_SITE_OTHER): Payer: Medicare Other | Admitting: "Endocrinology

## 2016-03-06 ENCOUNTER — Encounter: Payer: Self-pay | Admitting: "Endocrinology

## 2016-03-06 VITALS — BP 132/84 | HR 60 | Temp 98.8°F | Ht 72.0 in

## 2016-03-06 DIAGNOSIS — N182 Chronic kidney disease, stage 2 (mild): Secondary | ICD-10-CM | POA: Diagnosis not present

## 2016-03-06 DIAGNOSIS — E212 Other hyperparathyroidism: Secondary | ICD-10-CM | POA: Diagnosis not present

## 2016-03-06 MED ORDER — CINACALCET HCL 30 MG PO TABS: 30.0000 mg | ORAL_TABLET | Freq: Every day | ORAL | 3 refills | Status: AC

## 2016-03-06 MED ORDER — CALCIUM ACETATE 667 MG PO CAPS: 667.0000 mg | ORAL_CAPSULE | Freq: Three times a day (TID) | ORAL | 2 refills | Status: DC

## 2016-03-06 NOTE — Progress Notes (Signed)
Subjective:    Patient ID: [name], male    DOB: 1940-08-17, PCP Monico Blitz, MD   Past Medical History:  Diagnosis Date  . Arthritis   . CKD (chronic kidney disease), stage II   . Depression   . DVT of upper extremity (deep vein thrombosis) ([place]) 2012   Right arm  . Dyslipidemia   . Essential hypertension, benign   . Gout    Right foot  . Nonischemic dilated cardiomyopathy (HCC)    EF 35-40%, Cath 10/4 showed normal coronaries, EF 40-45%  . Obstructive sleep apnea   . Type 2 diabetes mellitus ([place])   . Ventricular tachycardia ([place])    s/p Medtronic single chamber ICD 10/27/11   Past Surgical History:  Procedure Laterality Date  . APPENDECTOMY  1961  . BUNIONECTOMY     Right foot  . CARDIAC DEFIBRILLATOR PLACEMENT  10/27/11   MDT Sim Boast XT VR ICD implanted by Dr Caryl Comes for VT  . CARPAL TUNNEL RELEASE     Right hand  . CATARACT EXTRACTION W/ INTRAOCULAR LENS  IMPLANT, BILATERAL    . IMPLANTABLE CARDIOVERTER DEFIBRILLATOR IMPLANT N/A 10/27/2011   Procedure: IMPLANTABLE CARDIOVERTER DEFIBRILLATOR IMPLANT;  Surgeon: Deboraha Sprang, MD;  Location: [facility] CATH LAB;  Service: Cardiovascular;  Laterality: N/A;  . JOINT REPLACEMENT    . LEFT HEART CATHETERIZATION WITH CORONARY ANGIOGRAM N/A 12/13/2010   Procedure: LEFT HEART CATHETERIZATION WITH CORONARY ANGIOGRAM;  Surgeon: Hillary Bow, MD;  Location: [facility] CATH LAB;  Service: Cardiovascular;  Laterality: N/A;  . LEFT HEART CATHETERIZATION WITH CORONARY ANGIOGRAM N/A 10/24/2011   Procedure: LEFT HEART CATHETERIZATION WITH CORONARY ANGIOGRAM;  Surgeon: Thayer Headings, MD;  Location: [facility] CATH LAB;  Service: Cardiovascular;  Laterality: N/A;  . RIGHT HEART CATHETERIZATION Right 12/13/2010   Procedure: RIGHT HEART CATH;  Surgeon: Hillary Bow, MD;  Location: [facility] CATH LAB;  Service: Cardiovascular;  Laterality: Right;  . TONSILLECTOMY AND ADENOIDECTOMY  ~ 1950  . TOTAL HIP ARTHROPLASTY     Left  . TOTAL KNEE ARTHROPLASTY      Right   Social History   Social History  . Marital status: Single    Spouse name: N/A  . Number of children: N/A  . Years of education: N/A   Social History Main Topics  . Smoking status: Former Smoker    Packs/day: 1.00    Years: 15.00    Types: Cigarettes    Quit date: 01/20/1982  . Smokeless tobacco: Never Used     Comment: stopped smoking cigarettes 1984  . Alcohol use No     Comment: "stopped drinking alcohol 1984"  . Drug use: No  . Sexual activity: No   Other Topics Concern  . None   Social History Narrative  . None   Outpatient Encounter Prescriptions as of 03/06/2016  Medication Sig  . acetaminophen (TYLENOL) 500 MG tablet Take 500 mg by mouth every 6 (six) hours as needed.  Marland Kitchen alum & mag hydroxide-simeth (MYLANTA) [id number] MG/5ML suspension Take 20 mLs by mouth every 6 (six) hours as needed for indigestion or heartburn.  Marland Kitchen amiodarone (PACERONE) 200 MG tablet Take 0.5 tablets (100 mg total) by mouth every other day.  Marland Kitchen amLODipine (NORVASC) 5 MG tablet Take 1 tablet (5 mg total) by mouth daily.  Marland Kitchen aspirin (ASPIR-81) 81 MG EC tablet Take 81 mg by mouth daily.    . calcium acetate (PHOSLO) 667 MG capsule Take 1 capsule (667 mg total) by mouth 3 (three)  times daily with meals.  . carvedilol (COREG) 25 MG tablet Take 25 mg by mouth 2 (two) times daily with a meal.   . cinacalcet (SENSIPAR) 30 MG tablet Take 1 tablet (30 mg total) by mouth daily with breakfast.  . Dextromethorphan-Guaifenesin (ROBITUSSIN DM) 10-100 MG/5ML liquid Take 10 mLs by mouth every 4 (four) hours as needed (cough).  . diphenhydrAMINE (BENADRYL) 25 MG tablet Take 50 mg by mouth every 4 (four) hours as needed for itching.  . DULoxetine (CYMBALTA) 20 MG capsule Take 20 mg by mouth daily.  . finasteride (PROSCAR) 5 MG tablet Take 5 mg by mouth daily.  . fluticasone (FLONASE) 50 MCG/ACT nasal spray Place 2 sprays into the nose daily as needed. For dry nose  . HYDROcodone-acetaminophen  (NORCO/VICODIN) 5-325 MG per tablet Take 1 tablet by mouth 2 (two) times daily.  . indomethacin (INDOCIN) 50 MG capsule Take 50 mg by mouth 3 (three) times daily as needed.  . iron polysaccharides (NIFEREX) 150 MG capsule Take 150 mg by mouth daily.  Marland Kitchen lisinopril (PRINIVIL,ZESTRIL) 20 MG tablet Take 40 mg by mouth daily.  Marland Kitchen loperamide (IMODIUM A-D) 2 MG tablet Take 2 mg by mouth 4 (four) times daily as needed for diarrhea or loose stools.  . simethicone (MYLICON) 80 MG chewable tablet Chew 80 mg by mouth every 6 (six) hours as needed for flatulence.  . tamsulosin (FLOMAX) 0.4 MG CAPS capsule Take 0.4 mg by mouth daily.  Marland Kitchen torsemide (DEMADEX) 20 MG tablet Take 1 tablet (20 mg total) by mouth daily.   No facility-administered encounter medications on file as of 03/06/2016.    ALLERGIES: Allergies  Allergen Reactions  . Codeine Other (See Comments)    Bones ache  . Milk-Related Compounds   . Warfarin Sodium     REACTION: possible Coumadin induced necrosis   VACCINATION STATUS: Immunization History  Administered Date(s) Administered  . Influenza Split 10/24/2011    HPI 76 year old gentleman with  multiple medical problems as above. He is a nursing home resident, wheelchair-bound, he is being seen in consultation for elevated PTH requested by Dr. Manuella Ghazi. He is known to have chronic kidney disease stage 3-4, following with nephrology. He was observed to have low normal calcium associated with his renal failure. On 12/05/2015 he was found to have PTH of 108 (normal 14-64). He denies any prior knowledge of parathyroid dysfunction. He denies history of nephrolithiasis, seizure disorder. -He is not on any calcium supplement nor any phosphate binding therapy. -He is wheelchair-bound due to multiple reasons including disequilibrium, lumbago, and unspecified abnormalities of gait. - He denies any recent neck swelling. - As well controlled type 2 diabetes with recent A1c was 6.9%.   Review of  Systems Constitutional: Denies recent weight change, no fatigue, no subjective hyperthermia/hypothermia Eyes: no blurry vision, no xerophthalmia ENT: no sore throat, no nodules palpated in throat, no dysphagia/odynophagia, no hoarseness Cardiovascular: no CP/SOB/palpitations/leg swelling Respiratory: no cough/SOB Gastrointestinal: no N/V/D/C Musculoskeletal: Wheelchair-bound for several years ,  no muscle/joint aches Skin: no rashes Neurological: no tremors/numbness/tingling/dizziness Psychiatric: no depression/anxiety  Objective:    BP 132/84   Pulse 60   Temp 98.8 F (37.1 C)   Ht 6' (1.829 m)   SpO2 99%   Wt Readings from Last 3 Encounters:  02/21/16 289 lb (131.1 kg)  12/21/15 (!) 312 lb 6.4 oz (141.7 kg)  08/03/15 (!) 309 lb (140.2 kg)    Physical Exam   Constitutional: wheelchair bound,  overweight, in NAD Eyes: PERRLA, EOMI,  no exophthalmos ENT: moist mucous membranes, no thyromegaly, no cervical lymphadenopathy Cardiovascular: RRR, No MRG Respiratory: CTA B Gastrointestinal: abdomen soft, NT, ND, BS+ Musculoskeletal: strength intact in all 4 Skin: moist, warm, no rashes Neurological: no tremor with outstretched hands, DTR normal in all 4   CMP     Component Value Date/Time   NA 138 10/28/2011 0540   K 4.6 10/28/2011 0540   CL 105 10/28/2011 0540   CO2 24 10/28/2011 0540   GLUCOSE 121 (H) 10/28/2011 0540   BUN 25 (H) 10/28/2011 0540   CREATININE 1.37 (H) 10/28/2011 0540   CALCIUM 8.9 10/28/2011 0540   GFRNONAA 51 (L) 10/28/2011 0540   GFRAA 59 (L) 10/28/2011 0540   01/16/2016 labs showed creatinine 2.99, calcium 8.5, albumin 3.5, phosphorus 4.5  Diabetic Labs (most recent): Lab Results  Component Value Date   HGBA1C 6.9 (H) 12/11/2010     Lipid Panel ( most recent) Lipid Panel     Component Value Date/Time   CHOL 141 12/12/2010 0630   TRIG 114 12/12/2010 0630   HDL 31 (L) 12/12/2010 0630   CHOLHDL 4.5 12/12/2010 0630   VLDL 23 12/12/2010 0630    LDLCALC 87 12/12/2010 0630    Outside labs from 12/05/2015 showed: PTH 108 calcium 8.6  Assessment & Plan:   1. Secondary hyperparathyroidism ([place]) - I have reviewed his available records which suggest secondary hyperparathyroidism from his CKD stage 3-4. - He is not a surgical candidate for hyperparathyroidism. - I emphasized to his nursing home the need for continued follow-up with nephrology. - In the meantime, he would benefit from phosphate binding calcium supplements and calcinomimetic medication. - I will initiate PhosLo 667 mg by mouth 3 times a day with meals and Sensipar 30 mg by mouth daily.  - I advised for him to continue calcitriol 0.25 g 3 days a week. - He'll return in 3 months with repeat labs including: - PTH, intact and calcium;- Phosphorus;- Magnesium - VITAMIN D 25 Hydroxy  2. CKD (chronic kidney disease), stage 3-4 - Continue follow-up with nephrology. 3. Type 2 diabetes: Controlled with A1c 6.9%.    - I advised patient to maintain close follow up with [facility], MD for primary care needs. Follow up plan: Return in about 3 months (around 06/03/2016) for follow up with pre-visit labs.  Glade Lloyd, MD Phone: [phone]  Fax: [phone]   03/06/2016, 10:53 AM

## 2016-03-31 ENCOUNTER — Telehealth: Payer: Self-pay

## 2016-03-31 ENCOUNTER — Ambulatory Visit (INDEPENDENT_AMBULATORY_CARE_PROVIDER_SITE_OTHER): Payer: Medicare Other | Admitting: *Deleted

## 2016-03-31 DIAGNOSIS — Z95810 Presence of automatic (implantable) cardiac defibrillator: Secondary | ICD-10-CM | POA: Diagnosis not present

## 2016-03-31 DIAGNOSIS — I5022 Chronic systolic (congestive) heart failure: Secondary | ICD-10-CM

## 2016-03-31 DIAGNOSIS — I428 Other cardiomyopathies: Secondary | ICD-10-CM

## 2016-03-31 NOTE — Telephone Encounter (Signed)
Remote ICM transmission received.  Attempted patient call at [facility] and patient did not come to the phone

## 2016-03-31 NOTE — Progress Notes (Signed)
EPIC Encounter for ICM Monitoring  Patient Name: [name] is a 76 y.o. male Date: 03/31/2016 Primary Care Physican: Monico Blitz, MD ary Cardiologist:McDowell Electrophysiologist: Allred Dry Weight:unknown              Attempted call to patient at [facility] at [phone] and unable to reach.  Transmission reviewed.     Thoracic impedance abnormal suggesting fluid accumulation since 03/24/2016 and trending back toward baseline today.  Prescribed dosage: Torsemide 20 mg 1 tablet daily  Recommendations: NONE - Unable to reach patient   Follow-up plan: ICM clinic phone appointment on 04/15/2016 to recheck fluid levels.  Copy of ICM check sent to primary cardiologist and device physician.   3 month ICM trend: 03/31/2016   1 Year ICM trend:      [name], RN 03/31/2016 10:43 AM

## 2016-04-01 NOTE — Progress Notes (Signed)
Remote ICD transmission.   

## 2016-04-02 ENCOUNTER — Encounter: Payer: Self-pay | Admitting: Cardiology

## 2016-04-04 LAB — CUP PACEART REMOTE DEVICE CHECK
Battery Voltage: 3.1 V
Date Time Interrogation Session: 20180316092544
Implantable Lead Implant Date: 20131007
MDC IDC LEAD LOCATION: 753860
MDC IDC MSMT LEADCHNL RV IMPEDANCE VALUE: 418 Ohm
MDC IDC MSMT LEADCHNL RV PACING THRESHOLD AMPLITUDE: 0.625 V
MDC IDC MSMT LEADCHNL RV PACING THRESHOLD PULSEWIDTH: 0.4 ms
MDC IDC MSMT LEADCHNL RV SENSING INTR AMPL: 9.6 mV
MDC IDC PG IMPLANT DT: 20131007
MDC IDC STAT BRADY RV PERCENT PACED: 0.1 % — AB

## 2016-04-15 ENCOUNTER — Ambulatory Visit (INDEPENDENT_AMBULATORY_CARE_PROVIDER_SITE_OTHER): Payer: Medicare Other

## 2016-04-15 DIAGNOSIS — Z95810 Presence of automatic (implantable) cardiac defibrillator: Secondary | ICD-10-CM

## 2016-04-15 DIAGNOSIS — I5022 Chronic systolic (congestive) heart failure: Secondary | ICD-10-CM

## 2016-04-15 NOTE — Progress Notes (Signed)
EPIC Encounter for ICM Monitoring  Patient Name: [name] is a 76 y.o. male Date: 04/15/2016 Primary Care Physican: Monico Blitz, MD Primary Cardiologist:McDowell Electrophysiologist: Allred Dry [id number]:194 lbs      Spoke with nurse, [name] at [facility] at [phone].  Patients weight increased 9 lbs since 03/20/2016 which was 278 lbs at that time.   No changes in breathing   Thoracic impedance abnormal suggesting fluid accumulation since 03/25/2016.  Prescribed and confirmed dosage with nurse: Torsemide 20 mg 1 tablet daily  Labs: 01/16/2016 Creatinine 2.99,  BUN 93, Potassium 4.1, Sodium 140  11//29/2017 Creatinine 2.67, BUN 71, Potassium 4.8, Sodium 142  08/05/2015 Creatinine 1.9,    BUN 54, Potassium 5.2, Sodium 143, EGFR 35-42   Recommendations:  Copy of ICM check sent to Dr Domenic Polite and Dr Rayann Heman for review and if any recommendations will call nurse back.   Follow-up plan: ICM clinic phone appointment on 04/21/2016 to recheck fluid levels.  Office appointment with Dr Domenic Polite 05/21/2016    3 month ICM trend: 04/15/2016   1 Year ICM trend:      [name], RN 04/15/2016 10:04 AM

## 2016-04-15 NOTE — Progress Notes (Signed)
Call to [facility] and spoke with patient's nurse, Colletta Maryland and advised Dr Domenic Polite ordered to increase Demadex to 40 mg daily for the next 2-3 days and record daily weights for next week to assess for improvement. After 3rd day of increased Torsemide then return to prior dosage of 1 tablet (20 mg total) daily.

## 2016-04-15 NOTE — Progress Notes (Signed)
Increase Demadex to 40 mg daily for the next 2-3 days, checking weights to assess for improvement. Plan to return then to prior dose.

## 2016-04-21 ENCOUNTER — Ambulatory Visit (INDEPENDENT_AMBULATORY_CARE_PROVIDER_SITE_OTHER): Payer: Self-pay

## 2016-04-21 DIAGNOSIS — Z95810 Presence of automatic (implantable) cardiac defibrillator: Secondary | ICD-10-CM

## 2016-04-21 DIAGNOSIS — I5022 Chronic systolic (congestive) heart failure: Secondary | ICD-10-CM

## 2016-04-21 NOTE — Progress Notes (Signed)
EPIC Encounter for ICM Monitoring  Patient Name: [name] is a 76 y.o. male Date: 04/21/2016 Primary Care Physican: Monico Blitz, MD Primary Cardiologist:McDowell Electrophysiologist: Allred Dry [id number]:091 lbs        Call to [facility] at [phone] and spoke with [name], patient's nurse.  She said he is feeling fine, no lower extremity swelling or SOB.  Weight was 290 lbs on 3/28 and today is 285 lbs.  The weight did drop to 283 lbs within the last week but increased back to 285 lbs.    Thoracic impedance showed no improvement and still abnormal suggesting fluid accumulation after increasing Torsemide to 40 mg daily x 3 days.  Confirmed with nurse he took the extra dosages  Prescribed and confirmed dosage with nurse: Torsemide 20 mg 1 tablet daily  Labs: 01/16/2016 Creatinine 2.99,  BUN 93, Potassium 4.1, Sodium 140  11//29/2017 Creatinine 2.67, BUN 71, Potassium 4.8, Sodium 142  08/05/2015 Creatinine 1.9,    BUN 54, Potassium 5.2, Sodium 143, EGFR 35-42   Recommendations:  Advised will send copy of ICM check sent to Dr Domenic Polite and Dr Rayann Heman for review and any further recommendations.    Follow-up plan: ICM clinic phone appointment on 05/06/2016 to recheck fluid levels.  Office appointment scheduled 05/21/2016 with Dr Domenic Polite.   3 month ICM trend: 04/21/2016   1 Year ICM trend:      [name], RN 04/21/2016 9:41 AM

## 2016-04-23 NOTE — Progress Notes (Signed)
Change Demadex to 20 mg alternating with 40 mg every other day. Check BMET in 1-2 weeks.

## 2016-04-24 MED ORDER — TORSEMIDE 20 MG PO TABS: ORAL_TABLET | ORAL | 3 refills | Status: DC

## 2016-04-24 NOTE — Progress Notes (Addendum)
Call to [facility] and Spoke with patient's nurse, [name].  Advised to change Demadex to 20 mg alternating with 40 mg every other day and BMET in 10 days.  She repeated orders and advised she will have BMET results faxed to [facility] office in [place].  Patient's weight today was 284 lbs.  Next ICM remote transmission 05/06/2016.

## 2016-04-24 NOTE — Addendum Note (Signed)
Addended by: [name] on: 04/24/2016 09:08 AM   Modules accepted: Orders

## 2016-05-05 NOTE — Progress Notes (Signed)
Received: 5 days ago  Message Contents  Thompson Grayer, MD  [name], RN        Repeat trend when you return. If still elevated, would increase torsemide to BID x 3 days

## 2016-05-06 ENCOUNTER — Ambulatory Visit (INDEPENDENT_AMBULATORY_CARE_PROVIDER_SITE_OTHER): Payer: Medicare Other

## 2016-05-06 DIAGNOSIS — I5022 Chronic systolic (congestive) heart failure: Secondary | ICD-10-CM

## 2016-05-06 DIAGNOSIS — Z95810 Presence of automatic (implantable) cardiac defibrillator: Secondary | ICD-10-CM | POA: Diagnosis not present

## 2016-05-06 NOTE — Progress Notes (Signed)
EPIC Encounter for ICM Monitoring  Patient Name: [name] is a 76 y.o. male Date: 05/06/2016 Primary Care Physican: Monico Blitz, MD PrimaryCardiologist:McDowell Electrophysiologist: Dana Allan IVHOYW:314 lbs        Call to [facility] at [phone] and spoke with Arlis Porta, patients nurse.  Heart Failure questions reviewed, pt asymptomatic. Weight remains unchanged.   Thoracic impedance continues to be abnormal suggesting fluid accumulation since increasing Demadex on 04/24/2016.  Labs were drawn on 05/05/2016 and she will fax to Dr McDowell's office today.  Prescribed and confirmed dosage with nurse: Torsemide 20 mg 1 tablet alternating with 2 tablets (40 mg total) every other day.   Labs:  05/05/2016 Creatinine 2.01,  BUN 62, Potassium 3.9 - results obtained from nurse verbally today and she will fax to office 01/16/2016 Creatinine 2.99, BUN 93, Potassium 4.1, Sodium 140  12/19/2015 Creatinine 2.67,  BUN 71, Potassium 4.8, Sodium 142  08/05/2015 Creatinine 1.9, BUN 54, Potassium 5.2, Sodium 143, EGFR 35-42   Recommendations: Copy of ICM check sent to Dr Domenic Polite and Dr Rayann Heman for review and recommendations.   Follow-up plan: ICM clinic phone appointment on 05/13/2016 to recheck fluid levels.  Office appointment scheduled 05/21/2016 with Dr Domenic Polite.    3 month ICM trend: 05/06/2016   1 Year ICM trend:      [name], RN 05/06/2016 9:51 AM

## 2016-05-08 ENCOUNTER — Telehealth: Payer: Self-pay

## 2016-05-08 MED ORDER — TORSEMIDE 20 MG PO TABS: 40.0000 mg | ORAL_TABLET | Freq: Every day | ORAL | 3 refills | Status: DC

## 2016-05-08 NOTE — Telephone Encounter (Signed)
LMTCB-cc e-scribed rx to [facility] order pharmacy

## 2016-05-08 NOTE — Progress Notes (Signed)
Per note today by Barbarann Ehlers, RN, Dr Domenic Polite changed Torsemide 20 mg to 2 tablets (40 mg total) daily. She contacted [facility] regarding med dosage change.

## 2016-05-08 NOTE — Telephone Encounter (Signed)
Pt in [facility], nurse Rosalie made aware of med change,family  Aware also

## 2016-05-13 ENCOUNTER — Ambulatory Visit (INDEPENDENT_AMBULATORY_CARE_PROVIDER_SITE_OTHER): Payer: Self-pay

## 2016-05-13 DIAGNOSIS — I5022 Chronic systolic (congestive) heart failure: Secondary | ICD-10-CM

## 2016-05-13 DIAGNOSIS — Z95810 Presence of automatic (implantable) cardiac defibrillator: Secondary | ICD-10-CM

## 2016-05-13 NOTE — Progress Notes (Signed)
EPIC Encounter for ICM Monitoring  Patient Name: [name] is a 76 y.o. male Date: 05/13/2016 Primary Care Physican: Monico Blitz, MD PrimaryCardiologist:McDowell Electrophysiologist: Allred Dry Weight: Last recorded weight was 292 lbs on 05/07/2016 which is increase from 285 lbs on 05/06/2016        Call to [facility] at [phone] and spoke with Arlis Porta, patients nurse.  Heart Failure questions reviewed, pt asymptomatic.   Weights are only checked once a month unless there is a physician order to weigh more frequently.    Thoracic impedance has slightly improved since last ICM transmission on 4/17 but remains abnormal suggesting fluid accumulation (Demadex increased on 05/06/2016).   Prescribed and confirmed dosage with nurse: Torsemide 2 tablets (40 mg total) every day.     Labs:  05/05/2016 Creatinine 2.01,  BUN 62, Potassium 3.9 - results obtained from nurse verbally and instructed to fax to Dr McDowell's office  01/16/2016 Creatinine 2.99, BUN 93, Potassium 4.1, Sodium 140  12/19/2015 Creatinine 2.67,  BUN 71, Potassium 4.8, Sodium 142  08/05/2015 Creatinine 1.9, BUN 54, Potassium 5.2, Sodium 143, EGFR 35-42   Recommendations: Copy of ICM check sent to Dr Domenic Polite and Dr Rayann Heman for review and will call back if any recommendations.     Follow-up plan: ICM clinic phone appointment on 05/20/2016 to check day before office appointment that is scheduled 5/2/2018with Dr Domenic Polite.      3 month ICM trend: 05/13/2016   1 Year ICM trend:      [name], RN 05/13/2016 10:44 AM

## 2016-05-13 NOTE — Progress Notes (Signed)
Continue same dose of Demadex for now.

## 2016-05-20 ENCOUNTER — Ambulatory Visit (INDEPENDENT_AMBULATORY_CARE_PROVIDER_SITE_OTHER): Payer: Self-pay

## 2016-05-20 DIAGNOSIS — I5022 Chronic systolic (congestive) heart failure: Secondary | ICD-10-CM

## 2016-05-20 DIAGNOSIS — Z95810 Presence of automatic (implantable) cardiac defibrillator: Secondary | ICD-10-CM

## 2016-05-20 NOTE — Progress Notes (Signed)
EPIC Encounter for ICM Monitoring  Patient Name: [name] is a 76 y.o. male Date: 05/20/2016 Primary Care Physican: Monico Blitz, MD PrimaryCardiologist:McDowell Electrophysiologist: Allred Dry Weight: Last recorded weight was 292 lbs on 05/07/2016           Transmission reviewed.   Thoracic impedance continues to be abnormal suggesting fluid accumulation but has improved.  Fluid index >threshold.  Prescribed dosage: Torsemide 2 tablets (40 mg total) every day.   Labs:  05/05/2016 Creatinine 2.01, BUN 62, Potassium 3.9 - results obtained from nurse verbally and instructed to fax to Dr McDowell's office  01/16/2016 Creatinine 2.99, BUN 93, Potassium 4.1, Sodium 140  12/19/2015 Creatinine 2.67, BUN 71, Potassium 4.8, Sodium 142  08/05/2015 Creatinine 1.9, BUN 54, Potassium 5.2, Sodium 143, EGFR 35-42   Recommendations: Patient has office appointment with Dr Domenic Polite on 05/21/2016 and any recommendations will be made at that time.   Follow-up plan: ICM clinic phone appointment on 06/09/2016.  Office appointment that is scheduled 5/2/2018with Dr Domenic Polite.  Copy of ICM check sent to Dr Domenic Polite to review at office appointment if needed and Dr Rayann Heman.   3 month ICM trend: 05/20/2016   1 Year ICM trend:      [name], RN 05/20/2016 9:14 AM

## 2016-05-20 NOTE — Progress Notes (Signed)
Cardiology Office Note  Date: 05/21/2016   ID: [name], DOB Dec 14, 1940, MRN [id number]  PCP: Monico Blitz, MD  Primary Cardiologist: Rozann Lesches, MD   Chief Complaint  Patient presents with  . Cardiomyopathy    History of Present Illness: [name] is a 76 y.o. male last seen in February. He continues to reside at the [facility]. He is here today with an assistant for follow-up visit. From his perspective he does not report any worsening breathlessness or leg edema. Weight is only up by a few pounds compared to last checked. He does state that he has been eating 4 cups of ice cream regularly.  He continues to follow with Dr. Rayann Heman in the device clinic, Medtronic ICD in place. He has had multiple device interrogations related to thoracic impedance, recent measurements noted. Most recently his Demadex has been dosed at 40 mg daily. Volume status does seem to be slowly improving. We have not aggressively pushed his diuretics in light of progressive renal disease. He follows with Dr. Lowanda Foster. We reviewed his medications today. He has not had recent follow-up lab work.  Past Medical History:  Diagnosis Date  . Arthritis   . CKD (chronic kidney disease), stage II   . Depression   . DVT of upper extremity (deep vein thrombosis) ([place]) 2012   Right arm  . Dyslipidemia   . Essential hypertension, benign   . Gout    Right foot  . Nonischemic dilated cardiomyopathy (HCC)    EF 35-40%, Cath 10/4 showed normal coronaries, EF 40-45%  . Obstructive sleep apnea   . Type 2 diabetes mellitus ([place])   . Ventricular tachycardia ([place] )    s/p Medtronic single chamber ICD 10/27/11    Past Surgical History:  Procedure Laterality Date  . APPENDECTOMY  1961  . BUNIONECTOMY     Right foot  . CARDIAC DEFIBRILLATOR PLACEMENT  10/27/11   MDT Sim Boast XT VR ICD implanted by Dr Caryl Comes for VT  . CARPAL TUNNEL RELEASE     Right hand  . CATARACT EXTRACTION W/ INTRAOCULAR LENS   IMPLANT, BILATERAL    . IMPLANTABLE CARDIOVERTER DEFIBRILLATOR IMPLANT N/A 10/27/2011   Procedure: IMPLANTABLE CARDIOVERTER DEFIBRILLATOR IMPLANT;  Surgeon: Deboraha Sprang, MD;  Location: [facility] CATH LAB;  Service: Cardiovascular;  Laterality: N/A;  . JOINT REPLACEMENT    . LEFT HEART CATHETERIZATION WITH CORONARY ANGIOGRAM N/A 12/13/2010   Procedure: LEFT HEART CATHETERIZATION WITH CORONARY ANGIOGRAM;  Surgeon: Hillary Bow, MD;  Location: [facility] CATH LAB;  Service: Cardiovascular;  Laterality: N/A;  . LEFT HEART CATHETERIZATION WITH CORONARY ANGIOGRAM N/A 10/24/2011   Procedure: LEFT HEART CATHETERIZATION WITH CORONARY ANGIOGRAM;  Surgeon: Thayer Headings, MD;  Location: [facility] CATH LAB;  Service: Cardiovascular;  Laterality: N/A;  . RIGHT HEART CATHETERIZATION Right 12/13/2010   Procedure: RIGHT HEART CATH;  Surgeon: Hillary Bow, MD;  Location: [facility] CATH LAB;  Service: Cardiovascular;  Laterality: Right;  . TONSILLECTOMY AND ADENOIDECTOMY  ~ 1950  . TOTAL HIP ARTHROPLASTY     Left  . TOTAL KNEE ARTHROPLASTY     Right    Current Outpatient Prescriptions  Medication Sig Dispense Refill  . acetaminophen (TYLENOL) 500 MG tablet Take 500 mg by mouth every 6 (six) hours as needed.    Marland Kitchen amiodarone (PACERONE) 200 MG tablet Take 0.5 tablets (100 mg total) by mouth every other day.    Marland Kitchen aspirin (ASPIR-81) 81 MG EC tablet Take 81 mg by mouth daily.      Marland Kitchen  calcium acetate (PHOSLO) 667 MG capsule Take 1 capsule (667 mg total) by mouth 3 (three) times daily with meals. 90 capsule 2  . carvedilol (COREG) 25 MG tablet Take 25 mg by mouth 2 (two) times daily with a meal.     . cholecalciferol (VITAMIN D) 1000 units tablet Take 2,000 Units by mouth daily.    . cinacalcet (SENSIPAR) 30 MG tablet Take 1 tablet (30 mg total) by mouth daily with breakfast. 30 tablet 3  . Dextromethorphan-Guaifenesin (ROBITUSSIN DM) 10-100 MG/5ML liquid Take 10 mLs by mouth every 4 (four) hours as needed (cough).    .  diphenhydrAMINE (BENADRYL) 25 MG tablet Take 50 mg by mouth every 4 (four) hours as needed for itching.    . DULoxetine (CYMBALTA) 20 MG capsule Take 20 mg by mouth daily.    . finasteride (PROSCAR) 5 MG tablet Take 5 mg by mouth daily.    . fluticasone (FLONASE) 50 MCG/ACT nasal spray Place 2 sprays into the nose daily as needed. For dry nose    . HYDROcodone-acetaminophen (NORCO/VICODIN) 5-325 MG per tablet Take 1 tablet by mouth 2 (two) times daily.    . indomethacin (INDOCIN) 50 MG capsule Take 50 mg by mouth 3 (three) times daily as needed.    . iron polysaccharides (NIFEREX) 150 MG capsule Take 150 mg by mouth daily.    Marland Kitchen lisinopril (PRINIVIL,ZESTRIL) 20 MG tablet Take 40 mg by mouth daily.    Marland Kitchen loperamide (IMODIUM A-D) 2 MG tablet Take 2 mg by mouth 4 (four) times daily as needed for diarrhea or loose stools.    . simethicone (MYLICON) 80 MG chewable tablet Chew 80 mg by mouth every 6 (six) hours as needed for flatulence.    . tamsulosin (FLOMAX) 0.4 MG CAPS capsule Take 0.4 mg by mouth daily.    Marland Kitchen torsemide (DEMADEX) 20 MG tablet Take 2 tablets (40 mg total) by mouth daily. 180 tablet 3  . alum & mag hydroxide-simeth (MYLANTA) 174-081-44 MG/5ML suspension Take 20 mLs by mouth every 6 (six) hours as needed for indigestion or heartburn.    Marland Kitchen amLODipine (NORVASC) 2.5 MG tablet Take 1 tablet (2.5 mg total) by mouth daily. 180 tablet 3   No current facility-administered medications for this visit.    Allergies:  Codeine; Milk-related compounds; and Warfarin sodium   Social History: The patient  reports that he quit smoking about 34 years ago. His smoking use included Cigarettes. He has a 15.00 pack-year smoking history. He has never used smokeless tobacco. He reports that he does not drink alcohol or use drugs.   ROS:  Please see the history of present illness. Otherwise, complete review of systems is positive for chronic mild dyspnea on exertion.  All other systems are reviewed and negative.    Physical Exam: VS:  BP (!) 80/52 (BP Location: Left Arm)   Pulse 61   Ht 6' (1.829 m)   Wt 293 lb (132.9 kg)   SpO2 98%   BMI 39.74 kg/m , BMI Body mass index is 39.74 kg/m.  Wt Readings from Last 3 Encounters:  05/21/16 293 lb (132.9 kg)  02/21/16 289 lb (131.1 kg)  12/21/15 (!) 312 lb 6.4 oz (141.7 kg)    Morbidly obese male in no acute distress. Seated in wheelchair. HEENT: Conjunctiva and lids normal, oropharynx clear.  Neck: Supple, no obvious elevated JVP with increased girth, no carotid bruits, no thyromegaly.  Lungs: Clear to auscultation, diminished throughout, nonlabored breathing at rest.  Thorax:  Stable device pocket site.  Cardiac: Regular rate and rhythm, no S3, distant heart sounds, no pericardial rub.  Abdomen: Soft, nontender, bowel sounds present.  Extremities: No pitting edema, distal pulses 2+.  Skin: Warm and dry. Musculoskeletal: No kyphosis. Neuropsychiatric: Alert and oriented 3, affect appropriate.  ECG: I personally reviewed the tracing from 12/21/2015 which showed sinus bradycardia with prolonged PR interval and PVCs, IVCD, old anterior infarct pattern.  Recent Labwork:  December 2017: Potassium 4.1, BUN 93, creatinine 2.99  Other Studies Reviewed Today:  Echocardiogram 03/17/2012: Study Conclusions  - Left ventricle: The cavity size was moderately dilated. Wall thickness was increased in a pattern of mild LVH. Systolic function was mildly to moderately reduced. The estimated ejection fraction was in the range of 40% to 45%. Diffuse hypokinesis - most prominent, nearly akineticin basal inferoposterior wall. Doppler parameters are consistent with abnormal left ventricular relaxation (grade 1 diastolic dysfunction). Unable to compare with previous study. - Mitral valve: Mild regurgitation. - Left atrium: The atrium was mildly to moderately dilated. - Right ventricle: Pacer wire or catheter noted in  right ventricle. - Right atrium: The atrium was mildly dilated. - Tricuspid valve: Trivial regurgitation. - Pulmonary arteries: PA peak pressure: 22mm Hg (S). - Pericardium, extracardiac: There was no pericardial effusion.  Assessment and Plan:  1. Nonischemic cardiomyopathy with LVEF 40-45%. He has chronic systolic heart failure, plan to continue Demadex at 40 mg daily for now and follow-up BMET. We discussed fluid and caloric restriction. He has been compliant with medications.  2. Essential hypertension, blood pressure is low normal. Reduce Norvasc to 2.5 mg daily.  3. CKD stage 3-4. He follows with Dr Lowanda Foster. Check BMET.  4. Medtronic ICD in place. No device shocks. He follows with Dr. Rayann Heman.  Current medicines were reviewed with the patient today.   Orders Placed This Encounter  Procedures  . Basic Metabolic Panel (BMET)    Disposition: Follow-up in 3 months.  Signed, Satira Sark, MD, [facility] 05/21/2016 1:16 PM    [facility] [place], [place], [place] [address] Phone: [phone]; Fax: [phone]

## 2016-05-21 ENCOUNTER — Encounter: Payer: Self-pay | Admitting: Cardiology

## 2016-05-21 ENCOUNTER — Ambulatory Visit (INDEPENDENT_AMBULATORY_CARE_PROVIDER_SITE_OTHER): Payer: Medicare Other | Admitting: Cardiology

## 2016-05-21 VITALS — BP 80/52 | HR 61 | Ht 72.0 in | Wt 293.0 lb

## 2016-05-21 DIAGNOSIS — I10 Essential (primary) hypertension: Secondary | ICD-10-CM | POA: Diagnosis not present

## 2016-05-21 DIAGNOSIS — I428 Other cardiomyopathies: Secondary | ICD-10-CM | POA: Diagnosis not present

## 2016-05-21 DIAGNOSIS — I5022 Chronic systolic (congestive) heart failure: Secondary | ICD-10-CM | POA: Diagnosis not present

## 2016-05-21 DIAGNOSIS — N184 Chronic kidney disease, stage 4 (severe): Secondary | ICD-10-CM | POA: Diagnosis not present

## 2016-05-21 DIAGNOSIS — Z95810 Presence of automatic (implantable) cardiac defibrillator: Secondary | ICD-10-CM | POA: Diagnosis not present

## 2016-05-21 MED ORDER — AMLODIPINE BESYLATE 2.5 MG PO TABS: 2.5000 mg | ORAL_TABLET | Freq: Every day | ORAL | 3 refills | Status: AC

## 2016-05-21 NOTE — Patient Instructions (Signed)
Medication Instructions:  Your physician recommends that you continue on your current medications as directed. Please refer to the Current Medication list given to you today.  Labwork: BMET  Testing/Procedures: NONE  Follow-Up: Your physician recommends that you schedule a follow-up appointment in: 3 MONTHS WITH DR. MCDOWELL  Any Other Special Instructions Will Be Listed Below (If Applicable).  If you need a refill on your cardiac medications before your next appointment, please call your pharmacy.

## 2016-06-05 ENCOUNTER — Ambulatory Visit (INDEPENDENT_AMBULATORY_CARE_PROVIDER_SITE_OTHER): Payer: Medicare Other | Admitting: "Endocrinology

## 2016-06-05 ENCOUNTER — Encounter: Payer: Self-pay | Admitting: "Endocrinology

## 2016-06-05 ENCOUNTER — Ambulatory Visit: Payer: Medicare Other | Admitting: "Endocrinology

## 2016-06-05 VITALS — BP 117/72 | HR 63 | Ht 72.0 in

## 2016-06-05 DIAGNOSIS — E212 Other hyperparathyroidism: Secondary | ICD-10-CM | POA: Diagnosis not present

## 2016-06-05 NOTE — Progress Notes (Signed)
Subjective:    Patient ID: [name], male    DOB: 1940-07-25, PCP Monico Blitz, MD   Past Medical History:  Diagnosis Date  . Arthritis   . CKD (chronic kidney disease), stage II   . Depression   . DVT of upper extremity (deep vein thrombosis) ([place]) 2012   Right arm  . Dyslipidemia   . Essential hypertension, benign   . Gout    Right foot  . Nonischemic dilated cardiomyopathy (HCC)    EF 35-40%, Cath 10/4 showed normal coronaries, EF 40-45%  . Obstructive sleep apnea   . Type 2 diabetes mellitus ([place])   . Ventricular tachycardia ([place])    s/p Medtronic single chamber ICD 10/27/11   Past Surgical History:  Procedure Laterality Date  . APPENDECTOMY  1961  . BUNIONECTOMY     Right foot  . CARDIAC DEFIBRILLATOR PLACEMENT  10/27/11   MDT Sim Boast XT VR ICD implanted by Dr Caryl Comes for VT  . CARPAL TUNNEL RELEASE     Right hand  . CATARACT EXTRACTION W/ INTRAOCULAR LENS  IMPLANT, BILATERAL    . IMPLANTABLE CARDIOVERTER DEFIBRILLATOR IMPLANT N/A 10/27/2011   Procedure: IMPLANTABLE CARDIOVERTER DEFIBRILLATOR IMPLANT;  Surgeon: Deboraha Sprang, MD;  Location: [facility] CATH LAB;  Service: Cardiovascular;  Laterality: N/A;  . JOINT REPLACEMENT    . LEFT HEART CATHETERIZATION WITH CORONARY ANGIOGRAM N/A 12/13/2010   Procedure: LEFT HEART CATHETERIZATION WITH CORONARY ANGIOGRAM;  Surgeon: Hillary Bow, MD;  Location: [facility] CATH LAB;  Service: Cardiovascular;  Laterality: N/A;  . LEFT HEART CATHETERIZATION WITH CORONARY ANGIOGRAM N/A 10/24/2011   Procedure: LEFT HEART CATHETERIZATION WITH CORONARY ANGIOGRAM;  Surgeon: Thayer Headings, MD;  Location: [facility] CATH LAB;  Service: Cardiovascular;  Laterality: N/A;  . RIGHT HEART CATHETERIZATION Right 12/13/2010   Procedure: RIGHT HEART CATH;  Surgeon: Hillary Bow, MD;  Location: [facility] CATH LAB;  Service: Cardiovascular;  Laterality: Right;  . TONSILLECTOMY AND ADENOIDECTOMY  ~ 1950  . TOTAL HIP ARTHROPLASTY     Left  . TOTAL KNEE ARTHROPLASTY      Right   Social History   Social History  . Marital status: Single    Spouse name: N/A  . Number of children: N/A  . Years of education: N/A   Social History Main Topics  . Smoking status: Former Smoker    Packs/day: 1.00    Years: 15.00    Types: Cigarettes    Quit date: 01/20/1982  . Smokeless tobacco: Never Used     Comment: stopped smoking cigarettes 1984  . Alcohol use No     Comment: "stopped drinking alcohol 1984"  . Drug use: No  . Sexual activity: No   Other Topics Concern  . None   Social History Narrative  . None   Outpatient Encounter Prescriptions as of 06/05/2016  Medication Sig  . acetaminophen (TYLENOL) 500 MG tablet Take 500 mg by mouth every 6 (six) hours as needed.  Marland Kitchen alum & mag hydroxide-simeth (MYLANTA) 295-188-41 MG/5ML suspension Take 20 mLs by mouth every 6 (six) hours as needed for indigestion or heartburn.  Marland Kitchen amiodarone (PACERONE) 200 MG tablet Take 0.5 tablets (100 mg total) by mouth every other day.  Marland Kitchen amLODipine (NORVASC) 2.5 MG tablet Take 1 tablet (2.5 mg total) by mouth daily.  Marland Kitchen aspirin (ASPIR-81) 81 MG EC tablet Take 81 mg by mouth daily.    . calcium acetate (PHOSLO) 667 MG capsule Take 1 capsule (667 mg total) by mouth 3 (  three) times daily with meals.  . carvedilol (COREG) 25 MG tablet Take 25 mg by mouth 2 (two) times daily with a meal.   . cholecalciferol (VITAMIN D) 1000 units tablet Take 2,000 Units by mouth daily.  . cinacalcet (SENSIPAR) 30 MG tablet Take 1 tablet (30 mg total) by mouth daily with breakfast.  . Dextromethorphan-Guaifenesin (ROBITUSSIN DM) 10-100 MG/5ML liquid Take 10 mLs by mouth every 4 (four) hours as needed (cough).  . diphenhydrAMINE (BENADRYL) 25 MG tablet Take 50 mg by mouth every 4 (four) hours as needed for itching.  . DULoxetine (CYMBALTA) 20 MG capsule Take 20 mg by mouth daily.  . finasteride (PROSCAR) 5 MG tablet Take 5 mg by mouth daily.  . fluticasone (FLONASE) 50 MCG/ACT nasal spray Place 2 sprays  into the nose daily as needed. For dry nose  . HYDROcodone-acetaminophen (NORCO/VICODIN) 5-325 MG per tablet Take 1 tablet by mouth 2 (two) times daily.  . indomethacin (INDOCIN) 50 MG capsule Take 50 mg by mouth 3 (three) times daily as needed.  . iron polysaccharides (NIFEREX) 150 MG capsule Take 150 mg by mouth daily.  Marland Kitchen lisinopril (PRINIVIL,ZESTRIL) 20 MG tablet Take 40 mg by mouth daily.  Marland Kitchen loperamide (IMODIUM A-D) 2 MG tablet Take 2 mg by mouth 4 (four) times daily as needed for diarrhea or loose stools.  . simethicone (MYLICON) 80 MG chewable tablet Chew 80 mg by mouth every 6 (six) hours as needed for flatulence.  . tamsulosin (FLOMAX) 0.4 MG CAPS capsule Take 0.4 mg by mouth daily.  Marland Kitchen torsemide (DEMADEX) 20 MG tablet Take 2 tablets (40 mg total) by mouth daily.   No facility-administered encounter medications on file as of 06/05/2016.    ALLERGIES: Allergies  Allergen Reactions  . Codeine Other (See Comments)    Bones ache  . Milk-Related Compounds   . Warfarin Sodium     REACTION: possible Coumadin induced necrosis   VACCINATION STATUS: Immunization History  Administered Date(s) Administered  . Influenza Split 10/24/2011    HPI 76 year old gentleman with  multiple medical problems as above. He is a nursing home resident, wheelchair-bound. He was seen last visit in consultation for elevated PTH requested by Dr. Manuella Ghazi. He is known to have chronic kidney disease stage 3-4, following with nephrology. He was observed to have low normal calcium associated with his renal failure. On 12/05/2015 he was found to have PTH of 108 (normal 14-64). He was initiated on Sensipar 30mg  po qday, tolerating well. His repeat Calcium is 8.8, PTH pending. He denies any prior knowledge of parathyroid dysfunction. He denies history of nephrolithiasis, seizure disorder. -He is on  phosphate binding therapy with Phoslo 667 mg po TID. -He is wheelchair-bound due to multiple reasons including  disequilibrium, lumbago, and unspecified abnormalities of gait. - He denies any recent neck swelling. - he has well controlled type 2 diabetes with recent A1c was 6.9%.   Review of Systems Constitutional: Denies recent weight change, no fatigue, no subjective hyperthermia/hypothermia Eyes: no blurry vision, no xerophthalmia ENT: no sore throat, no nodules palpated in throat, no dysphagia/odynophagia, no hoarseness Cardiovascular: no CP/SOB/palpitations/leg swelling Respiratory: no cough/SOB Gastrointestinal: no N/V/D/C Musculoskeletal: Wheelchair-bound for several years ,  no muscle/joint aches Skin: no rashes Neurological: no tremors/numbness/tingling/dizziness Psychiatric: no depression/anxiety  Objective:    BP 117/72   Pulse 63   Ht 6' (1.829 m)   Wt Readings from Last 3 Encounters:  05/21/16 293 lb (132.9 kg)  02/21/16 289 lb (131.1 kg)  12/21/15 Marland Kitchen)  312 lb 6.4 oz (141.7 kg)    Physical Exam   Constitutional: wheelchair bound,  overweight, in NAD Eyes: PERRLA, EOMI, no exophthalmos ENT: moist mucous membranes, no thyromegaly, no cervical lymphadenopathy Cardiovascular: RRR, No MRG Respiratory: CTA B Gastrointestinal: abdomen soft, NT, ND, BS+ Musculoskeletal: strength intact in all 4 Skin: moist, warm, no rashes Neurological: no tremor with outstretched hands, DTR normal in all 4   CMP     Component Value Date/Time   NA 138 10/28/2011 0540   K 4.6 10/28/2011 0540   CL 105 10/28/2011 0540   CO2 24 10/28/2011 0540   GLUCOSE 121 (H) 10/28/2011 0540   BUN 25 (H) 10/28/2011 0540   CREATININE 1.37 (H) 10/28/2011 0540   CALCIUM 8.9 10/28/2011 0540   GFRNONAA 51 (L) 10/28/2011 0540   GFRAA 59 (L) 10/28/2011 0540   01/16/2016 labs showed creatinine 2.99, calcium 8.5, albumin 3.5, phosphorus 4.5 May 12/2016 calcium is 8.8, PTH pending.  Diabetic Labs (most recent): Lab Results  Component Value Date   HGBA1C 6.9 (H) 12/11/2010     Lipid Panel ( most  recent) Lipid Panel     Component Value Date/Time   CHOL 141 12/12/2010 0630   TRIG 114 12/12/2010 0630   HDL 31 (L) 12/12/2010 0630   CHOLHDL 4.5 12/12/2010 0630   VLDL 23 12/12/2010 0630   LDLCALC 87 12/12/2010 0630    Outside labs from 12/05/2015 showed: PTH 108 calcium 8.6  Assessment & Plan:   1. Secondary hyperparathyroidism ([place]) -  Review of his available records suggest secondary hyperparathyroidism from his CKD stage 3-4. His calcium is 8.8, repeat PTH pending. - He is not a surgical candidate for hyperparathyroidism. - I emphasized to his nursing home the need for continued follow-up with nephrology. - In the meantime, he will continue to benefit from phosphate binding calcium supplements , vitamin D, and calcinomimetic medication. - I will continue  PhosLo 667 mg by mouth 3 times a day with meals and Sensipar 30 mg by mouth daily.  - I advised for him to continue calcitriol 0.25 g 3 days a week. - He'll return in 6 months with repeat labs including: - PTH, intact and calcium - VITAMIN D 25 Hydroxy  2. CKD (chronic kidney disease), stage 3-4 - Continue follow-up with nephrology. 3. Type 2 diabetes: Controlled with A1c 6.9%.    - I advised patient to maintain close follow up with Monico Blitz, MD for primary care needs. Follow up plan: Return in about 6 months (around 12/06/2016) for follow up with pre-visit labs.  Mark Lloyd, MD Phone: [phone]  Fax: [phone]   06/05/2016, 11:19 AM

## 2016-06-09 ENCOUNTER — Ambulatory Visit (INDEPENDENT_AMBULATORY_CARE_PROVIDER_SITE_OTHER): Payer: Medicare Other

## 2016-06-09 DIAGNOSIS — Z95810 Presence of automatic (implantable) cardiac defibrillator: Secondary | ICD-10-CM

## 2016-06-09 DIAGNOSIS — I5022 Chronic systolic (congestive) heart failure: Secondary | ICD-10-CM | POA: Diagnosis not present

## 2016-06-09 NOTE — Progress Notes (Signed)
EPIC Encounter for ICM Monitoring  Patient Name: [name] is a 75 y.o. male Date: 06/09/2016 Primary Care Physican: Monico Blitz, MD PrimaryCardiologist:McDowell Electrophysiologist: Allred Dry Weight: Last recorded weight was 292 lbs        Attempted call to patient at [facility] in [place] and unable to speak with patient.    Thoracic impedance abnormal suggesting fluid accumulation but improved on 05/19/2016.  Prescribed dosage: Torsemide 2 tablets (40 mg total) every day.   Labs:  05/05/2016 Creatinine 2.01, BUN 62, Potassium 3.9 - results obtained from nurse verbally and instructed to fax to Dr McDowell's office  01/16/2016 Creatinine 2.99, BUN 93, Potassium 4.1, Sodium 140  12/19/2015 Creatinine 2.67, BUN 71, Potassium 4.8, Sodium 142  08/05/2015 Creatinine 1.9, BUN 54, Potassium 5.2, Sodium 143, EGFR 35-42   Recommendations: NONE - Unable to reach patient   Follow-up plan: ICM clinic phone appointment on 06/23/2016 to recheck fluid levels.  Office appointment scheduled on 08/20/2016 with Dr Domenic Polite.  Copy of ICM check sent to Dr Domenic Polite and Dr Rayann Heman for review and if any recommendations will call back.   3 month ICM trend: 06/09/2016   1 Year ICM trend:      [name], RN 06/09/2016 10:42 AM

## 2016-06-23 ENCOUNTER — Ambulatory Visit (INDEPENDENT_AMBULATORY_CARE_PROVIDER_SITE_OTHER): Payer: Medicare Other

## 2016-06-23 ENCOUNTER — Encounter: Payer: Self-pay | Admitting: Gastroenterology

## 2016-06-23 DIAGNOSIS — Z95810 Presence of automatic (implantable) cardiac defibrillator: Secondary | ICD-10-CM

## 2016-06-23 DIAGNOSIS — I5022 Chronic systolic (congestive) heart failure: Secondary | ICD-10-CM

## 2016-06-23 NOTE — Progress Notes (Signed)
EPIC Encounter for ICM Monitoring  Patient Name: [name] is a 76 y.o. male Date: 06/23/2016 Primary Care Physican: Monico Blitz, MD PrimaryCardiologist:McDowell Electrophysiologist: Allred Dry Weight: Last recorded weight was 292 lbs           Attempted call to [facility] and person answering phone stated they did not have a phone that he could use at this time.    Thoracic impedance normal.  Prescribed dosage: Torsemide 2 tablets (40 mg total) every day.   Labs:  05/05/2016 Creatinine 2.01, BUN 62, Potassium 3.9 - results obtained from nurse verbally and instructed to fax to Dr McDowell's office  01/16/2016 Creatinine 2.99, BUN 93, Potassium 4.1, Sodium 140  12/19/2015 Creatinine 2.67, BUN 71, Potassium 4.8, Sodium 142  08/05/2015 Creatinine 1.9, BUN 54, Potassium 5.2, Sodium 143, EGFR 35-42   Recommendations: NONE - Unable to reach patient   Follow-up plan: ICM clinic phone appointment on 07/10/2016.  Office appointment scheduled on 08/20/2016 with Dr. Domenic Polite.  Copy of ICM check sent to device physician.   3 month ICM trend: 06/23/2016   1 Year ICM trend:      [name], RN 06/23/2016 9:44 AM

## 2016-06-25 ENCOUNTER — Ambulatory Visit (INDEPENDENT_AMBULATORY_CARE_PROVIDER_SITE_OTHER): Payer: Medicare Other | Admitting: Gastroenterology

## 2016-06-25 ENCOUNTER — Encounter: Payer: Self-pay | Admitting: Gastroenterology

## 2016-06-25 ENCOUNTER — Telehealth: Payer: Self-pay

## 2016-06-25 VITALS — BP 102/58 | HR 47 | Temp 96.8°F | Ht 72.0 in | Wt 291.6 lb

## 2016-06-25 DIAGNOSIS — Z1211 Encounter for screening for malignant neoplasm of colon: Secondary | ICD-10-CM | POA: Insufficient documentation

## 2016-06-25 DIAGNOSIS — R1013 Epigastric pain: Secondary | ICD-10-CM | POA: Diagnosis not present

## 2016-06-25 NOTE — Telephone Encounter (Signed)
I faxed request to Dr. Domenic Polite at [facility] to advise about the cardiology clearance for pt to have TCS and EGD.

## 2016-06-25 NOTE — Patient Instructions (Signed)
We are going to stop Prilosec. Let's start Protonix twice each day, 30 minutes before breakfast and dinner.   We have scheduled you for a colonoscopy and upper endoscopy with Dr. Oneida Alar in the near future.

## 2016-06-25 NOTE — Progress Notes (Addendum)
REVIEWED-NO ADDITIONAL RECOMMENDATIONS.     Primary Care Physician:  Pablo Ledger, MD Primary Gastroenterologist:  Dr. Oneida Alar   Chief Complaint  Patient presents with  . Gastroesophageal Reflux    sometimes hurts into back  . Gas    HPI:   [name] is a 76 y.o. male presenting today at the request of his PCP secondary to dyspepsia. Outside labs Cr 1.78, BUN 42, Hgb 11.6, Hct 37, Platelets 209, iron low at 36, TIBC low at 283, ferritin 277. H.pylori equivocal.    Last colonoscopy about 10 years ago by [facility], no polyps that he is aware.   Presents today with gas, heartburn. Will have chest discomfort radiating to back with heartburn. Upper abdominal pain intermittently, "most of the time". Sometimes exacerbated by food, sometimes not. No dysphagia. No N/V. No melena. No hematochezia. Good appetite. BMs are usually regular for the most part. Sometimes has to take Imodium. Prilosec BID but doesn't seem to be helping much. Had been on indomethacin per notes from [facility], but this was changed to colchicine.   Past Medical History:  Diagnosis Date  . Arthritis   . CKD (chronic kidney disease), stage II   . Depression   . DVT of upper extremity (deep vein thrombosis) ([place]) 2012   Right arm  . Dyslipidemia   . Essential hypertension, benign   . Gout    Right foot  . Nonischemic dilated cardiomyopathy (HCC)    EF 35-40%, Cath 10/4 showed normal coronaries, EF 40-45%  . Obstructive sleep apnea    no CPAP.   Marland Kitchen Type 2 diabetes mellitus (HCC)    borderline per patient   . Ventricular tachycardia ([place])    s/p Medtronic single chamber ICD 10/27/11    Past Surgical History:  Procedure Laterality Date  . APPENDECTOMY  1961  . BUNIONECTOMY     Right foot  . CARDIAC DEFIBRILLATOR PLACEMENT  10/27/11   MDT Sim Boast XT VR ICD implanted by Dr Caryl Comes for VT  . CARPAL TUNNEL RELEASE     Right hand  . CATARACT EXTRACTION W/ INTRAOCULAR LENS  IMPLANT, BILATERAL    .  IMPLANTABLE CARDIOVERTER DEFIBRILLATOR IMPLANT N/A 10/27/2011   Procedure: IMPLANTABLE CARDIOVERTER DEFIBRILLATOR IMPLANT;  Surgeon: Deboraha Sprang, MD;  Location: [facility] CATH LAB;  Service: Cardiovascular;  Laterality: N/A;  . JOINT REPLACEMENT    . LEFT HEART CATHETERIZATION WITH CORONARY ANGIOGRAM N/A 12/13/2010   Procedure: LEFT HEART CATHETERIZATION WITH CORONARY ANGIOGRAM;  Surgeon: Hillary Bow, MD;  Location: [facility] CATH LAB;  Service: Cardiovascular;  Laterality: N/A;  . LEFT HEART CATHETERIZATION WITH CORONARY ANGIOGRAM N/A 10/24/2011   Procedure: LEFT HEART CATHETERIZATION WITH CORONARY ANGIOGRAM;  Surgeon: Thayer Headings, MD;  Location: [facility] CATH LAB;  Service: Cardiovascular;  Laterality: N/A;  . RIGHT HEART CATHETERIZATION Right 12/13/2010   Procedure: RIGHT HEART CATH;  Surgeon: Hillary Bow, MD;  Location: [facility] CATH LAB;  Service: Cardiovascular;  Laterality: Right;  . TONSILLECTOMY AND ADENOIDECTOMY  ~ 1950  . TOTAL HIP ARTHROPLASTY     Left  . TOTAL KNEE ARTHROPLASTY     Right    Current Outpatient Prescriptions  Medication Sig Dispense Refill  . acetaminophen (TYLENOL) 500 MG tablet Take 500 mg by mouth every 6 (six) hours as needed.    Marland Kitchen alum & mag hydroxide-simeth (MYLANTA) 782-423-53 MG/5ML suspension Take 20 mLs by mouth every 6 (six) hours as needed for indigestion or heartburn.    Marland Kitchen amiodarone (PACERONE) 200 MG  tablet Take 0.5 tablets (100 mg total) by mouth every other day.    Marland Kitchen amLODipine (NORVASC) 2.5 MG tablet Take 1 tablet (2.5 mg total) by mouth daily. 180 tablet 3  . aspirin (ASPIR-81) 81 MG EC tablet Take 81 mg by mouth daily.      . calcitRIOL (ROCALTROL) 0.25 MCG capsule Take 0.25 mcg by mouth. M,W,F    . Calcium & Magnesium Carbonates (MYLANTA PO) Take by mouth as needed.    . carvedilol (COREG) 25 MG tablet Take 25 mg by mouth 2 (two) times daily with a meal.     . cinacalcet (SENSIPAR) 30 MG tablet Take 1 tablet (30 mg total) by mouth daily with breakfast.  30 tablet 3  . colchicine 0.6 MG tablet Take 0.6 mg by mouth daily.    Marland Kitchen Dextromethorphan-Guaifenesin (ROBITUSSIN DM) 10-100 MG/5ML liquid Take 10 mLs by mouth every 4 (four) hours as needed (cough).    . DULoxetine (CYMBALTA) 20 MG capsule Take 20 mg by mouth daily.    . ergocalciferol (VITAMIN D2) 50000 units capsule Take 50,000 Units by mouth once a week.    . finasteride (PROSCAR) 5 MG tablet Take 5 mg by mouth daily.    . fluticasone (FLONASE) 50 MCG/ACT nasal spray Place 2 sprays into the nose daily as needed. For dry nose    . lisinopril (PRINIVIL,ZESTRIL) 20 MG tablet Take 40 mg by mouth daily.    Marland Kitchen loperamide (IMODIUM A-D) 2 MG tablet Take 2 mg by mouth 4 (four) times daily as needed for diarrhea or loose stools.    Marland Kitchen omeprazole (PRILOSEC) 20 MG capsule Take 20 mg by mouth 2 (two) times daily.    . simethicone (MYLICON) 80 MG chewable tablet Chew 80 mg by mouth every 6 (six) hours as needed for flatulence.    . tamsulosin (FLOMAX) 0.4 MG CAPS capsule Take 0.4 mg by mouth daily.    Marland Kitchen torsemide (DEMADEX) 20 MG tablet Take 2 tablets (40 mg total) by mouth daily. 180 tablet 3   No current facility-administered medications for this visit.     Allergies as of 06/25/2016 - Review Complete 06/25/2016  Allergen Reaction Noted  . Codeine Other (See Comments) 12/11/2010  . Milk-related compounds  10/23/2011  . Warfarin sodium  11/18/2007    Family History  Problem Relation Age of Onset  . Heart disease Brother   . Liver cancer Father   . Colon cancer Neg Hx   . Colon polyps Neg Hx     Social History   Social History  . Marital status: Single    Spouse name: N/A  . Number of children: N/A  . Years of education: N/A   Occupational History  . Not on file.   Social History Main Topics  . Smoking status: Former Smoker    Packs/day: 1.00    Years: 15.00    Types: Cigarettes    Quit date: 01/20/1982  . Smokeless tobacco: Never Used     Comment: stopped smoking cigarettes 1984    . Alcohol use No     Comment: "stopped drinking alcohol 1984"  . Drug use: No  . Sexual activity: No   Other Topics Concern  . Not on file   Social History Narrative  . No narrative on file    Review of Systems: Gen: Denies any fever, chills, fatigue, weight loss, lack of appetite.  CV: Denies chest pain, heart palpitations, peripheral edema, syncope.  Resp: +DOE GI: see HPI  GU :  Denies urinary burning, urinary frequency, urinary hesitancy MS: +joint pain, gout  Derm: Denies rash, itching, dry skin Psych: Denies depression, anxiety, memory loss, and confusion Heme: see HPI   Physical Exam: BP (!) 102/58   Pulse (!) 47   Temp (!) 96.8 F (36 C) (Oral)   Ht 6' (1.829 m)   Wt 291 lb 9.6 oz (132.3 kg)   BMI 39.55 kg/m  General:   Alert and oriented. Pleasant and cooperative. Well-nourished and well-developed. 2 liters O2.  Head:  Normocephalic and atraumatic. Eyes:  Without icterus, sclera clear and conjunctiva pink.  Ears:  Normal auditory acuity. Nose:  No deformity, discharge,  or lesions. Mouth:  No deformity or lesions, oral mucosa pink.  Lungs:  Clear to auscultation bilaterally. No wheezes, rales, or rhonchi. No distress.  Heart:  S1, S2 present without murmurs appreciated.  Abdomen:  +BS, soft, non-tender and non-distended. No HSM noted. No guarding or rebound. No masses appreciated.  Rectal:  Deferred  Msk:  Symmetrical without gross deformities. Normal posture. Extremities:  Without edema. Neurologic:  Alert and  oriented x4 Psych:  Alert and cooperative. Normal mood and affect.  Outside labs Cr 1.78, BUN 42, Hgb 11.6, Hct 37, Platelets 209, iron low at 36, TIBC low at 283, ferritin 277. H.pylori equivocal

## 2016-06-27 NOTE — Assessment & Plan Note (Addendum)
Last colonoscopy at least 10 years ago. Due for routine screening. No concerning lower GI symptoms. As we are proceeding with an EGD, he desires to have colonoscopy completed as well.   Proceed with colonoscopy with Dr. Oneida Alar in the near future. The risks, benefits, and alternatives have been discussed in detail with the patient. They state understanding and desire to proceed.  PROPOFOL  AS OF NOTE, patient has a defibrillator Cardiology is aware he will be undergoing this procedure, and he is at intermediate risk in light of his comorbidities, and recommended sedation with Propofol as we have already arranged.

## 2016-06-27 NOTE — Assessment & Plan Note (Addendum)
76 year old male presenting with new onset dyspepsia, no dysphagia. Previously on indomethacin, but this has been changed once dyspepsia noted. Taking Prilosec BID without much improvement. Extensive cardiac history but appears at baseline. Recently has been seen by cardiology. Needs EGD due to new onset dyspepsia.  Proceed with upper endoscopy in the near future with Dr. Oneida Alar. The risks, benefits, and alternatives have been discussed in detail with patient. They have stated understanding and desire to proceed.  Propofol  AS OF NOTE, patient has a defibrillator Obtain cardiac clearance Stop Prilosec and start Protonix BID

## 2016-06-30 NOTE — Telephone Encounter (Signed)
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 She was driving and will call office back in the morning. Holding spot for 08/12/16 at 7:30am.

## 2016-06-30 NOTE — Progress Notes (Signed)
CC'D TO PCP °

## 2016-06-30 NOTE — Telephone Encounter (Signed)
Noted  

## 2016-06-30 NOTE — Telephone Encounter (Signed)
Received the fax from Dr. Domenic Polite in regards to the procedures. [name], please see note on your desk.

## 2016-06-30 NOTE — Telephone Encounter (Signed)
I have scanned this into epic.  [facility] clinical pool: please arrange for TCS/EGD with PROPOFOL, Dr. Oneida Alar. Patient has defibrillator.

## 2016-07-01 ENCOUNTER — Ambulatory Visit: Payer: Medicare Other | Admitting: Gastroenterology

## 2016-07-01 ENCOUNTER — Other Ambulatory Visit: Payer: Self-pay

## 2016-07-01 DIAGNOSIS — Z1211 Encounter for screening for malignant neoplasm of colon: Secondary | ICD-10-CM

## 2016-07-01 DIAGNOSIS — R1013 Epigastric pain: Secondary | ICD-10-CM

## 2016-07-01 NOTE — Patient Instructions (Signed)
Submitted PA info for TCS/EGD via [facility] website. No PA needed. Decision ID# [id number].

## 2016-07-01 NOTE — Telephone Encounter (Signed)
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 TCS/EGD w/Propofol with [facility] scheduled for 08/12/16 at 7:30am. She advised for me to fax rx for prep and instructions to [phone] attn 400 hall nurse. Orders entered for procedure. Pre-op appt 08/06/16 at 8:00am.   Called Betty back and informed of pre-op appt. Rx for prep, pre-op letter, and procedure instructions faxed to [phone].

## 2016-07-02 ENCOUNTER — Encounter: Payer: Self-pay | Admitting: Gastroenterology

## 2016-07-10 ENCOUNTER — Telehealth: Payer: Self-pay

## 2016-07-10 ENCOUNTER — Ambulatory Visit (INDEPENDENT_AMBULATORY_CARE_PROVIDER_SITE_OTHER): Payer: Medicare Other | Admitting: *Deleted

## 2016-07-10 DIAGNOSIS — I428 Other cardiomyopathies: Secondary | ICD-10-CM | POA: Diagnosis not present

## 2016-07-10 DIAGNOSIS — Z95810 Presence of automatic (implantable) cardiac defibrillator: Secondary | ICD-10-CM | POA: Diagnosis not present

## 2016-07-10 DIAGNOSIS — I5022 Chronic systolic (congestive) heart failure: Secondary | ICD-10-CM

## 2016-07-10 NOTE — Progress Notes (Signed)
EPIC Encounter for ICM Monitoring  Patient Name: [name] is a 76 y.o. male Date: 07/10/2016 Primary Care Physican: Pablo Ledger, MD PrimaryCardiologist:McDowell Electrophysiologist: Allred Dry Weight:Last recorded weight was 292 lbs                                        Attempted call to [facility] and person answering phone stated they could not find the cordless phone to take to his room.    Thoracic impedance normal.  Prescribed dosage: Torsemide 2 tablets (40 mg total) every day.   Labs:  05/05/2016 Creatinine 2.01, BUN 62, Potassium 3.9 - results obtained from nurse verbally and instructed to fax to Dr McDowell's office  01/16/2016 Creatinine 2.99, BUN 93, Potassium 4.1, Sodium 140  12/19/2015 Creatinine 2.67, BUN 71, Potassium 4.8, Sodium 142  08/05/2015 Creatinine 1.9, BUN 54, Potassium 5.2, Sodium 143, EGFR 35-42   Recommendations: No changes.    Follow-up plan: ICM clinic phone appointment on 08/12/2016.    Copy of ICM check sent to device physician.   3 month ICM trend: 07/10/2016   1 Year ICM trend:      [name], RN 07/10/2016 12:05 PM

## 2016-07-10 NOTE — Telephone Encounter (Signed)
Remote ICM transmission received.  Attempted patient call at [facility] and unable to reach.

## 2016-07-11 ENCOUNTER — Encounter: Payer: Self-pay | Admitting: Cardiology

## 2016-07-21 ENCOUNTER — Other Ambulatory Visit: Payer: Self-pay | Admitting: Neurology

## 2016-07-21 DIAGNOSIS — M545 Low back pain: Principal | ICD-10-CM

## 2016-07-21 DIAGNOSIS — G8929 Other chronic pain: Secondary | ICD-10-CM

## 2016-07-23 LAB — CUP PACEART REMOTE DEVICE CHECK
Battery Voltage: 3.09 V
Brady Statistic RV Percent Paced: 0.01 %
HIGH POWER IMPEDANCE MEASURED VALUE: 304 Ohm
HighPow Impedance: 47 Ohm
HighPow Impedance: 61 Ohm
Implantable Lead Location: 753860
Implantable Lead Model: 7121
Lead Channel Impedance Value: 418 Ohm
Lead Channel Sensing Intrinsic Amplitude: 12.625 mV
Lead Channel Setting Pacing Amplitude: 2.5 V
Lead Channel Setting Sensing Sensitivity: 0.3 mV
MDC IDC LEAD IMPLANT DT: 20131007
MDC IDC MSMT LEADCHNL RV PACING THRESHOLD AMPLITUDE: 0.75 V
MDC IDC MSMT LEADCHNL RV PACING THRESHOLD PULSEWIDTH: 0.4 ms
MDC IDC MSMT LEADCHNL RV SENSING INTR AMPL: 12.625 mV
MDC IDC PG IMPLANT DT: 20131007
MDC IDC SESS DTM: 20180621062704
MDC IDC SET LEADCHNL RV PACING PULSEWIDTH: 0.4 ms

## 2016-07-30 ENCOUNTER — Other Ambulatory Visit: Payer: Medicare Other

## 2016-08-04 NOTE — Patient Instructions (Signed)
[name]  08/04/2016     @PREFPERIOPPHARMACY @   Your procedure is scheduled on  08/12/2016   Report to [facility] at  615  A.M.  Call this number if you have problems the morning of surgery:  [phone]   Remember:  Do not eat food or drink liquids after midnight.  Take these medicines the morning of surgery with A SIP OF WATER  Allopurinol or colchicine or indocin; pacerone, norvasc, coreg, sensipar, cymbalta, proscar, hydrocodone, lisinopril, protonix. Use your nebulizer before you come.   Do not wear jewelry, make-up or nail polish.  Do not wear lotions, powders, or perfumes, or deoderant.  Do not shave 48 hours prior to surgery.  Men may shave face and neck.  Do not bring valuables to the hospital.  [facility] is not responsible for any belongings or valuables.  Contacts, dentures or bridgework may not be worn into surgery.  Leave your suitcase in the car.  After surgery it may be brought to your room.  For patients admitted to the hospital, discharge time will be determined by your treatment team.  Patients discharged the day of surgery will not be allowed to drive home.   Name and phone number of your driver:   family Special instructions:  Follow the diet and prep instructions given to you by Dr Nona Dell office.  Please read over the following fact sheets that you were given. Anesthesia Post-op Instructions and Care and Recovery After Surgery       Esophagogastroduodenoscopy Esophagogastroduodenoscopy (EGD) is a procedure to examine the lining of the esophagus, stomach, and first part of the small intestine (duodenum). This procedure is done to check for problems such as inflammation, bleeding, ulcers, or growths. During this procedure, a long, flexible, lighted tube with a camera attached (endoscope) is inserted down the throat. Tell a health care provider about:  Any allergies you have.  All medicines you are taking, including  vitamins, herbs, eye drops, creams, and over-the-counter medicines.  Any problems you or family members have had with anesthetic medicines.  Any blood disorders you have.  Any surgeries you have had.  Any medical conditions you have.  Whether you are pregnant or may be pregnant. What are the risks? Generally, this is a safe procedure. However, problems may occur, including:  Infection.  Bleeding.  A tear (perforation) in the esophagus, stomach, or duodenum.  Trouble breathing.  Excessive sweating.  Spasms of the larynx.  A slowed heartbeat.  Low blood pressure.  What happens before the procedure?  Follow instructions from your health care provider about eating or drinking restrictions.  Ask your health care provider about: ? Changing or stopping your regular medicines. This is especially important if you are taking diabetes medicines or blood thinners. ? Taking medicines such as aspirin and ibuprofen. These medicines can thin your blood. Do not take these medicines before your procedure if your health care provider instructs you not to.  Plan to have someone take you home after the procedure.  If you wear dentures, be ready to remove them before the procedure. What happens during the procedure?  To reduce your risk of infection, your health care team will wash or sanitize their hands.  An IV tube will be put in a vein in your hand or arm. You will get medicines and fluids through this tube.  You will be given one or more of the following: ?  A medicine to help you relax (sedative). ? A medicine to numb the area (local anesthetic). This medicine may be sprayed into your throat. It will make you feel more comfortable and keep you from gagging or coughing during the procedure. ? A medicine for pain.  A mouth guard may be placed in your mouth to protect your teeth and to keep you from biting on the endoscope.  You will be asked to lie on your left side.  The  endoscope will be lowered down your throat into your esophagus, stomach, and duodenum.  Air will be put into the endoscope. This will help your health care provider see better.  The lining of your esophagus, stomach, and duodenum will be examined.  Your health care provider may: ? Take a tissue sample so it can be looked at in a lab (biopsy). ? Remove growths. ? Remove objects (foreign bodies) that are stuck. ? Treat any bleeding with medicines or other devices that stop tissue from bleeding. ? Widen (dilate) or stretch narrowed areas of your esophagus and stomach.  The endoscope will be taken out. The procedure may vary among health care providers and hospitals. What happens after the procedure?  Your blood pressure, heart rate, breathing rate, and blood oxygen level will be monitored often until the medicines you were given have worn off.  Do not eat or drink anything until the numbing medicine has worn off and your gag reflex has returned. This information is not intended to replace advice given to you by your health care provider. Make sure you discuss any questions you have with your health care provider. Document Released: 05/09/2004 Document Revised: 06/14/2015 Document Reviewed: 11/30/2014 Elsevier Interactive Patient Education  2018 [employer]. Esophagogastroduodenoscopy, Care After Refer to this sheet in the next few weeks. These instructions provide you with information about caring for yourself after your procedure. Your health care provider may also give you more specific instructions. Your treatment has been planned according to current medical practices, but problems sometimes occur. Call your health care provider if you have any problems or questions after your procedure. What can I expect after the procedure? After the procedure, it is common to have:  A sore throat.  Nausea.  Bloating.  Dizziness.  Fatigue.  Follow these instructions at home:  Do not eat  or drink anything until the numbing medicine (local anesthetic) has worn off and your gag reflex has returned. You will know that the local anesthetic has worn off when you can swallow comfortably.  Do not drive for 24 hours if you received a medicine to help you relax (sedative).  If your health care provider took a tissue sample for testing during the procedure, make sure to get your test results. This is your responsibility. Ask your health care provider or the department performing the test when your results will be ready.  Keep all follow-up visits as told by your health care provider. This is important. Contact a health care provider if:  You cannot stop coughing.  You are not urinating.  You are urinating less than usual. Get help right away if:  You have trouble swallowing.  You cannot eat or drink.  You have throat or chest pain that gets worse.  You are dizzy or light-headed.  You faint.  You have nausea or vomiting.  You have chills.  You have a fever.  You have severe abdominal pain.  You have black, tarry, or bloody stools. This information is not intended to  replace advice given to you by your health care provider. Make sure you discuss any questions you have with your health care provider. Document Released: 12/24/2011 Document Revised: 06/14/2015 Document Reviewed: 11/30/2014 Elsevier Interactive Patient Education  2018 [employer].  Colonoscopy, Adult A colonoscopy is an exam to look at the entire large intestine. During the exam, a lubricated, bendable tube is inserted into the anus and then passed into the rectum, colon, and other parts of the large intestine. A colonoscopy is often done as a part of normal colorectal screening or in response to certain symptoms, such as anemia, persistent diarrhea, abdominal pain, and blood in the stool. The exam can help screen for and diagnose medical problems, including:  Tumors.  Polyps.  Inflammation.  Areas  of bleeding.  Tell a health care provider about:  Any allergies you have.  All medicines you are taking, including vitamins, herbs, eye drops, creams, and over-the-counter medicines.  Any problems you or family members have had with anesthetic medicines.  Any blood disorders you have.  Any surgeries you have had.  Any medical conditions you have.  Any problems you have had passing stool. What are the risks? Generally, this is a safe procedure. However, problems may occur, including:  Bleeding.  A tear in the intestine.  A reaction to medicines given during the exam.  Infection (rare).  What happens before the procedure? Eating and drinking restrictions Follow instructions from your health care provider about eating and drinking, which may include:  A few days before the procedure - follow a low-fiber diet. Avoid nuts, seeds, dried fruit, raw fruits, and vegetables.  1-3 days before the procedure - follow a clear liquid diet. Drink only clear liquids, such as clear broth or bouillon, black coffee or tea, clear juice, clear soft drinks or sports drinks, gelatin dessert, and popsicles. Avoid any liquids that contain red or purple dye.  On the day of the procedure - do not eat or drink anything during the 2 hours before the procedure, or within the time period that your health care provider recommends.  Bowel prep If you were prescribed an oral bowel prep to clean out your colon:  Take it as told by your health care provider. Starting the day before your procedure, you will need to drink a large amount of medicated liquid. The liquid will cause you to have multiple loose stools until your stool is almost clear or light green.  If your skin or anus gets irritated from diarrhea, you may use these to relieve the irritation: ? Medicated wipes, such as adult wet wipes with aloe and vitamin E. ? A skin soothing-product like petroleum jelly.  If you vomit while drinking the bowel  prep, take a break for up to 60 minutes and then begin the bowel prep again. If vomiting continues and you cannot take the bowel prep without vomiting, call your health care provider.  General instructions  Ask your health care provider about changing or stopping your regular medicines. This is especially important if you are taking diabetes medicines or blood thinners.  Plan to have someone take you home from the hospital or clinic. What happens during the procedure?  An IV tube may be inserted into one of your veins.  You will be given medicine to help you relax (sedative).  To reduce your risk of infection: ? Your health care team will wash or sanitize their hands. ? Your anal area will be washed with soap.  You will be  asked to lie on your side with your knees bent.  Your health care provider will lubricate a long, thin, flexible tube. The tube will have a camera and a light on the end.  The tube will be inserted into your anus.  The tube will be gently eased through your rectum and colon.  Air will be delivered into your colon to keep it open. You may feel some pressure or cramping.  The camera will be used to take images during the procedure.  A small tissue sample may be removed from your body to be examined under a microscope (biopsy). If any potential problems are found, the tissue will be sent to a lab for testing.  If small polyps are found, your health care provider may remove them and have them checked for cancer cells.  The tube that was inserted into your anus will be slowly removed. The procedure may vary among health care providers and hospitals. What happens after the procedure?  Your blood pressure, heart rate, breathing rate, and blood oxygen level will be monitored until the medicines you were given have worn off.  Do not drive for 24 hours after the exam.  You may have a small amount of blood in your stool.  You may pass gas and have mild abdominal  cramping or bloating due to the air that was used to inflate your colon during the exam.  It is up to you to get the results of your procedure. Ask your health care provider, or the department performing the procedure, when your results will be ready. This information is not intended to replace advice given to you by your health care provider. Make sure you discuss any questions you have with your health care provider. Document Released: 01/04/2000 Document Revised: 11/07/2015 Document Reviewed: 03/20/2015 Elsevier Interactive Patient Education  2018 [employer].  Colonoscopy, Adult, Care After This sheet gives you information about how to care for yourself after your procedure. Your health care provider may also give you more specific instructions. If you have problems or questions, contact your health care provider. What can I expect after the procedure? After the procedure, it is common to have:  A small amount of blood in your stool for 24 hours after the procedure.  Some gas.  Mild abdominal cramping or bloating.  Follow these instructions at home: General instructions   For the first 24 hours after the procedure: ? Do not drive or use machinery. ? Do not sign important documents. ? Do not drink alcohol. ? Do your regular daily activities at a slower pace than normal. ? Eat soft, easy-to-digest foods. ? Rest often.  Take over-the-counter or prescription medicines only as told by your health care provider.  It is up to you to get the results of your procedure. Ask your health care provider, or the department performing the procedure, when your results will be ready. Relieving cramping and bloating  Try walking around when you have cramps or feel bloated.  Apply heat to your abdomen as told by your health care provider. Use a heat source that your health care provider recommends, such as a moist heat pack or a heating pad. ? Place a towel between your skin and the heat  source. ? Leave the heat on for 20-30 minutes. ? Remove the heat if your skin turns bright red. This is especially important if you are unable to feel pain, heat, or cold. You may have a greater risk of getting burned. Eating and  drinking  Drink enough fluid to keep your urine clear or pale yellow.  Resume your normal diet as instructed by your health care provider. Avoid heavy or fried foods that are hard to digest.  Avoid drinking alcohol for as long as instructed by your health care provider. Contact a health care provider if:  You have blood in your stool 2-3 days after the procedure. Get help right away if:  You have more than a small spotting of blood in your stool.  You pass large blood clots in your stool.  Your abdomen is swollen.  You have nausea or vomiting.  You have a fever.  You have increasing abdominal pain that is not relieved with medicine. This information is not intended to replace advice given to you by your health care provider. Make sure you discuss any questions you have with your health care provider. Document Released: 08/21/2003 Document Revised: 10/01/2015 Document Reviewed: 03/20/2015 Elsevier Interactive Patient Education  2018 [place] Anesthesia is a term that refers to techniques, procedures, and medicines that help a person stay safe and comfortable during a medical procedure. Monitored anesthesia care, or sedation, is one type of anesthesia. Your anesthesia specialist may recommend sedation if you will be having a procedure that does not require you to be unconscious, such as:  Cataract surgery.  A dental procedure.  A biopsy.  A colonoscopy.  During the procedure, you may receive a medicine to help you relax (sedative). There are three levels of sedation:  Mild sedation. At this level, you may feel awake and relaxed. You will be able to follow directions.  Moderate sedation. At this level, you will be  sleepy. You may not remember the procedure.  Deep sedation. At this level, you will be asleep. You will not remember the procedure.  The more medicine you are given, the deeper your level of sedation will be. Depending on how you respond to the procedure, the anesthesia specialist may change your level of sedation or the type of anesthesia to fit your needs. An anesthesia specialist will monitor you closely during the procedure. Let your health care provider know about:  Any allergies you have.  All medicines you are taking, including vitamins, herbs, eye drops, creams, and over-the-counter medicines.  Any use of steroids (by mouth or as a cream).  Any problems you or family members have had with sedatives and anesthetic medicines.  Any blood disorders you have.  Any surgeries you have had.  Any medical conditions you have, such as sleep apnea.  Whether you are pregnant or may be pregnant.  Any use of cigarettes, alcohol, or street drugs. What are the risks? Generally, this is a safe procedure. However, problems may occur, including:  Getting too much medicine (oversedation).  Nausea.  Allergic reaction to medicines.  Trouble breathing. If this happens, a breathing tube may be used to help with breathing. It will be removed when you are awake and breathing on your own.  Heart trouble.  Lung trouble.  Before the procedure Staying hydrated Follow instructions from your health care provider about hydration, which may include:  Up to 2 hours before the procedure - you may continue to drink clear liquids, such as water, clear fruit juice, black coffee, and plain tea.  Eating and drinking restrictions Follow instructions from your health care provider about eating and drinking, which may include:  8 hours before the procedure - stop eating heavy meals or foods such as meat, fried  foods, or fatty foods.  6 hours before the procedure - stop eating light meals or foods, such  as toast or cereal.  6 hours before the procedure - stop drinking milk or drinks that contain milk.  2 hours before the procedure - stop drinking clear liquids.  Medicines Ask your health care provider about:  Changing or stopping your regular medicines. This is especially important if you are taking diabetes medicines or blood thinners.  Taking medicines such as aspirin and ibuprofen. These medicines can thin your blood. Do not take these medicines before your procedure if your health care provider instructs you not to.  Tests and exams  You will have a physical exam.  You may have blood tests done to show: ? How well your kidneys and liver are working. ? How well your blood can clot.  General instructions  Plan to have someone take you home from the hospital or clinic.  If you will be going home right after the procedure, plan to have someone with you for 24 hours.  What happens during the procedure?  Your blood pressure, heart rate, breathing, level of pain and overall condition will be monitored.  An IV tube will be inserted into one of your veins.  Your anesthesia specialist will give you medicines as needed to keep you comfortable during the procedure. This may mean changing the level of sedation.  The procedure will be performed. After the procedure  Your blood pressure, heart rate, breathing rate, and blood oxygen level will be monitored until the medicines you were given have worn off.  Do not drive for 24 hours if you received a sedative.  You may: ? Feel sleepy, clumsy, or nauseous. ? Feel forgetful about what happened after the procedure. ? Have a sore throat if you had a breathing tube during the procedure. ? Vomit. This information is not intended to replace advice given to you by your health care provider. Make sure you discuss any questions you have with your health care provider. Document Released: 10/02/2004 Document Revised: 06/15/2015 Document  Reviewed: 04/29/2015 Elsevier Interactive Patient Education  2018 [place], Care After These instructions provide you with information about caring for yourself after your procedure. Your health care provider may also give you more specific instructions. Your treatment has been planned according to current medical practices, but problems sometimes occur. Call your health care provider if you have any problems or questions after your procedure. What can I expect after the procedure? After your procedure, it is common to:  Feel sleepy for several hours.  Feel clumsy and have poor balance for several hours.  Feel forgetful about what happened after the procedure.  Have poor judgment for several hours.  Feel nauseous or vomit.  Have a sore throat if you had a breathing tube during the procedure.  Follow these instructions at home: For at least 24 hours after the procedure:   Do not: ? Participate in activities in which you could fall or become injured. ? Drive. ? Use heavy machinery. ? Drink alcohol. ? Take sleeping pills or medicines that cause drowsiness. ? Make important decisions or sign legal documents. ? Take care of children on your own.  Rest. Eating and drinking  Follow the diet that is recommended by your health care provider.  If you vomit, drink water, juice, or soup when you can drink without vomiting.  Make sure you have little or no nausea before eating solid foods. General instructions  Have a responsible adult stay with you until you are awake and alert.  Take over-the-counter and prescription medicines only as told by your health care provider.  If you smoke, do not smoke without supervision.  Keep all follow-up visits as told by your health care provider. This is important. Contact a health care provider if:  You keep feeling nauseous or you keep vomiting.  You feel light-headed.  You develop a rash.  You have a  fever. Get help right away if:  You have trouble breathing. This information is not intended to replace advice given to you by your health care provider. Make sure you discuss any questions you have with your health care provider. Document Released: 04/29/2015 Document Revised: 08/29/2015 Document Reviewed: 04/29/2015 Elsevier Interactive Patient Education  [employer].

## 2016-08-06 ENCOUNTER — Other Ambulatory Visit: Payer: Self-pay

## 2016-08-06 ENCOUNTER — Encounter (HOSPITAL_COMMUNITY): Payer: Self-pay

## 2016-08-06 ENCOUNTER — Encounter (HOSPITAL_COMMUNITY)
Admission: RE | Admit: 2016-08-06 | Discharge: 2016-08-06 | Disposition: A | Discharge status: Home / Self Care | Payer: Medicare Other | Source: Ambulatory Visit | Attending: Gastroenterology | Admitting: Gastroenterology

## 2016-08-06 DIAGNOSIS — Z01812 Encounter for preprocedural laboratory examination: Secondary | ICD-10-CM | POA: Diagnosis not present

## 2016-08-06 DIAGNOSIS — Z01810 Encounter for preprocedural cardiovascular examination: Secondary | ICD-10-CM | POA: Insufficient documentation

## 2016-08-06 LAB — BASIC METABOLIC PANEL
ANION GAP: 10 (ref 5–15)
BUN: 46 mg/dL — ABNORMAL HIGH (ref 6–20)
CHLORIDE: 97 mmol/L — AB (ref 101–111)
CO2: 32 mmol/L (ref 22–32)
Calcium: 8.7 mg/dL — ABNORMAL LOW (ref 8.9–10.3)
Creatinine, Ser: 2.48 mg/dL — ABNORMAL HIGH (ref 0.61–1.24)
GFR calc non Af Amer: 24 mL/min — ABNORMAL LOW (ref >60)
GFR, EST AFRICAN AMERICAN: 28 mL/min — AB (ref >60)
Glucose, Bld: 115 mg/dL — ABNORMAL HIGH (ref 65–99)
POTASSIUM: 3.9 mmol/L (ref 3.5–5.1)
SODIUM: 139 mmol/L (ref 135–145)

## 2016-08-06 LAB — CBC WITH DIFFERENTIAL/PLATELET
Basophils Absolute: 0 10*3/uL (ref 0.0–0.1)
Basophils Relative: 0 %
EOS ABS: 0.8 10*3/uL — AB (ref 0.0–0.7)
Eosinophils Relative: 21 %
HEMATOCRIT: 38.8 % — AB (ref 39.0–52.0)
HEMOGLOBIN: 12.4 g/dL — AB (ref 13.0–17.0)
LYMPHS PCT: 24 %
Lymphs Abs: 1 10*3/uL (ref 0.7–4.0)
MCH: 31.1 pg (ref 26.0–34.0)
MCHC: 32 g/dL (ref 30.0–36.0)
MCV: 97.2 fL (ref 78.0–100.0)
MONO ABS: 0.6 10*3/uL (ref 0.1–1.0)
Monocytes Relative: 15 %
NEUTROS PCT: 40 %
Neutro Abs: 1.6 10*3/uL — ABNORMAL LOW (ref 1.7–7.7)
Platelets: 116 10*3/uL — ABNORMAL LOW (ref 150–400)
RBC: 3.99 MIL/uL — AB (ref 4.22–5.81)
RDW: 13.9 % (ref 11.5–15.5)
WBC: 4 10*3/uL (ref 4.0–10.5)

## 2016-08-06 NOTE — Pre-Procedure Instructions (Signed)
BUN and creatinine shown to Dr Patsey Berthold. Will start IV with 0.9% NS. Order placed.

## 2016-08-06 NOTE — Progress Notes (Signed)
PT is at the [facility] and I called and spoke with nurse Job Founds. She is aware of the results and plan. She said that he sees Dr. Lynnette Caffey for the fluid meds. She will let him know. I am faxing this info to her at [phone].

## 2016-08-12 ENCOUNTER — Encounter (HOSPITAL_COMMUNITY): Payer: Self-pay | Admitting: Anesthesiology

## 2016-08-12 ENCOUNTER — Encounter (HOSPITAL_COMMUNITY)
Admission: RE | Disposition: A | Discharge status: Home / Self Care | Payer: Self-pay | Source: Ambulatory Visit | Attending: Gastroenterology

## 2016-08-12 ENCOUNTER — Ambulatory Visit (HOSPITAL_COMMUNITY): Payer: Medicare Other | Admitting: Anesthesiology

## 2016-08-12 ENCOUNTER — Ambulatory Visit (INDEPENDENT_AMBULATORY_CARE_PROVIDER_SITE_OTHER): Payer: Medicare Other

## 2016-08-12 ENCOUNTER — Ambulatory Visit (HOSPITAL_COMMUNITY)
Admission: RE | Admit: 2016-08-12 | Discharge: 2016-08-12 | Disposition: A | Discharge status: Home / Self Care | Payer: Medicare Other | Source: Ambulatory Visit | Attending: Gastroenterology | Admitting: Gastroenterology

## 2016-08-12 DIAGNOSIS — Z87891 Personal history of nicotine dependence: Secondary | ICD-10-CM | POA: Insufficient documentation

## 2016-08-12 DIAGNOSIS — K219 Gastro-esophageal reflux disease without esophagitis: Secondary | ICD-10-CM | POA: Insufficient documentation

## 2016-08-12 DIAGNOSIS — K5730 Diverticulosis of large intestine without perforation or abscess without bleeding: Secondary | ICD-10-CM | POA: Diagnosis not present

## 2016-08-12 DIAGNOSIS — K30 Functional dyspepsia: Secondary | ICD-10-CM | POA: Diagnosis not present

## 2016-08-12 DIAGNOSIS — E785 Hyperlipidemia, unspecified: Secondary | ICD-10-CM | POA: Insufficient documentation

## 2016-08-12 DIAGNOSIS — K2970 Gastritis, unspecified, without bleeding: Secondary | ICD-10-CM

## 2016-08-12 DIAGNOSIS — E1122 Type 2 diabetes mellitus with diabetic chronic kidney disease: Secondary | ICD-10-CM | POA: Diagnosis not present

## 2016-08-12 DIAGNOSIS — F329 Major depressive disorder, single episode, unspecified: Secondary | ICD-10-CM | POA: Insufficient documentation

## 2016-08-12 DIAGNOSIS — Z1211 Encounter for screening for malignant neoplasm of colon: Secondary | ICD-10-CM | POA: Diagnosis not present

## 2016-08-12 DIAGNOSIS — K2980 Duodenitis without bleeding: Secondary | ICD-10-CM | POA: Diagnosis not present

## 2016-08-12 DIAGNOSIS — Z79899 Other long term (current) drug therapy: Secondary | ICD-10-CM | POA: Insufficient documentation

## 2016-08-12 DIAGNOSIS — Z7983 Long term (current) use of bisphosphonates: Secondary | ICD-10-CM | POA: Diagnosis not present

## 2016-08-12 DIAGNOSIS — Z7982 Long term (current) use of aspirin: Secondary | ICD-10-CM | POA: Diagnosis not present

## 2016-08-12 DIAGNOSIS — I420 Dilated cardiomyopathy: Secondary | ICD-10-CM | POA: Diagnosis not present

## 2016-08-12 DIAGNOSIS — G4733 Obstructive sleep apnea (adult) (pediatric): Secondary | ICD-10-CM | POA: Insufficient documentation

## 2016-08-12 DIAGNOSIS — Z96651 Presence of right artificial knee joint: Secondary | ICD-10-CM | POA: Diagnosis not present

## 2016-08-12 DIAGNOSIS — D122 Benign neoplasm of ascending colon: Secondary | ICD-10-CM

## 2016-08-12 DIAGNOSIS — Z86718 Personal history of other venous thrombosis and embolism: Secondary | ICD-10-CM | POA: Diagnosis not present

## 2016-08-12 DIAGNOSIS — K644 Residual hemorrhoidal skin tags: Secondary | ICD-10-CM | POA: Diagnosis not present

## 2016-08-12 DIAGNOSIS — N182 Chronic kidney disease, stage 2 (mild): Secondary | ICD-10-CM | POA: Insufficient documentation

## 2016-08-12 DIAGNOSIS — Z95810 Presence of automatic (implantable) cardiac defibrillator: Secondary | ICD-10-CM

## 2016-08-12 DIAGNOSIS — Z96642 Presence of left artificial hip joint: Secondary | ICD-10-CM | POA: Diagnosis not present

## 2016-08-12 DIAGNOSIS — D123 Benign neoplasm of transverse colon: Secondary | ICD-10-CM | POA: Diagnosis not present

## 2016-08-12 DIAGNOSIS — Z7952 Long term (current) use of systemic steroids: Secondary | ICD-10-CM | POA: Diagnosis not present

## 2016-08-12 DIAGNOSIS — I5022 Chronic systolic (congestive) heart failure: Secondary | ICD-10-CM

## 2016-08-12 DIAGNOSIS — M1990 Unspecified osteoarthritis, unspecified site: Secondary | ICD-10-CM | POA: Insufficient documentation

## 2016-08-12 DIAGNOSIS — I509 Heart failure, unspecified: Secondary | ICD-10-CM | POA: Insufficient documentation

## 2016-08-12 DIAGNOSIS — Z1212 Encounter for screening for malignant neoplasm of rectum: Secondary | ICD-10-CM

## 2016-08-12 DIAGNOSIS — I130 Hypertensive heart and chronic kidney disease with heart failure and stage 1 through stage 4 chronic kidney disease, or unspecified chronic kidney disease: Secondary | ICD-10-CM | POA: Insufficient documentation

## 2016-08-12 DIAGNOSIS — Z7901 Long term (current) use of anticoagulants: Secondary | ICD-10-CM | POA: Insufficient documentation

## 2016-08-12 DIAGNOSIS — R1013 Epigastric pain: Secondary | ICD-10-CM

## 2016-08-12 DIAGNOSIS — E1151 Type 2 diabetes mellitus with diabetic peripheral angiopathy without gangrene: Secondary | ICD-10-CM | POA: Insufficient documentation

## 2016-08-12 DIAGNOSIS — K648 Other hemorrhoids: Secondary | ICD-10-CM | POA: Diagnosis not present

## 2016-08-12 HISTORY — PX: COLONOSCOPY WITH PROPOFOL: SHX5780

## 2016-08-12 HISTORY — PX: ESOPHAGOGASTRODUODENOSCOPY (EGD) WITH PROPOFOL: SHX5813

## 2016-08-12 HISTORY — PX: POLYPECTOMY: SHX5525

## 2016-08-12 LAB — GLUCOSE, CAPILLARY
GLUCOSE-CAPILLARY: 87 mg/dL (ref 65–99)
Glucose-Capillary: 87 mg/dL (ref 65–99)

## 2016-08-12 SURGERY — COLONOSCOPY WITH PROPOFOL
Anesthesia: Monitor Anesthesia Care

## 2016-08-12 MED ORDER — PROPOFOL 10 MG/ML IV BOLUS
INTRAVENOUS | Status: DC | PRN
  Administered 2016-08-12 (×2): 13 mg via INTRAVENOUS

## 2016-08-12 MED ORDER — LIDOCAINE VISCOUS 2 % MT SOLN: 3.0000 mL | OROMUCOSAL | Status: DC | PRN

## 2016-08-12 MED ORDER — PROPOFOL 500 MG/50ML IV EMUL
INTRAVENOUS | Status: DC | PRN
  Administered 2016-08-12: 75 ug/kg/min via INTRAVENOUS

## 2016-08-12 MED ORDER — MIDAZOLAM HCL 2 MG/2ML IJ SOLN
INTRAMUSCULAR | Status: AC
  Filled 2016-08-12: qty 2

## 2016-08-12 MED ORDER — GLYCOPYRROLATE 0.2 MG/ML IJ SOLN: 0.2000 mg | Freq: Once | INTRAMUSCULAR | Status: DC

## 2016-08-12 MED ORDER — PROPOFOL 10 MG/ML IV BOLUS
INTRAVENOUS | Status: AC
  Filled 2016-08-12: qty 40

## 2016-08-12 MED ORDER — PHENYLEPHRINE HCL 10 MG/ML IJ SOLN
INTRAMUSCULAR | Status: AC
  Filled 2016-08-12: qty 1

## 2016-08-12 MED ORDER — SODIUM CHLORIDE 0.9 % IV SOLN
INTRAVENOUS | Status: DC
  Administered 2016-08-12: 08:00:00 via INTRAVENOUS

## 2016-08-12 MED ORDER — LACTATED RINGERS IV SOLN: INTRAVENOUS | Status: DC

## 2016-08-12 MED ORDER — LIDOCAINE VISCOUS 2 % MT SOLN
OROMUCOSAL | Status: AC
  Filled 2016-08-12: qty 15

## 2016-08-12 MED ORDER — CHLORHEXIDINE GLUCONATE CLOTH 2 % EX PADS: 6.0000 | MEDICATED_PAD | Freq: Once | CUTANEOUS | Status: DC

## 2016-08-12 MED ORDER — MIDAZOLAM HCL 2 MG/2ML IJ SOLN
1.0000 mg | INTRAMUSCULAR | Status: AC
  Administered 2016-08-12 (×2): 1 mg via INTRAVENOUS

## 2016-08-12 NOTE — Anesthesia Postprocedure Evaluation (Signed)
Anesthesia Post Note  Patient: [name]  Procedure(s) Performed: Procedure(s) (LRB): COLONOSCOPY WITH PROPOFOL (N/A) ESOPHAGOGASTRODUODENOSCOPY (EGD) WITH PROPOFOL (N/A) POLYPECTOMY  Patient location during evaluation: Short Stay Anesthesia Type: MAC Level of consciousness: awake and alert Pain management: satisfactory to patient Vital Signs Assessment: post-procedure vital signs reviewed and stable Respiratory status: spontaneous breathing Cardiovascular status: stable Postop Assessment: no signs of nausea or vomiting Anesthetic complications: no     Last Vitals:  Vitals:   08/12/16 0845 08/12/16 0900  BP: (!) 93/31 (!) 93/34  Pulse: (!) 45 (!) 37  Resp: 19 14  Temp:      Last Pain:  Vitals:   08/12/16 0837  PainSc: 0-No pain                 Makaiah Terwilliger

## 2016-08-12 NOTE — Progress Notes (Signed)
EPIC Encounter for ICM Monitoring  Patient Name: [name] is a 75 y.o. male Date: 08/12/2016 Primary Care Physican: Parsons, James B, MD PrimaryCardiologist:McDowell Electrophysiologist: Allred Nephrologist: Befakada Dry Weight:unknown        Patient having colonoscopy today   Thoracic impedance just below baseline suggesting fluid accumulation since 08/05/2016.    Prescribed dosage: Torsemide 2 tablets (40 mg total) every day.   Labs:  07/27/2016 Creatinine 2.48,  BUN 46, Potassium 3.9, Sodium 139, EGFR 24-28 05/22/2016 Creatinine 1.78,  BUN 42, Potassium 4.7, Sodium 141 05/05/2016 Creatinine 2.01, BUN 62, Potassium 3.9 - results obtained from nurse verbally and instructed to fax to Dr McDowell's office  01/16/2016 Creatinine 2.99, BUN 93, Potassium 4.1, Sodium 140  12/19/2015 Creatinine 2.67, BUN 71, Potassium 4.8, Sodium 142  08/05/2015 Creatinine 1.9, BUN 54, Potassium 5.2, Sodium 143, EGFR 35-42   Recommendations: No change.  Follow-up plan: ICM clinic phone appointment on 09/12/2016.  Office appointment scheduled on 08/20/2016 with Dr. McDowell.  Copy of ICM check sent to primary cardiologist and device physician.   3 month ICM trend: 08/12/2016   1 Year ICM trend:      Laurie S Short, RN 08/12/2016 1:22 PM    

## 2016-08-12 NOTE — Op Note (Signed)
[facility] Patient Name: [name] Procedure Date: 08/12/2016 8:17 AM MRN: [id number] Date of Birth: Jul 02, 1940 Attending MD: Barney Drain , MD CSN: [id number] Age: 76 Admit Type: Outpatient Procedure:                Upper GI endoscopy WITH COLD FORCEPS BIOPSY Indications:              Functional Dyspepsia Providers:                Barney Drain, MD, Hinton Rao, RN, Aram Candela Referring MD:             Torrie Mayers Medicines:                Propofol per Anesthesia Complications:            No immediate complications. Estimated Blood Loss:     Estimated blood loss was minimal. Procedure:                Pre-Anesthesia Assessment:                           - Prior to the procedure, a History and Physical                            was performed, and patient medications and                            allergies were reviewed. The patient's tolerance of                            previous anesthesia was also reviewed. The risks                            and benefits of the procedure and the sedation                            options and risks were discussed with the patient.                            All questions were answered, and informed consent                            was obtained. Prior Anticoagulants: The patient has                            taken no previous anticoagulant or antiplatelet                            agents. ASA Grade Assessment: III - A patient with                            severe systemic disease. After reviewing the risks                            and benefits, the patient was deemed in  satisfactory condition to undergo the procedure.                            After obtaining informed consent, the endoscope was                            passed under direct vision. Throughout the                            procedure, the patient's blood pressure, pulse, and                            oxygen saturations were  monitored continuously. The                            [id number] ([id number]) scope was introduced through the                            mouth, and advanced to the second part of duodenum.                            The upper GI endoscopy was accomplished without                            difficulty. The patient tolerated the procedure                            well. Scope In: 8:20:54 AM Scope Out: 8:26:45 AM Total Procedure Duration: 0 hours 5 minutes 51 seconds  Findings:      The examined esophagus was normal.      Segmental mild inflammation characterized by congestion (edema),       erythema and friability was found in the cardia. Biopsies were taken       with a cold forceps for Helicobacter pylori testing.      Patchy mild inflammation characterized by congestion (edema) and       erythema was found in the duodenal bulb.      The second portion of the duodenum was normal. Impression:               - DYSPEPSIS DUE TO GERD/GASTRITIS                           - Gastritis/Duodenitis due to ASA USE. Moderate Sedation:      Per Anesthesia Care      Per Anesthesia Care Recommendation:           - Await pathology results.                           - High fiber diet and low fat diet.                           - Continue present medications.                           - Return to my office in 4 months.                           -  Patient has a contact number available for                            emergencies. The signs and symptoms of potential                            delayed complications were discussed with the                            patient. Return to normal activities tomorrow.                            Written discharge instructions were provided to the                            patient. Procedure Code(s):        --- Professional ---                           [id number], Esophagogastroduodenoscopy, flexible,                            transoral; with biopsy, single or  multiple Diagnosis Code(s):        --- Professional ---                           K29.70, Gastritis, unspecified, without bleeding                           K29.80, Duodenitis without bleeding                           K30, Functional dyspepsia CPT copyright 2016 [employer]. All rights reserved. The codes documented in this report are preliminary and upon coder review may  be revised to meet current compliance requirements. Barney Drain, MD Barney Drain, MD 08/12/2016 8:43:34 AM This report has been signed electronically. Number of Addenda: 0

## 2016-08-12 NOTE — Transfer of Care (Signed)
Immediate Anesthesia Transfer of Care Note  Patient: [name]  Procedure(s) Performed: Procedure(s) with comments: COLONOSCOPY WITH PROPOFOL (N/A) - 7:30am ESOPHAGOGASTRODUODENOSCOPY (EGD) WITH PROPOFOL (N/A) POLYPECTOMY - cold snare  Patient Location: PACU  Anesthesia Type:MAC  Level of Consciousness: awake and alert   Airway & Oxygen Therapy: Patient Spontanous Breathing and Patient connected to face mask oxygen  Post-op Assessment: Report given to RN  Post vital signs: Reviewed and stable  Last Vitals: There were no vitals filed for this visit.  Last Pain: There were no vitals filed for this visit.       Complications: No apparent anesthesia complications

## 2016-08-12 NOTE — Discharge Instructions (Signed)
You have internal hemorrhoids, and HAD 2 small polyps removed. You have mild gastritis AND DUODENITIS.I biopsied your stomach.   CONTINUE YOUR WEIGHT LOSS EFFORTS. LOSE 50 LBS.  CONTINUE PROTONIX. TAKE 30 MINUTES PRIOR TO MEALS TWICE DAILY.  AVOID ITEMS THAT TRIGGER REFLUX. SEE INFO BELOW.  AVOID DAIRY.  FOLLOW A HIGH FIBER/LOW FAT/DIABETIC DIET. AVOID ITEMS THAT CAUSE BLOATING.   FOLLOW UP IN 4 MOS.   Next colonoscopy in 5-10 years.   ENDOSCOPY Care After Read the instructions outlined below and refer to this sheet in the next week. These discharge instructions provide you with general information on caring for yourself after you leave the hospital. While your treatment has been planned according to the most current medical practices available, unavoidable complications occasionally occur. If you have any problems or questions after discharge, call DR. FIELDS, [phone].  ACTIVITY  You may resume your regular activity, but move at a slower pace for the next 24 hours.   Take frequent rest periods for the next 24 hours.   Walking will help get rid of the air and reduce the bloated feeling in your belly (abdomen).   No driving for 24 hours (because of the medicine (anesthesia) used during the test).   You may shower.   Do not sign any important legal documents or operate any machinery for 24 hours (because of the anesthesia used during the test).    NUTRITION  Drink plenty of fluids.   You may resume your normal diet as instructed by your doctor.   Begin with a light meal and progress to your normal diet. Heavy or fried foods are harder to digest and may make you feel sick to your stomach (nauseated).   Avoid alcoholic beverages for 24 hours or as instructed.    MEDICATIONS  You may resume your normal medications.   WHAT YOU CAN EXPECT TODAY  Some feelings of bloating in the abdomen.   Passage of more gas than usual.   Spotting of blood in your stool or on  the toilet paper  .  IF YOU HAD POLYPS REMOVED DURING THE ENDOSCOPY:  Eat a soft diet IF YOU HAVE NAUSEA, BLOATING, ABDOMINAL PAIN, OR VOMITING.    FINDING OUT THE RESULTS OF YOUR TEST Not all test results are available during your visit. DR. Oneida Alar WILL CALL YOU WITHIN 14 DAYS OF YOUR PROCEDUE WITH YOUR RESULTS. Do not assume everything is normal if you have not heard from DR. FIELDS, CALL HER OFFICE AT [phone].  SEEK IMMEDIATE MEDICAL ATTENTION AND CALL THE OFFICE: [phone] IF:  You have more than a spotting of blood in your stool.   Your belly is swollen (abdominal distention).   You are nauseated or vomiting.   You have a temperature over 101F.   You have abdominal pain or discomfort that is severe or gets worse throughout the day.  Polyps, Colon  A polyp is extra tissue that grows inside your body. Colon polyps grow in the large intestine. The large intestine, also called the colon, is part of your digestive system. It is a long, hollow tube at the end of your digestive tract where your body makes and stores stool. Most polyps are not dangerous. They are benign. This means they are not cancerous. But over time, some types of polyps can turn into cancer. Polyps that are smaller than a pea are usually not harmful. But larger polyps could someday become or may already be cancerous. To be safe, doctors remove all polyps  and test them.    PREVENTION There is not one sure way to prevent polyps. You might be able to lower your risk of getting them if you:  Eat more fruits and vegetables and less fatty food.   Do not smoke.   Avoid alcohol.   Exercise every day.   Lose weight if you are overweight.   Eating more calcium and folate can also lower your risk of getting polyps. Some foods that are rich in calcium are milk, cheese, and broccoli. Some foods that are rich in folate are chickpeas, kidney beans, and spinach.     Lifestyle and home remedies TO CONTROL REFLUX  AND HEARTBURN You may eliminate or reduce the frequency of heartburn by making the following lifestyle changes:   Control your weight. Being overweight is a major risk factor for heartburn and GERD. Excess pounds put pressure on your abdomen, pushing up your stomach and causing acid to back up into your esophagus.    Eat smaller meals. 4 TO 6 MEALS A DAY. This reduces pressure on the lower esophageal sphincter, helping to prevent the valve from opening and acid from washing back into your esophagus.    Loosen your belt. Clothes that fit tightly around your waist put pressure on your abdomen and the lower esophageal sphincter.    Eliminate heartburn triggers. Everyone has specific triggers. Common triggers such as fatty or fried foods, spicy food, tomato sauce, carbonated beverages, alcohol, chocolate, mint, garlic, onion, caffeine and nicotine may make heartburn worse.    Avoid stooping or bending. Tying your shoes is OK. Bending over for longer periods to weed your garden isn't, especially soon after eating.    Don't lie down after a meal. Wait at least three to four hours after eating before going to bed, and don't lie down right after eating.    Alternative medicine  Several home remedies exist for treating GERD, but they provide only temporary relief. They include drinking baking soda (sodium bicarbonate) added to water or drinking other fluids such as baking soda mixed with cream of tartar and water.   Although these liquids create temporary relief by neutralizing, washing away or buffering acids, eventually they aggravate the situation by adding gas and fluid to your stomach, increasing pressure and causing more acid reflux. Further, adding more sodium to your diet may increase your blood pressure and add stress to your heart, and excessive bicarbonate ingestion can alter the acid-base balance in your body.

## 2016-08-12 NOTE — H&P (Signed)
Primary Care Physician:  Pablo Ledger, MD Primary Gastroenterologist:  Dr. Oneida Alar  Pre-Procedure History & Physical: HPI:  [name] is a 76 y.o. male here for DYSPEPSIA/screening.  Past Medical History:  Diagnosis Date  . Arthritis   . CKD (chronic kidney disease), stage II   . Depression   . DVT of upper extremity (deep vein thrombosis) ([place]) 2012   Right arm  . Dyslipidemia   . Essential hypertension, benign   . Gout    Right foot  . Nonischemic dilated cardiomyopathy (HCC)    EF 35-40%, Cath 10/4 showed normal coronaries, EF 40-45%  . Obstructive sleep apnea    no CPAP.   Marland Kitchen Type 2 diabetes mellitus (HCC)    borderline per patient   . Ventricular tachycardia ([place])    s/p Medtronic single chamber ICD 10/27/11    Past Surgical History:  Procedure Laterality Date  . APPENDECTOMY  1961  . BUNIONECTOMY     Right foot  . CARDIAC DEFIBRILLATOR PLACEMENT  10/27/11   MDT Sim Boast XT VR ICD implanted by Dr Caryl Comes for VT  . CARPAL TUNNEL RELEASE     Right hand  . CATARACT EXTRACTION W/ INTRAOCULAR LENS  IMPLANT, BILATERAL    . IMPLANTABLE CARDIOVERTER DEFIBRILLATOR IMPLANT N/A 10/27/2011   Procedure: IMPLANTABLE CARDIOVERTER DEFIBRILLATOR IMPLANT;  Surgeon: Deboraha Sprang, MD;  Location: [facility] CATH LAB;  Service: Cardiovascular;  Laterality: N/A;  . JOINT REPLACEMENT    . LEFT HEART CATHETERIZATION WITH CORONARY ANGIOGRAM N/A 12/13/2010   Procedure: LEFT HEART CATHETERIZATION WITH CORONARY ANGIOGRAM;  Surgeon: Hillary Bow, MD;  Location: [facility] CATH LAB;  Service: Cardiovascular;  Laterality: N/A;  . LEFT HEART CATHETERIZATION WITH CORONARY ANGIOGRAM N/A 10/24/2011   Procedure: LEFT HEART CATHETERIZATION WITH CORONARY ANGIOGRAM;  Surgeon: Thayer Headings, MD;  Location: [facility] CATH LAB;  Service: Cardiovascular;  Laterality: N/A;  . RIGHT HEART CATHETERIZATION Right 12/13/2010   Procedure: RIGHT HEART CATH;  Surgeon: Hillary Bow, MD;  Location: [facility] CATH LAB;  Service:  Cardiovascular;  Laterality: Right;  . TONSILLECTOMY AND ADENOIDECTOMY  ~ 1950  . TOTAL HIP ARTHROPLASTY     Left  . TOTAL KNEE ARTHROPLASTY     Right    Prior to Admission medications   Medication Sig Start Date End Date Taking? Authorizing Provider  acetaminophen (TYLENOL) 500 MG tablet Take 1,000 mg by mouth every 6 (six) hours as needed for mild pain or fever.    Yes [provider]  allopurinol (ZYLOPRIM) 100 MG tablet Take 200 mg by mouth daily. FOR GOUT UNTIL 08/08/16   Yes [provider]  alum & mag hydroxide-simeth (MYLANTA) 200-200-20 MG/5ML suspension Take 15 mLs by mouth every 2 (two) hours as needed for indigestion.    Yes [provider]  amiodarone (PACERONE) 100 MG tablet Take 50 mg by mouth daily.   Yes [provider]  amLODipine (NORVASC) 2.5 MG tablet Take 1 tablet (2.5 mg total) by mouth daily. 05/21/16 08/19/16 Yes Satira Sark, MD  aspirin (ASPIR-81) 81 MG EC tablet Take 81 mg by mouth daily.     Yes [provider]  bisacodyl (DULCOLAX) 5 MG EC tablet Take 10 mg by mouth See admin instructions. Starting 07/16/16 take 2 tablets (10 mg) in the afternoon for colonoscopy until 08/11/16   Yes [provider]  calcitRIOL (ROCALTROL) 0.25 MCG capsule Take 0.25 mcg by mouth every Monday, Wednesday, and Friday. M,W,F    Yes [provider]  calcium acetate (PHOSLO) 667 MG capsule Take 667 mg by mouth 3 (three) times daily before meals.   Yes [provider]  carvedilol (COREG) 25 MG tablet Take 25 mg by mouth 2 (two) times daily with a meal.    Yes [provider]  cinacalcet (SENSIPAR) 30 MG tablet Take 1 tablet (30 mg total) by mouth daily with breakfast. 03/06/16  Yes Nida, Marella Chimes, MD  Colchicine 0.6 MG CAPS Take 0.6 mg by mouth 2 (two) times daily.   Yes [provider]  Dextromethorphan-Guaifenesin (ROBITUSSIN DM) 10-100 MG/5ML liquid Take 10 mLs by mouth every 4 (four) hours  as needed (cough).   Yes [provider]  DULoxetine (CYMBALTA) 20 MG capsule Take 20 mg by mouth daily.   Yes [provider]  finasteride (PROSCAR) 5 MG tablet Take 5 mg by mouth daily.   Yes [provider]  fluticasone (FLONASE) 50 MCG/ACT nasal spray Place 2 sprays into the nose daily. For dry nose  01/02/11 08/02/17 Yes de Stanford Scotland, MD  HYDROcodone-acetaminophen (NORCO/VICODIN) 5-325 MG tablet Take 1 tablet by mouth every 12 (twelve) hours as needed for moderate pain.   Yes [provider]  indomethacin (INDOCIN) 50 MG capsule Take 50 mg by mouth every 8 (eight) hours as needed (ACUTE GOUT).   Yes [provider]  ipratropium-albuterol (DUONEB) 0.5-2.5 (3) MG/3ML SOLN Take 3 mLs by nebulization every 6 (six) hours as needed (SHORTNESS OF BREATH, COUGH AND CONGESTION).   Yes [provider]  lisinopril (PRINIVIL,ZESTRIL) 20 MG tablet Take 40 mg by mouth daily.   Yes [provider]  loperamide (IMODIUM A-D) 2 MG tablet Take 2-4 mg by mouth every 4 (four) hours as needed for diarrhea or loose stools. GIVE 2 TABLETS INITIAL DOSE THEN 1 TABLET AFTER EACH LOOSE STOOL  (MAX 4 TABLETS IN 24 HOUR PERIOD)   Yes [provider]  magnesium hydroxide (MILK OF MAGNESIA) 400 MG/5ML suspension Take 30 mLs by mouth daily as needed for mild constipation.   Yes [provider]  nitroGLYCERIN (NITROSTAT) 0.4 MG SL tablet Place 0.4 mg under the tongue every 5 (five) minutes as needed for chest pain.   Yes [provider]  pantoprazole (PROTONIX) 40 MG tablet Take 40 mg by mouth 2 (two) times daily.   Yes [provider]  simethicone (MYLICON) 80 MG chewable tablet Chew 80 mg by mouth every 6 (six) hours as needed for flatulence.   Yes [provider]  tamsulosin (FLOMAX) 0.4 MG CAPS capsule Take 0.4 mg by mouth every evening.    Yes [provider]  torsemide (DEMADEX) 20 MG tablet Take 2 tablets  (40 mg total) by mouth daily. 05/08/16  Yes Satira Sark, MD    Allergies as of 07/01/2016 - Review Complete 06/25/2016  Allergen Reaction Noted  . Codeine Other (See Comments) 12/11/2010  . Milk-related compounds  10/23/2011  . Warfarin sodium  11/18/2007    Family History  Problem Relation Age of Onset  . Heart disease Brother   . Liver cancer Father   . Colon cancer Neg Hx   . Colon polyps Neg Hx     Social History   Social History  . Marital status: Single    Spouse name: N/A  . Number of children: N/A  . Years of education: N/A   Occupational History  . Not on file.   Social History Main Topics  . Smoking status: Former Smoker  Packs/day: 1.00    Years: 15.00    Types: Cigarettes    Quit date: 01/20/1982  . Smokeless tobacco: Never Used     Comment: stopped smoking cigarettes 1984  . Alcohol use No     Comment: "stopped drinking alcohol 1984"  . Drug use: No  . Sexual activity: No   Other Topics Concern  . Not on file   Social History Narrative  . No narrative on file    Review of Systems: See HPI, otherwise negative ROS   Physical Exam: There were no vitals taken for this visit. General:   Alert,  pleasant and cooperative in NAD Head:  Normocephalic and atraumatic. Neck:  Supple; Lungs:  Clear throughout to auscultation.    Heart:  Regular rate and rhythm. Abdomen:  Soft, nontender and nondistended. Normal bowel sounds, without guarding, and without rebound.   Neurologic:  Alert and  oriented x4;  grossly normal neurologically.  Impression/Plan:    DYSPEPSIA/screening  PLAN:  EGD/TCSTODAY. DISCUSSED PROCEDURE, BENEFITS, & RISKS: < 1% chance of medication reaction, bleeding, perforation, or rupture of spleen/liver.

## 2016-08-12 NOTE — Op Note (Signed)
[facility] Patient Name: [name] Procedure Date: 08/12/2016 7:12 AM MRN: [id number] Date of Birth: [birth year] Attending MD: Barney Drain , MD CSN: [id number] Age: 76 Admit Type: Outpatient Procedure:                Colonoscopy WITH COLD SNARE/FORCEPS POLYPECTOMY Indications:              Screening for colorectal malignant neoplasm Providers:                Barney Drain, MD, Hinton Rao, RN, Aram Candela Referring MD:             Veneta Penton Medicines:                Propofol per Anesthesia Complications:            No immediate complications. Estimated Blood Loss:     Estimated blood loss was minimal. Procedure:                Pre-Anesthesia Assessment:                           - Prior to the procedure, a History and Physical                            was performed, and patient medications and                            allergies were reviewed. The patient's tolerance of                            previous anesthesia was also reviewed. The risks                            and benefits of the procedure and the sedation                            options and risks were discussed with the patient.                            All questions were answered, and informed consent                            was obtained. Prior Anticoagulants: The patient has                            taken no previous anticoagulant or antiplatelet                            agents. ASA Grade Assessment: III - A patient with                            severe systemic disease. After reviewing the risks                            and benefits, the patient was deemed in  satisfactory condition to undergo the procedure.                            After obtaining informed consent, the colonoscope                            was passed under direct vision. Throughout the                            procedure, the patient's blood pressure, pulse, and       oxygen saturations were monitored continuously. The                            EC-[id number] ([id number]) scope was introduced through                            the anus and advanced to the the cecum, identified                            by appendiceal orifice and ileocecal valve. The                            colonoscopy was somewhat difficult due to a                            tortuous colon. Successful completion of the                            procedure was aided by COLOWRAP. The patient                            tolerated the procedure well. The quality of the                            bowel preparation was good. The ileocecal valve,                            appendiceal orifice, and rectum were photographed. Scope In: 7:52:44 AM Scope Out: 8:14:53 AM Scope Withdrawal Time: 0 hours 18 minutes 57 seconds  Total Procedure Duration: 0 hours 22 minutes 9 seconds  Findings:      A 6 mm polyp was found in the proximal ascending colon. The polyp was       sessile. The polyp was removed with a cold snare. Resection and       retrieval were complete.      A 4 mm polyp was found in the hepatic flexure. The polyp was sessile.       The polyp was removed with a cold biopsy forceps. Resection and       retrieval were complete.      Multiple small and large-mouthed diverticula were found in the       recto-sigmoid colon, sigmoid colon, descending colon, transverse colon       and hepatic flexure.      External and internal hemorrhoids were found during retroflexion. The  hemorrhoids were large. Impression:               - One 6 mm polyp in the proximal ascending colon,                            removed with a cold snare. Resected and retrieved.                           - One 4 mm polyp at the hepatic flexure, removed                            with a cold biopsy forceps. Resected and retrieved.                           - Diverticulosis in the recto-sigmoid colon, in the                             sigmoid colon, in the descending colon, in the                            transverse colon and at the hepatic flexure.                           - External and internal hemorrhoids. Moderate Sedation:      Per Anesthesia Care Recommendation:           - Await pathology results.                           - Repeat colonoscopy in 5-10 years for surveillance.                           - Continue present medications.                           - High fiber diet.                           - Patient has a contact number available for                            emergencies. The signs and symptoms of potential                            delayed complications were discussed with the                            patient. Return to normal activities tomorrow.                            Written discharge instructions were provided to the                            patient. Procedure Code(s):        --- Professional ---  45385, Colonoscopy, flexible; with removal of                            tumor(s), polyp(s), or other lesion(s) by snare                            technique                           45380, 59, Colonoscopy, flexible; with biopsy,                            single or multiple Diagnosis Code(s):        --- Professional ---                           Z12.11, Encounter for screening for malignant                            neoplasm of colon                           D12.2, Benign neoplasm of ascending colon                           D12.3, Benign neoplasm of transverse colon (hepatic                            flexure or splenic flexure)                           K64.8, Other hemorrhoids                           K57.30, Diverticulosis of large intestine without                            perforation or abscess without bleeding CPT copyright 2016 American [employer] Association. All rights reserved. The codes documented in this report are preliminary  and upon coder review may  be revised to meet current compliance requirements. Barney Drain, MD Barney Drain, MD 08/12/2016 8:36:58 AM This report has been signed electronically. Number of Addenda: 0

## 2016-08-12 NOTE — Anesthesia Preprocedure Evaluation (Signed)
Anesthesia Evaluation  Patient identified by MRN, date of birth, ID band Patient awake    Reviewed: Allergy & Precautions, NPO status , Patient's Chart, lab work & pertinent test results  Airway Mallampati: III  TM Distance: >3 FB Neck ROM: Full    Dental  (+) Teeth Intact   Pulmonary shortness of breath, with exertion, at rest, lying and Long-Term Oxygen Therapy, sleep apnea (not using CPAP, O2 at nite.) , former smoker,   SOB lying in bed  breath sounds clear to auscultation       Cardiovascular hypertension, Pt. on medications + Peripheral Vascular Disease, +CHF, + DOE and + DVT  + dysrhythmias Ventricular Tachycardia + Cardiac Defibrillator  Rhythm:Regular Rate:Normal     Neuro/Psych PSYCHIATRIC DISORDERS Depression    GI/Hepatic GERD  ,  Endo/Other  diabetes, Type 2Morbid obesity  Renal/GU Renal disease     Musculoskeletal   Abdominal   Peds  Hematology   Anesthesia Other Findings   Reproductive/Obstetrics                             Anesthesia Physical Anesthesia Plan  ASA: IV  Anesthesia Plan: MAC   Post-op Pain Management:    Induction: Intravenous  PONV Risk Score and Plan:   Airway Management Planned: Simple Face Mask  Additional Equipment:   Intra-op Plan:   Post-operative Plan:   Informed Consent: I have reviewed the patients History and Physical, chart, labs and discussed the procedure including the risks, benefits and alternatives for the proposed anesthesia with the patient or authorized representative who has indicated his/her understanding and acceptance.     Plan Discussed with:   Anesthesia Plan Comments:         Anesthesia Quick Evaluation

## 2016-08-13 ENCOUNTER — Ambulatory Visit
Admission: RE | Admit: 2016-08-13 | Discharge: 2016-08-13 | Disposition: A | Discharge status: Home / Self Care | Payer: Medicare Other | Source: Ambulatory Visit | Attending: Neurology | Admitting: Neurology

## 2016-08-13 ENCOUNTER — Telehealth: Payer: Self-pay | Admitting: Gastroenterology

## 2016-08-13 DIAGNOSIS — M545 Low back pain: Principal | ICD-10-CM

## 2016-08-13 DIAGNOSIS — G8929 Other chronic pain: Secondary | ICD-10-CM

## 2016-08-13 MED ORDER — METHYLPREDNISOLONE ACETATE 40 MG/ML INJ SUSP (RADIOLOG
120.0000 mg | Freq: Once | INTRAMUSCULAR | Status: AC
  Administered 2016-08-13: 120 mg via EPIDURAL

## 2016-08-13 MED ORDER — IOPAMIDOL (ISOVUE-M 200) INJECTION 41%
1.0000 mL | Freq: Once | INTRAMUSCULAR | Status: AC
  Administered 2016-08-13: 1 mL via EPIDURAL

## 2016-08-13 NOTE — Discharge Instructions (Signed)

## 2016-08-13 NOTE — Telephone Encounter (Signed)
Called and informed Wilmer Floor at the [facility] and faxing this print off to her at  [phone].

## 2016-08-13 NOTE — Telephone Encounter (Addendum)
Please call pt. HE had TWO simple adenomas removed. Please call pt. His stomach Bx shows gastritis DUE TO ASA USE.  FOLLOW A HIGH FIBER DIET. CONTINUE PROTONIX. TAKE 30 MINUTES PRIOR TO MEALS TWICE DAILY. NEXT TCS IN 10 YEARS IF THE BENEFITS OUTWEIGH THE RISKS.

## 2016-08-14 ENCOUNTER — Encounter (HOSPITAL_COMMUNITY): Payer: Self-pay | Admitting: Gastroenterology

## 2016-08-14 NOTE — Telephone Encounter (Signed)
Reminder in epic °

## 2016-08-19 NOTE — Progress Notes (Signed)
Cardiology Office Note  Date: 08/20/2016   ID: Mark, [name] 1940-02-28, MRN [id number]  PCP: Pablo Ledger, MD  Primary Cardiologist: Rozann Lesches, MD   Chief Complaint  Patient presents with  . Cardiomyopathy    History of Present Illness: [name] is a 76 y.o. male last seen in May. He continues to reside at the [facility]. He is here for a follow-up visit today, states that he feels okay, he has changed his diet, more fiber related to diverticulitis. Reports no palpitations or chest pain. He is wearing compression stockings on his legs, has noticed some increased fluid.  I reviewed his medications. Previously he had been on Demadex at 40 mg daily, this was cut back to 20 mg every other day by Dr. Lowanda Foster. Last creatinine that I have in the chart is 2.48 from July 8, it had previously been 1.78 in May. Creatinine has been as high as 2.99 in December 2017. His weight is up 8 pounds compared to last visit.  He continues to follow in the device clinic with Dr. Rayann Heman, Medtronic ICD in place. Thoracic impedance from July indicated fluid accumulation since mid July. He has had no device shocks or syncope.  Past Medical History:  Diagnosis Date  . Arthritis   . CKD (chronic kidney disease), stage II   . Depression   . DVT of upper extremity (deep vein thrombosis) ([place]) 2012   Right arm  . Dyslipidemia   . Essential hypertension, benign   . Gout    Right foot  . Nonischemic dilated cardiomyopathy (HCC)    EF 35-40%, Cath 10/4 showed normal coronaries, EF 40-45%  . Obstructive sleep apnea    no CPAP.   Marland Kitchen Type 2 diabetes mellitus (HCC)    borderline per patient   . Ventricular tachycardia ([place])    s/p Medtronic single chamber ICD 10/27/11    Past Surgical History:  Procedure Laterality Date  . APPENDECTOMY  1961  . BUNIONECTOMY     Right foot  . CARDIAC DEFIBRILLATOR PLACEMENT  10/27/11   MDT Sim Boast XT VR ICD implanted by Dr Caryl Comes for VT  .  CARPAL TUNNEL RELEASE     Right hand  . CATARACT EXTRACTION W/ INTRAOCULAR LENS  IMPLANT, BILATERAL    . COLONOSCOPY WITH PROPOFOL N/A 08/12/2016   Procedure: COLONOSCOPY WITH PROPOFOL;  Surgeon: Danie Binder, MD;  [facility];  Service: Endoscopy;  Laterality: N/A;  7:30am  . ESOPHAGOGASTRODUODENOSCOPY (EGD) WITH PROPOFOL N/A 08/12/2016   Procedure: ESOPHAGOGASTRODUODENOSCOPY (EGD) WITH PROPOFOL;  Surgeon: Danie Binder, MD;  Location: [facility];  Service: Endoscopy;  Laterality: N/A;  . IMPLANTABLE CARDIOVERTER DEFIBRILLATOR IMPLANT N/A 10/27/2011   Procedure: IMPLANTABLE CARDIOVERTER DEFIBRILLATOR IMPLANT;  Surgeon: Deboraha Sprang, MD;  Location: [facility] CATH LAB;  Service: Cardiovascular;  Laterality: N/A;  . LEFT HEART CATHETERIZATION WITH CORONARY ANGIOGRAM N/A 12/13/2010   Procedure: LEFT HEART CATHETERIZATION WITH CORONARY ANGIOGRAM;  Surgeon: Hillary Bow, MD;  Location: [facility] CATH LAB;  Service: Cardiovascular;  Laterality: N/A;  . LEFT HEART CATHETERIZATION WITH CORONARY ANGIOGRAM N/A 10/24/2011   Procedure: LEFT HEART CATHETERIZATION WITH CORONARY ANGIOGRAM;  Surgeon: Thayer Headings, MD;  Location: [facility] CATH LAB;  Service: Cardiovascular;  Laterality: N/A;  . POLYPECTOMY  08/12/2016   Procedure: POLYPECTOMY;  Surgeon: Danie Binder, MD;  Location: [facility];  Service: Endoscopy;;  cold snare  . RIGHT HEART CATHETERIZATION Right 12/13/2010   Procedure: RIGHT HEART  CATH;  Surgeon: Hillary Bow, MD;  Location: [facility] CATH LAB;  Service: Cardiovascular;  Laterality: Right;  . TONSILLECTOMY AND ADENOIDECTOMY  ~ 1950  . TOTAL HIP ARTHROPLASTY     Left  . TOTAL KNEE ARTHROPLASTY     Right    Current Outpatient Prescriptions  Medication Sig Dispense Refill  . acetaminophen (TYLENOL) 500 MG tablet Take 1,000 mg by mouth every 6 (six) hours as needed for mild pain or fever.     Marland Kitchen allopurinol (ZYLOPRIM) 300 MG tablet Take 300 mg by mouth daily.    Marland Kitchen alum & mag  hydroxide-simeth (MYLANTA) 200-200-20 MG/5ML suspension Take 15 mLs by mouth every 2 (two) hours as needed for indigestion.     Marland Kitchen amiodarone (PACERONE) 100 MG tablet Take 50 mg by mouth every other day.     Marland Kitchen amLODipine (NORVASC) 2.5 MG tablet Take 1 tablet (2.5 mg total) by mouth daily. 180 tablet 3  . aspirin (ASPIR-81) 81 MG EC tablet Take 81 mg by mouth daily.      . calcium acetate (PHOSLO) 667 MG capsule Take 667 mg by mouth 3 (three) times daily before meals.    . carvedilol (COREG) 25 MG tablet Take 25 mg by mouth 2 (two) times daily with a meal.     . cinacalcet (SENSIPAR) 30 MG tablet Take 1 tablet (30 mg total) by mouth daily with breakfast. 30 tablet 3  . Colchicine 0.6 MG CAPS Take 0.6 mg by mouth 2 (two) times daily.    Marland Kitchen Dextromethorphan-Guaifenesin (ROBITUSSIN DM) 10-100 MG/5ML liquid Take 10 mLs by mouth every 4 (four) hours as needed (cough).    . DULoxetine (CYMBALTA) 20 MG capsule Take 20 mg by mouth daily.    . finasteride (PROSCAR) 5 MG tablet Take 5 mg by mouth daily.    . fluticasone (FLONASE) 50 MCG/ACT nasal spray Place 2 sprays into the nose daily. For dry nose     . HYDROcodone-acetaminophen (NORCO/VICODIN) 5-325 MG tablet Take 1 tablet by mouth every 12 (twelve) hours as needed for moderate pain.    . indomethacin (INDOCIN) 50 MG capsule Take 50 mg by mouth every 8 (eight) hours as needed (ACUTE GOUT).    Marland Kitchen ipratropium-albuterol (DUONEB) 0.5-2.5 (3) MG/3ML SOLN Take 3 mLs by nebulization every 6 (six) hours as needed (SHORTNESS OF BREATH, COUGH AND CONGESTION).    Marland Kitchen lisinopril (PRINIVIL,ZESTRIL) 20 MG tablet Take 40 mg by mouth daily.    Marland Kitchen loperamide (IMODIUM A-D) 2 MG tablet Take 2-4 mg by mouth every 4 (four) hours as needed for diarrhea or loose stools. GIVE 2 TABLETS INITIAL DOSE THEN 1 TABLET AFTER EACH LOOSE STOOL  (MAX 4 TABLETS IN 24 HOUR PERIOD)    . magnesium hydroxide (MILK OF MAGNESIA) 400 MG/5ML suspension Take 30 mLs by mouth daily as needed for mild  constipation.    . nitroGLYCERIN (NITROSTAT) 0.4 MG SL tablet Place 0.4 mg under the tongue every 5 (five) minutes as needed for chest pain.    . nortriptyline (PAMELOR) 50 MG capsule Take 50 mg by mouth at bedtime.    . pantoprazole (PROTONIX) 40 MG tablet Take 40 mg by mouth 2 (two) times daily.    . simethicone (MYLICON) 80 MG chewable tablet Chew 80 mg by mouth every 6 (six) hours as needed for flatulence.    . tamsulosin (FLOMAX) 0.4 MG CAPS capsule Take 0.4 mg by mouth every evening.     . triamcinolone cream (KENALOG) 0.1 % Apply 1  application topically 2 (two) times daily as needed.    . torsemide (DEMADEX) 20 MG tablet Take 1 tablet (20 mg total) by mouth once. 90 tablet 0   No current facility-administered medications for this visit.    Allergies:  Codeine; Milk-related compounds; and Warfarin sodium   Social History: The patient  reports that he quit smoking about 34 years ago. His smoking use included Cigarettes. He has a 15.00 pack-year smoking history. He has never used smokeless tobacco. He reports that he does not drink alcohol or use drugs.   ROS:  Please see the history of present illness. Otherwise, complete review of systems is positive for intermittent leg edema.  All other systems are reviewed and negative.   Physical Exam: VS:  BP 100/70   Pulse 74   Ht 6' (1.829 m)   Wt 299 lb 6.4 oz (135.8 kg)   SpO2 98%   BMI 40.61 kg/m , BMI Body mass index is 40.61 kg/m.  Wt Readings from Last 3 Encounters:  08/20/16 299 lb 6.4 oz (135.8 kg)  08/06/16 291 lb (132 kg)  06/25/16 291 lb 9.6 oz (132.3 kg)    Morbidly obese male in no acute distress. Seated in wheelchair. HEENT: Conjunctiva and lids normal, oropharynx clear.  Neck: Supple, no obvious elevated JVP with increased girth, no carotid bruits, no thyromegaly.  Lungs: Clear to auscultation, diminished throughout, nonlabored breathing at rest.  Thorax: Stable device pocket site.  Cardiac: Regular rate and  rhythm, no S3, distant heart sounds, no pericardial rub.  Abdomen: Soft, nontender, bowel sounds present.  Extremities: Compression stockings in place, distal pulses 2+.  Skin: Warm and dry. Musculoskeletal: No kyphosis. Neuropsychiatric: Alert and oriented 3, affect appropriate.  ECG: I personally reviewed the tracing from 08/06/2016 which showed a paced ventricular rhythm with intermittent PVCs.  Recent Labwork: 08/06/2016: BUN 46; Creatinine, Ser 2.48; Hemoglobin 12.4; Platelets 116; Potassium 3.9; Sodium 139     Component Value Date/Time   CHOL 141 12/12/2010 0630   TRIG 114 12/12/2010 0630   HDL 31 (L) 12/12/2010 0630   CHOLHDL 4.5 12/12/2010 0630   VLDL 23 12/12/2010 0630   LDLCALC 87 12/12/2010 0630    Other Studies Reviewed Today:  Echocardiogram 03/17/2012: Study Conclusions  - Left ventricle: The cavity size was moderately dilated. Wall thickness was increased in a pattern of mild LVH. Systolic function was mildly to moderately reduced. The estimated ejection fraction was in the range of 40% to 45%. Diffuse hypokinesis - most prominent, nearly akineticin basal inferoposterior wall. Doppler parameters are consistent with abnormal left ventricular relaxation (grade 1 diastolic dysfunction). Unable to compare with previous study. - Mitral valve: Mild regurgitation. - Left atrium: The atrium was mildly to moderately dilated. - Right ventricle: Pacer wire or catheter noted in right ventricle. - Right atrium: The atrium was mildly dilated. - Tricuspid valve: Trivial regurgitation. - Pulmonary arteries: PA peak pressure: 70mm Hg (S). - Pericardium, extracardiac: There was no pericardial effusion.  Assessment and Plan:  1. 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 I have recommended that he increase Demadex to 20 mg daily.  Hopefully we can find a balance between keeping his fluid weight stable and not further worsening his chronic kidney disease.  2. Essential hypertension, blood pressure is normal today. Norvasc was reduced at the last visit.  3. 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  4. Medtronic ICD in place. No device shocks. Recent thoracic impedance measurements noted.  Current medicines were reviewed with the patient today.  Disposition: Follow-up in 3 months.  Signed, Satira Sark, MD, [facility] 08/20/2016 9:48 AM    [facility] [place], [place], [place] [address] Phone: [phone]; Fax: [phone]

## 2016-08-20 ENCOUNTER — Ambulatory Visit (INDEPENDENT_AMBULATORY_CARE_PROVIDER_SITE_OTHER): Payer: Medicare Other | Admitting: Cardiology

## 2016-08-20 ENCOUNTER — Encounter: Payer: Self-pay | Admitting: Cardiology

## 2016-08-20 VITALS — BP 100/70 | HR 74 | Ht 72.0 in | Wt 299.4 lb

## 2016-08-20 DIAGNOSIS — I5022 Chronic systolic (congestive) heart failure: Secondary | ICD-10-CM

## 2016-08-20 DIAGNOSIS — I428 Other cardiomyopathies: Secondary | ICD-10-CM

## 2016-08-20 DIAGNOSIS — N183 Chronic kidney disease, stage 3 unspecified: Secondary | ICD-10-CM

## 2016-08-20 DIAGNOSIS — I10 Essential (primary) hypertension: Secondary | ICD-10-CM

## 2016-08-20 MED ORDER — TORSEMIDE 20 MG PO TABS: 20.0000 mg | ORAL_TABLET | Freq: Once | ORAL | 0 refills | Status: AC

## 2016-08-20 NOTE — Patient Instructions (Signed)
Medication Instructions:  Your physician has recommended you make the following change in your medication:   Begin taking Demadex 20 mg daily  Please continue all other medication as prescribed   Labwork: NONE  Testing/Procedures: NONE  Follow-Up: Your physician recommends that you schedule a follow-up appointment in: [place]  Any Other Special Instructions Will Be Listed Below (If Applicable).  If you need a refill on your cardiac medications before your next appointment, please call your pharmacy.

## 2016-09-12 ENCOUNTER — Ambulatory Visit (INDEPENDENT_AMBULATORY_CARE_PROVIDER_SITE_OTHER): Payer: Medicare Other

## 2016-09-12 DIAGNOSIS — Z95810 Presence of automatic (implantable) cardiac defibrillator: Secondary | ICD-10-CM | POA: Diagnosis not present

## 2016-09-12 DIAGNOSIS — I5022 Chronic systolic (congestive) heart failure: Secondary | ICD-10-CM | POA: Diagnosis not present

## 2016-09-12 NOTE — Progress Notes (Signed)
EPIC Encounter for ICM Monitoring  Patient Name: [name] is a 76 y.o. male Date: 09/12/2016 Primary Care Physican: Pablo Ledger, MD PrimaryCardiologist:McDowell Electrophysiologist: Allred Nephrologist: Lynnette Caffey Dry Weight:299 lbs (at 08/20/16 office visit)       Attempted call to patient and unable to reach.  Transmission reviewed.    Thoracic impedance normal.  Prescribed dosage: Torsemide 20 mg 1 tablet (20 mg total) daily  Labs:  08/06/2016 Creatinine 2.48,  BUN 46, Potassium 3.9, Sodium 139, EGFR 24-28 05/22/2016 Creatinine 1.78,  BUN 42, Potassium 4.7, Sodium 141 05/05/2016 Creatinine 2.01, BUN 62, Potassium 3.9 - results obtained from nurse verbally and instructed to fax to Dr McDowell's office  01/16/2016 Creatinine 2.99, BUN 93, Potassium 4.1, Sodium 140  12/19/2015 Creatinine 2.67, BUN 71, Potassium 4.8, Sodium 142  08/05/2015 Creatinine 1.9, BUN 54, Potassium 5.2, Sodium 143, EGFR 35-42   Recommendations: NONE - Unable to reach patient   Follow-up plan: ICM clinic phone appointment on 10/13/2016.  Office appointment scheduled 11/21/2016 with Dr. Domenic Polite.  Copy of ICM check sent to Dr. Rayann Heman.   3 month ICM trend: 09/12/2016   1 Year ICM trend:      [name], RN 09/12/2016 9:48 AM

## 2016-10-20 DEATH — deceased

## 2016-11-21 ENCOUNTER — Ambulatory Visit: Payer: Medicare Other | Admitting: Cardiology

## 2016-12-08 ENCOUNTER — Ambulatory Visit: Payer: Medicare Other | Admitting: "Endocrinology

## 2016-12-26 ENCOUNTER — Ambulatory Visit: Payer: Medicare Other | Admitting: Gastroenterology

## 2017-01-23 ENCOUNTER — Encounter: Payer: Medicare Other | Admitting: Internal Medicine

## 2017-12-25 IMAGING — XA Imaging study
3 series · 4 of 4 positions shown · non-contrast
Comparison: none

CLINICAL DATA: Lumbosacral spondylosis without myelopathy.

[Series 1: ortho standard · 1 of 1 slices shown (1 of 3)]
[im 1/1]
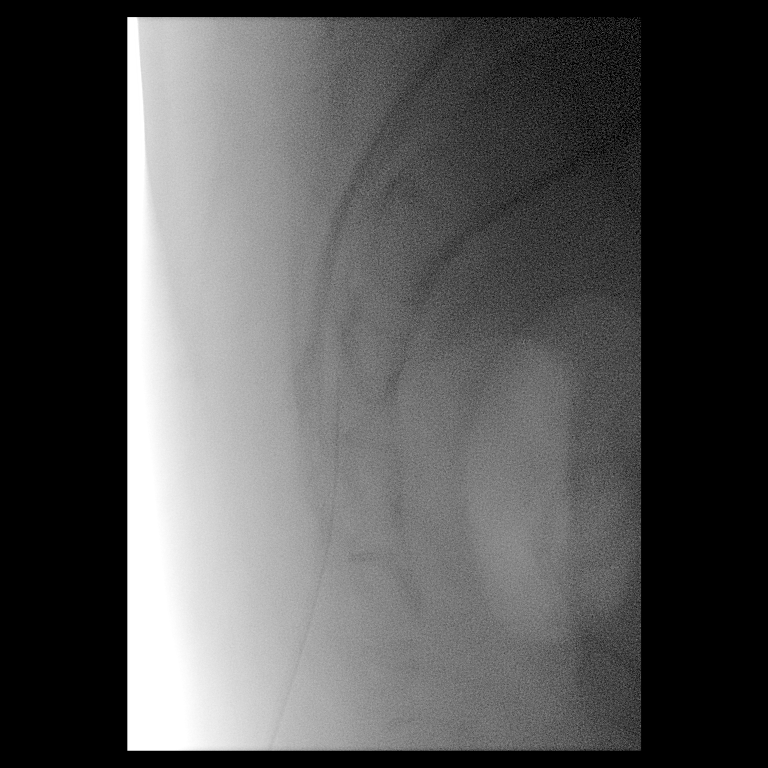

[Series 2: ortho standard · 1 of 1 slices shown (2 of 3)]
[im 1/1]
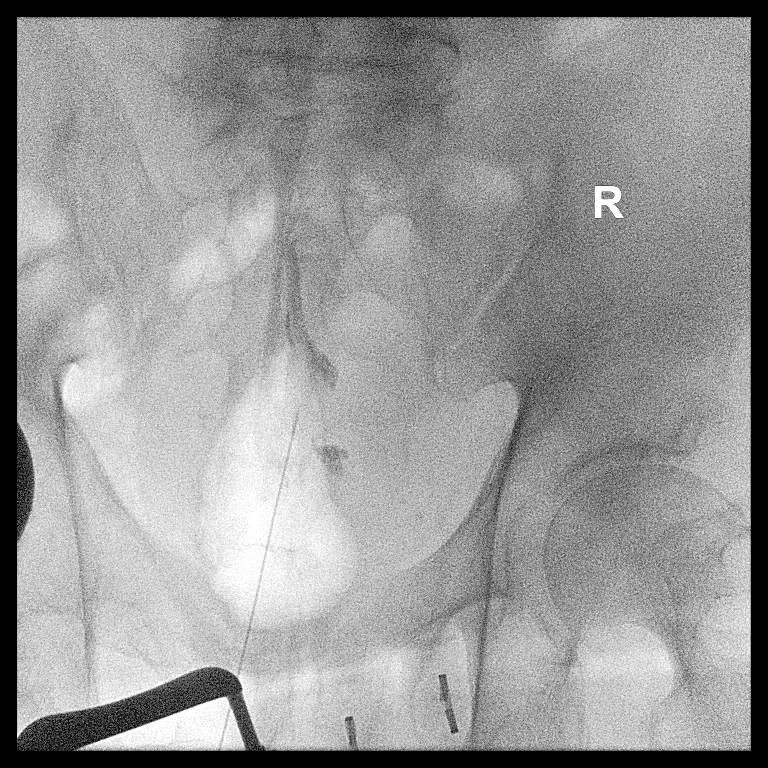

[Series 3: ortho standard · 2 of 2 slices shown (3 of 3)]
[im 1/2]
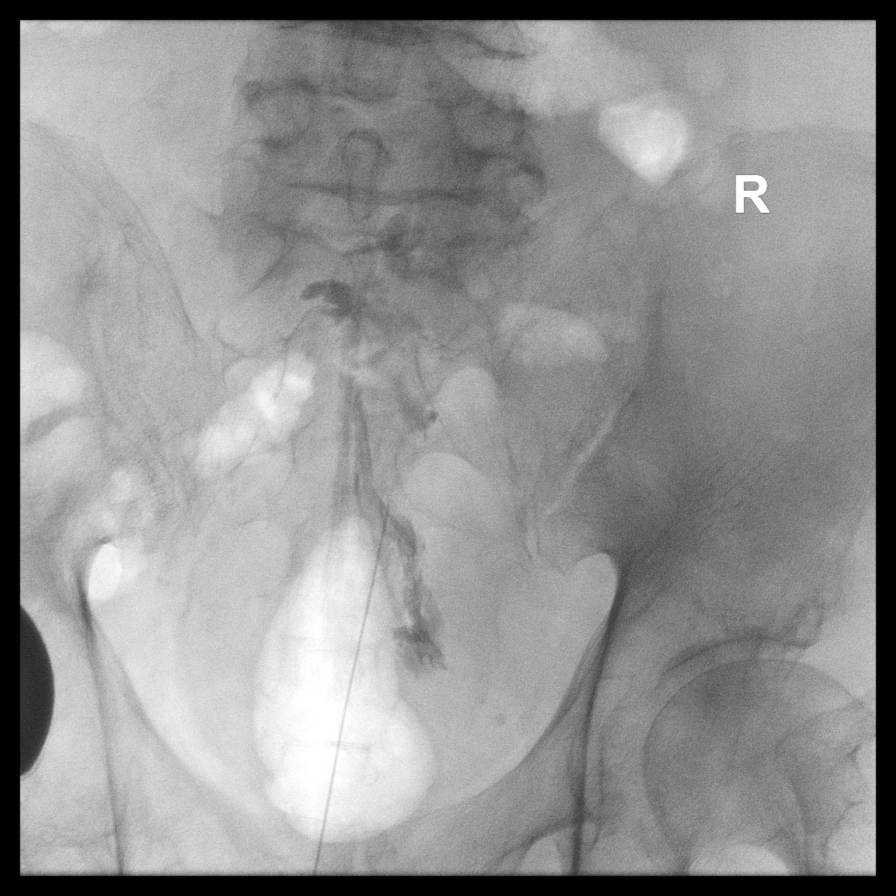
[im 2/2]
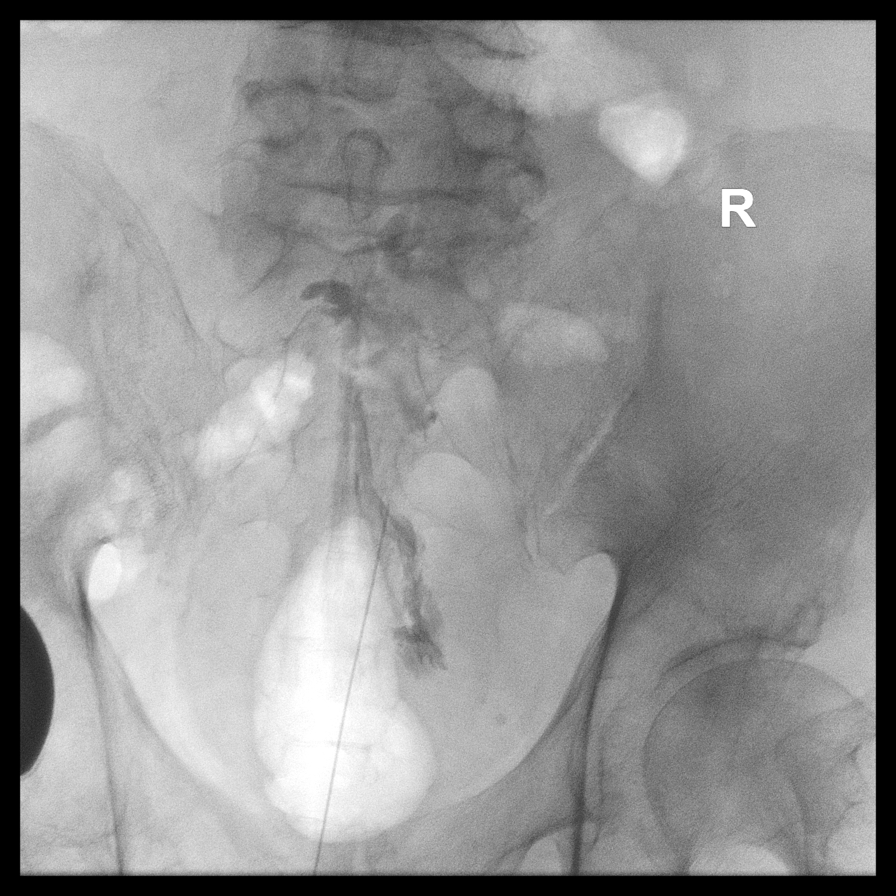

[4 of 4 positions shown; findings below may reference images not displayed]

FLUOROSCOPY TIME:  30 seconds corresponding to a Dose Area Product
of 122.4 ?Gy*m2

EXAM:
CAUDAL EPIDURAL INJECTION

Informed written consent was obtained.  Time-out was performed.

Utilizing a caudal approach, the skin overlying the sacral hiatus
was cleansed and anesthetized. A 20 gauge epidural needle was
advanced into the sacral epidural space. Injection of Isovue-M 200
shows a good epidural pattern with spread up to L5-S1. No vascular
opacification is seen.

120 mg of Depo-Medrol mixed with 3 ml of normal saline and 3 ml of
1% Lidocaine were instilled. The procedure was well-tolerated, and
the patient was discharged thirty minutes following the injection in
good condition.
IMPRESSION: Technically successful caudal epidural injection #1, series [DATE].
